# Patient Record
Sex: Male | Born: 1945 | Race: White | Hispanic: No | Smoking: Former smoker
Health system: Southern US, Community
[De-identification: ages and names within clinical notes are randomized; demographics above are authoritative.]

## PROBLEM LIST (undated history)

## (undated) DIAGNOSIS — K219 Gastro-esophageal reflux disease without esophagitis: Secondary | ICD-10-CM

## (undated) DIAGNOSIS — R001 Bradycardia, unspecified: Secondary | ICD-10-CM

## (undated) DIAGNOSIS — Z87442 Personal history of urinary calculi: Secondary | ICD-10-CM

## (undated) DIAGNOSIS — N200 Calculus of kidney: Secondary | ICD-10-CM

## (undated) DIAGNOSIS — F419 Anxiety disorder, unspecified: Secondary | ICD-10-CM

## (undated) DIAGNOSIS — I10 Essential (primary) hypertension: Secondary | ICD-10-CM

## (undated) DIAGNOSIS — E785 Hyperlipidemia, unspecified: Secondary | ICD-10-CM

## (undated) DIAGNOSIS — G629 Polyneuropathy, unspecified: Secondary | ICD-10-CM

## (undated) HISTORY — PX: INSERT / REPLACE / REMOVE PACEMAKER: SUR710

## (undated) HISTORY — DX: Calculus of kidney: N20.0

## (undated) HISTORY — PX: BACK SURGERY: SHX140

## (undated) HISTORY — PX: OTHER SURGICAL HISTORY: SHX169

## (undated) HISTORY — PX: JOINT REPLACEMENT: SHX530

## (undated) HISTORY — DX: Hyperlipidemia, unspecified: E78.5

## (undated) HISTORY — PX: EYE SURGERY: SHX253

---

## 1992-03-29 HISTORY — PX: APPENDECTOMY: SHX54

## 2005-12-03 ENCOUNTER — Emergency Department: Payer: Self-pay | Admitting: Emergency Medicine

## 2008-03-29 HISTORY — PX: SIGMOIDOSCOPY: SUR1295

## 2009-06-26 DIAGNOSIS — K219 Gastro-esophageal reflux disease without esophagitis: Secondary | ICD-10-CM | POA: Insufficient documentation

## 2009-06-26 DIAGNOSIS — L851 Acquired keratosis [keratoderma] palmaris et plantaris: Secondary | ICD-10-CM | POA: Insufficient documentation

## 2009-06-26 DIAGNOSIS — K644 Residual hemorrhoidal skin tags: Secondary | ICD-10-CM | POA: Insufficient documentation

## 2010-03-29 HISTORY — PX: CARPAL TUNNEL RELEASE: SHX101

## 2010-06-24 ENCOUNTER — Ambulatory Visit: Payer: Self-pay | Admitting: Specialist

## 2012-12-06 ENCOUNTER — Ambulatory Visit: Payer: Self-pay | Admitting: Gastroenterology

## 2012-12-06 LAB — HM COLONOSCOPY

## 2014-04-01 DIAGNOSIS — E780 Pure hypercholesterolemia: Secondary | ICD-10-CM | POA: Diagnosis not present

## 2014-04-01 DIAGNOSIS — Z0000 Encounter for general adult medical examination without abnormal findings: Secondary | ICD-10-CM | POA: Diagnosis not present

## 2014-04-01 DIAGNOSIS — Z23 Encounter for immunization: Secondary | ICD-10-CM | POA: Diagnosis not present

## 2014-04-01 DIAGNOSIS — Z125 Encounter for screening for malignant neoplasm of prostate: Secondary | ICD-10-CM | POA: Diagnosis not present

## 2014-04-25 DIAGNOSIS — M9903 Segmental and somatic dysfunction of lumbar region: Secondary | ICD-10-CM | POA: Diagnosis not present

## 2014-04-25 DIAGNOSIS — M5416 Radiculopathy, lumbar region: Secondary | ICD-10-CM | POA: Diagnosis not present

## 2014-04-25 DIAGNOSIS — M9905 Segmental and somatic dysfunction of pelvic region: Secondary | ICD-10-CM | POA: Diagnosis not present

## 2014-04-25 DIAGNOSIS — M955 Acquired deformity of pelvis: Secondary | ICD-10-CM | POA: Diagnosis not present

## 2014-04-26 DIAGNOSIS — M9905 Segmental and somatic dysfunction of pelvic region: Secondary | ICD-10-CM | POA: Diagnosis not present

## 2014-04-26 DIAGNOSIS — M9903 Segmental and somatic dysfunction of lumbar region: Secondary | ICD-10-CM | POA: Diagnosis not present

## 2014-04-26 DIAGNOSIS — M955 Acquired deformity of pelvis: Secondary | ICD-10-CM | POA: Diagnosis not present

## 2014-04-26 DIAGNOSIS — M5416 Radiculopathy, lumbar region: Secondary | ICD-10-CM | POA: Diagnosis not present

## 2014-04-27 DIAGNOSIS — M9905 Segmental and somatic dysfunction of pelvic region: Secondary | ICD-10-CM | POA: Diagnosis not present

## 2014-04-27 DIAGNOSIS — M9903 Segmental and somatic dysfunction of lumbar region: Secondary | ICD-10-CM | POA: Diagnosis not present

## 2014-04-27 DIAGNOSIS — M955 Acquired deformity of pelvis: Secondary | ICD-10-CM | POA: Diagnosis not present

## 2014-04-27 DIAGNOSIS — M5416 Radiculopathy, lumbar region: Secondary | ICD-10-CM | POA: Diagnosis not present

## 2014-04-29 DIAGNOSIS — M9903 Segmental and somatic dysfunction of lumbar region: Secondary | ICD-10-CM | POA: Diagnosis not present

## 2014-04-29 DIAGNOSIS — M5416 Radiculopathy, lumbar region: Secondary | ICD-10-CM | POA: Diagnosis not present

## 2014-04-29 DIAGNOSIS — M9905 Segmental and somatic dysfunction of pelvic region: Secondary | ICD-10-CM | POA: Diagnosis not present

## 2014-04-29 DIAGNOSIS — M955 Acquired deformity of pelvis: Secondary | ICD-10-CM | POA: Diagnosis not present

## 2014-05-01 DIAGNOSIS — M9903 Segmental and somatic dysfunction of lumbar region: Secondary | ICD-10-CM | POA: Diagnosis not present

## 2014-05-01 DIAGNOSIS — M955 Acquired deformity of pelvis: Secondary | ICD-10-CM | POA: Diagnosis not present

## 2014-05-01 DIAGNOSIS — M9905 Segmental and somatic dysfunction of pelvic region: Secondary | ICD-10-CM | POA: Diagnosis not present

## 2014-05-01 DIAGNOSIS — M5416 Radiculopathy, lumbar region: Secondary | ICD-10-CM | POA: Diagnosis not present

## 2014-05-03 DIAGNOSIS — M9903 Segmental and somatic dysfunction of lumbar region: Secondary | ICD-10-CM | POA: Diagnosis not present

## 2014-05-03 DIAGNOSIS — M5416 Radiculopathy, lumbar region: Secondary | ICD-10-CM | POA: Diagnosis not present

## 2014-05-03 DIAGNOSIS — M955 Acquired deformity of pelvis: Secondary | ICD-10-CM | POA: Diagnosis not present

## 2014-05-03 DIAGNOSIS — M9905 Segmental and somatic dysfunction of pelvic region: Secondary | ICD-10-CM | POA: Diagnosis not present

## 2014-05-08 DIAGNOSIS — M9903 Segmental and somatic dysfunction of lumbar region: Secondary | ICD-10-CM | POA: Diagnosis not present

## 2014-05-08 DIAGNOSIS — M9905 Segmental and somatic dysfunction of pelvic region: Secondary | ICD-10-CM | POA: Diagnosis not present

## 2014-05-08 DIAGNOSIS — M955 Acquired deformity of pelvis: Secondary | ICD-10-CM | POA: Diagnosis not present

## 2014-05-08 DIAGNOSIS — M5416 Radiculopathy, lumbar region: Secondary | ICD-10-CM | POA: Diagnosis not present

## 2014-05-09 DIAGNOSIS — M955 Acquired deformity of pelvis: Secondary | ICD-10-CM | POA: Diagnosis not present

## 2014-05-09 DIAGNOSIS — M9905 Segmental and somatic dysfunction of pelvic region: Secondary | ICD-10-CM | POA: Diagnosis not present

## 2014-05-09 DIAGNOSIS — M9903 Segmental and somatic dysfunction of lumbar region: Secondary | ICD-10-CM | POA: Diagnosis not present

## 2014-05-09 DIAGNOSIS — M5416 Radiculopathy, lumbar region: Secondary | ICD-10-CM | POA: Diagnosis not present

## 2014-05-10 DIAGNOSIS — M955 Acquired deformity of pelvis: Secondary | ICD-10-CM | POA: Diagnosis not present

## 2014-05-10 DIAGNOSIS — M9905 Segmental and somatic dysfunction of pelvic region: Secondary | ICD-10-CM | POA: Diagnosis not present

## 2014-05-10 DIAGNOSIS — M9903 Segmental and somatic dysfunction of lumbar region: Secondary | ICD-10-CM | POA: Diagnosis not present

## 2014-05-10 DIAGNOSIS — M5416 Radiculopathy, lumbar region: Secondary | ICD-10-CM | POA: Diagnosis not present

## 2014-05-14 DIAGNOSIS — M9903 Segmental and somatic dysfunction of lumbar region: Secondary | ICD-10-CM | POA: Diagnosis not present

## 2014-05-14 DIAGNOSIS — M9905 Segmental and somatic dysfunction of pelvic region: Secondary | ICD-10-CM | POA: Diagnosis not present

## 2014-05-14 DIAGNOSIS — M5416 Radiculopathy, lumbar region: Secondary | ICD-10-CM | POA: Diagnosis not present

## 2014-05-14 DIAGNOSIS — M955 Acquired deformity of pelvis: Secondary | ICD-10-CM | POA: Diagnosis not present

## 2014-05-16 DIAGNOSIS — M9903 Segmental and somatic dysfunction of lumbar region: Secondary | ICD-10-CM | POA: Diagnosis not present

## 2014-05-16 DIAGNOSIS — M955 Acquired deformity of pelvis: Secondary | ICD-10-CM | POA: Diagnosis not present

## 2014-05-16 DIAGNOSIS — M5416 Radiculopathy, lumbar region: Secondary | ICD-10-CM | POA: Diagnosis not present

## 2014-05-16 DIAGNOSIS — M9905 Segmental and somatic dysfunction of pelvic region: Secondary | ICD-10-CM | POA: Diagnosis not present

## 2014-05-21 DIAGNOSIS — M5416 Radiculopathy, lumbar region: Secondary | ICD-10-CM | POA: Diagnosis not present

## 2014-05-21 DIAGNOSIS — M955 Acquired deformity of pelvis: Secondary | ICD-10-CM | POA: Diagnosis not present

## 2014-05-21 DIAGNOSIS — M9905 Segmental and somatic dysfunction of pelvic region: Secondary | ICD-10-CM | POA: Diagnosis not present

## 2014-05-21 DIAGNOSIS — M9903 Segmental and somatic dysfunction of lumbar region: Secondary | ICD-10-CM | POA: Diagnosis not present

## 2014-05-23 DIAGNOSIS — M5416 Radiculopathy, lumbar region: Secondary | ICD-10-CM | POA: Diagnosis not present

## 2014-05-23 DIAGNOSIS — M955 Acquired deformity of pelvis: Secondary | ICD-10-CM | POA: Diagnosis not present

## 2014-05-23 DIAGNOSIS — M9903 Segmental and somatic dysfunction of lumbar region: Secondary | ICD-10-CM | POA: Diagnosis not present

## 2014-05-23 DIAGNOSIS — M9905 Segmental and somatic dysfunction of pelvic region: Secondary | ICD-10-CM | POA: Diagnosis not present

## 2014-07-04 DIAGNOSIS — E780 Pure hypercholesterolemia: Secondary | ICD-10-CM | POA: Diagnosis not present

## 2014-07-04 DIAGNOSIS — Z23 Encounter for immunization: Secondary | ICD-10-CM | POA: Diagnosis not present

## 2014-07-04 DIAGNOSIS — R7309 Other abnormal glucose: Secondary | ICD-10-CM | POA: Diagnosis not present

## 2014-07-05 DIAGNOSIS — R7309 Other abnormal glucose: Secondary | ICD-10-CM | POA: Diagnosis not present

## 2014-07-05 DIAGNOSIS — E780 Pure hypercholesterolemia: Secondary | ICD-10-CM | POA: Diagnosis not present

## 2014-07-05 LAB — BASIC METABOLIC PANEL
BUN: 12 mg/dL (ref 4–21)
CREATININE: 0.8 mg/dL (ref 0.6–1.3)
Glucose: 99 mg/dL
Potassium: 4.6 mmol/L (ref 3.4–5.3)
Sodium: 142 mmol/L (ref 137–147)

## 2014-07-05 LAB — HEPATIC FUNCTION PANEL
ALK PHOS: 104 U/L (ref 25–125)
ALT: 32 U/L (ref 10–40)
AST: 33 U/L (ref 14–40)
BILIRUBIN, TOTAL: 0.4 mg/dL

## 2014-07-05 LAB — CBC AND DIFFERENTIAL
HEMATOCRIT: 44 % (ref 41–53)
HEMOGLOBIN: 15.6 g/dL (ref 13.5–17.5)
Neutrophils Absolute: 60 /uL
Platelets: 224 10*3/uL (ref 150–399)
WBC: 5.2 10*3/mL

## 2014-07-05 LAB — LIPID PANEL
Cholesterol: 153 mg/dL (ref 0–200)
HDL: 52 mg/dL (ref 35–70)
LDL Cholesterol: 86 mg/dL
TRIGLYCERIDES: 76 mg/dL (ref 40–160)

## 2014-07-05 LAB — HEMOGLOBIN A1C: HEMOGLOBIN A1C: 5.4 % (ref 4.0–6.0)

## 2014-09-25 ENCOUNTER — Telehealth: Payer: Self-pay | Admitting: Family Medicine

## 2014-09-25 NOTE — Telephone Encounter (Signed)
Pt. Wants a referral to see Dr. Evorn Gong Pt. States he has some sun spots on top of head that needs to be looked at.  CB# [phone] CC

## 2014-09-26 NOTE — Telephone Encounter (Signed)
Schedule appointment to dermatologist (Dr. Evorn Gong) to evaluate lesions on scalp. If he has seen Dr. Evorn Gong in the past, he probably could schedule check up by himself.

## 2014-10-01 NOTE — Telephone Encounter (Signed)
Auth # [id number] expires 04-05-15

## 2014-10-07 DIAGNOSIS — L821 Other seborrheic keratosis: Secondary | ICD-10-CM | POA: Diagnosis not present

## 2014-10-07 DIAGNOSIS — Z85828 Personal history of other malignant neoplasm of skin: Secondary | ICD-10-CM | POA: Diagnosis not present

## 2014-10-07 DIAGNOSIS — L570 Actinic keratosis: Secondary | ICD-10-CM | POA: Diagnosis not present

## 2014-10-07 DIAGNOSIS — D485 Neoplasm of uncertain behavior of skin: Secondary | ICD-10-CM | POA: Diagnosis not present

## 2014-10-07 DIAGNOSIS — X32XXXA Exposure to sunlight, initial encounter: Secondary | ICD-10-CM | POA: Diagnosis not present

## 2014-10-07 DIAGNOSIS — L820 Inflamed seborrheic keratosis: Secondary | ICD-10-CM | POA: Diagnosis not present

## 2015-02-07 ENCOUNTER — Other Ambulatory Visit: Payer: Self-pay

## 2015-02-07 MED ORDER — BUSPIRONE HCL 15 MG PO TABS: 15.0000 mg | ORAL_TABLET | Freq: Two times a day (BID) | ORAL | Status: DC

## 2015-02-07 MED ORDER — RANITIDINE HCL 150 MG PO TABS: 150.0000 mg | ORAL_TABLET | Freq: Two times a day (BID) | ORAL | Status: DC

## 2015-02-07 MED ORDER — SIMVASTATIN 20 MG PO TABS: 20.0000 mg | ORAL_TABLET | Freq: Every day | ORAL | Status: DC

## 2015-02-07 NOTE — Telephone Encounter (Signed)
Fax from [facility] requesting refill, please review-aa (meds pulled down)

## 2015-03-07 DIAGNOSIS — H9190 Unspecified hearing loss, unspecified ear: Secondary | ICD-10-CM | POA: Insufficient documentation

## 2015-03-07 DIAGNOSIS — F32A Depression, unspecified: Secondary | ICD-10-CM | POA: Insufficient documentation

## 2015-03-07 DIAGNOSIS — E7800 Pure hypercholesterolemia, unspecified: Secondary | ICD-10-CM | POA: Insufficient documentation

## 2015-03-07 DIAGNOSIS — F419 Anxiety disorder, unspecified: Secondary | ICD-10-CM | POA: Insufficient documentation

## 2015-03-07 DIAGNOSIS — C4490 Unspecified malignant neoplasm of skin, unspecified: Secondary | ICD-10-CM | POA: Insufficient documentation

## 2015-03-07 DIAGNOSIS — F329 Major depressive disorder, single episode, unspecified: Secondary | ICD-10-CM | POA: Insufficient documentation

## 2015-03-07 DIAGNOSIS — M1990 Unspecified osteoarthritis, unspecified site: Secondary | ICD-10-CM | POA: Insufficient documentation

## 2015-04-02 DIAGNOSIS — Z0100 Encounter for examination of eyes and vision without abnormal findings: Secondary | ICD-10-CM | POA: Diagnosis not present

## 2015-04-03 ENCOUNTER — Encounter: Payer: Self-pay | Admitting: Family Medicine

## 2015-04-03 ENCOUNTER — Ambulatory Visit (INDEPENDENT_AMBULATORY_CARE_PROVIDER_SITE_OTHER): Payer: Commercial Managed Care - HMO | Admitting: Family Medicine

## 2015-04-03 VITALS — BP 130/78 | HR 76 | Temp 97.6°F | Resp 16 | Ht 67.0 in | Wt 199.2 lb

## 2015-04-03 DIAGNOSIS — R351 Nocturia: Secondary | ICD-10-CM

## 2015-04-03 DIAGNOSIS — Z0000 Encounter for general adult medical examination without abnormal findings: Secondary | ICD-10-CM

## 2015-04-03 DIAGNOSIS — E7800 Pure hypercholesterolemia, unspecified: Secondary | ICD-10-CM

## 2015-04-03 DIAGNOSIS — Z23 Encounter for immunization: Secondary | ICD-10-CM

## 2015-04-03 DIAGNOSIS — M170 Bilateral primary osteoarthritis of knee: Secondary | ICD-10-CM | POA: Diagnosis not present

## 2015-04-03 LAB — POCT URINALYSIS DIPSTICK
BILIRUBIN UA: NEGATIVE
Blood, UA: NEGATIVE
Glucose, UA: NEGATIVE
KETONES UA: NEGATIVE
LEUKOCYTES UA: NEGATIVE
Nitrite, UA: NEGATIVE
PROTEIN UA: NEGATIVE
Spec Grav, UA: 1.01
Urobilinogen, UA: 0.2
pH, UA: 6.5

## 2015-04-03 NOTE — Progress Notes (Signed)
Patient ID: Justin Warren, male   DOB: June 21, 1945, 70 y.o.   MRN: [id number] Patient: Justin Warren, Male    DOB: 29-Apr-1945, 70 y.o.   MRN: [id number] Visit Date: 04/03/2015  Today's Provider: Vernie Murders, PA   Chief Complaint  Patient presents with  . Medicare Wellness   Subjective:    Annual wellness visit [name] is a 70 y.o. male who presents today for his Subsequent Annual Wellness Visit. He feels well. He reports exercising none, but plans to start. He reports he is sleeping well.  -----------------------------------------------------------   Family History  Problem Relation Age of Onset  . Dementia Mother   . GER disease Mother   . Hypertension Father   . Arrhythmia Sister   . Heart murmur Sister    Allergies  Allergen Reactions  . Amoxicillin-Pot Clavulanate    Review of Systems  Constitutional: Negative.   HENT: Negative.   Eyes: Negative.   Respiratory: Negative.   Cardiovascular: Negative.   Gastrointestinal: Negative.   Endocrine: Negative.   Genitourinary: Positive for frequency.       Decreased stream and nocturia once a night.  Musculoskeletal: Positive for arthralgias.       Of knees  Skin: Negative.   Allergic/Immunologic: Negative.   Neurological: Negative.   Hematological: Negative.   Psychiatric/Behavioral: Negative.     Social History   Social History  . Marital Status: Married    Spouse Name: N/A  . Number of Children: N/A  . Years of Education: N/A   Occupational History  . Not on file.   Social History Main Topics  . Smoking status: Former Smoker -- 2.00 packs/day for 30 years    Types: Cigarettes  . Smokeless tobacco: Never Used  . Alcohol Use: 0.0 oz/week    0 Standard drinks or equivalent per week     Comment: Occasionally   . Drug Use: No  . Sexual Activity: Not on file   Other Topics Concern  . Not on file   Social History Narrative    Patient Active Problem List   Diagnosis Date Noted  . Anxiety  03/07/2015  . Clinical depression 03/07/2015  . Difficulty hearing 03/07/2015  . Hypercholesteremia 03/07/2015  . 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 Acid reflux 06/26/2009  . External hemorrhoids without complication 32/95/1884  . 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 Carpal tunnel release  2012  . Appendectomy  1994  . Sigmoidoscopy  2010    His family history includes Arrhythmia in his sister; Dementia in his mother; GER disease in his mother; Heart murmur in his sister; Hypertension in his father.    Previous Medications   ASPIRIN 81 MG EC TABLET    Take by mouth.   BUSPIRONE (BUSPAR) 15 MG TABLET    Take 1 tablet (15 mg total) by mouth 2 (two) times daily.   FLUOXETINE (PROZAC) 20 MG CAPSULE       RANITIDINE (ZANTAC) 150 MG TABLET    Take 1 tablet (150 mg total) by mouth 2 (two) times daily.   SIMVASTATIN (ZOCOR) 20 MG TABLET    Take 1 tablet (20 mg total) by mouth daily.    Patient Care Team: Jerrol Banana., MD as PCP - General (Family Medicine)     Objective:   Vitals: BP 130/78 mmHg  Pulse 76  Temp(Src) 97.6 F (36.4 C) (Oral)  Resp 16  Ht 5' 7"  (1.702 m)  Wt 199 lb 3.2 oz (90.357 kg)  BMI 31.19 kg/m2  SpO2 97%  Physical Exam  Constitutional: He is oriented to person, place, and time. He appears well-developed and well-nourished.  HENT:  Head: Normocephalic and atraumatic.  Right Ear: External ear normal.  Left Ear: External ear normal.  Nose: Nose normal.  Mouth/Throat: Oropharynx is clear and moist.  Eyes: Conjunctivae and EOM are normal. Pupils are equal, round, and reactive to light. Right eye exhibits no discharge.  Neck: Normal range of motion. Neck supple. No tracheal deviation present. No thyromegaly present.  Cardiovascular: Normal rate, regular rhythm, normal heart sounds and intact distal pulses.   No murmur heard. Pulmonary/Chest: Effort normal  and breath sounds normal. No respiratory distress. He has no wheezes. He has no rales. He exhibits no tenderness.  Abdominal: Soft. He exhibits no distension and no mass. There is no tenderness. There is no rebound and no guarding.  Genitourinary: Rectum normal, prostate normal and penis normal. Guaiac negative stool.  Musculoskeletal: Normal range of motion. He exhibits no edema or tenderness.  Well healed lower lumbar scar from past laminectomies. Fair ROM throughout. Some crepitus in knees. No edema or redness.  Lymphadenopathy:    He has no cervical adenopathy.  Neurological: He is alert and oriented to person, place, and time. He has normal reflexes. No cranial nerve deficit. He exhibits normal muscle tone. Coordination normal.  Skin: Skin is warm and dry. No rash noted. No erythema.  Psychiatric: He has a normal mood and affect. His behavior is normal. Judgment and thought content normal.    Activities of Daily Living In your present state of health, do you have any difficulty performing the following activities: 04/03/2015  Hearing? N  Vision? N  Difficulty concentrating or making decisions? N  Walking or climbing stairs? N  Dressing or bathing? N  Doing errands, shopping? N    Fall Risk Assessment Fall Risk  04/03/2015  Falls in the past year? No     Depression Screen PHQ 2/9 Scores 04/03/2015  PHQ - 2 Score 0    Cognitive Testing - 6-CIT  Correct? Score   What year is it? yes 0 0 or 4  What month is it? yes 0 0 or 3  Memorize:    [name],  40,  [address],  [place],      What time is it? (within 1 hour) yes 0 0 or 3  Count backwards from 20 yes 0 0, 2, or 4  Name the months of the year yes 0 0, 2, or 4  Repeat name & address above yes 0 0, 2, 4, 6, 8, or 10       TOTAL SCORE  0/28   Interpretation:  Normal  Normal (0-7) Abnormal (8-28)    Assessment & Plan:     Annual Wellness Visit  Reviewed patient's Family Medical History Reviewed and updated list of  patient's medical providers Assessment of cognitive impairment was done Assessed patient's functional ability Established a written schedule for health screening [place] Completed and Reviewed  Exercise Activities and Dietary recommendations Goals    Work in the yard and around home every day. 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      Immunization History  Administered Date(s) Administered  . Influenza-Unspecified 01/18/2015  . Pneumococcal Conjugate-13 04/01/2014    Health Maintenance  Topic Date Due  . Hepatitis  C Screening  1946-01-12  . INFLUENZA VACCINE  10/28/2015  . TETANUS/TDAP  10/30/2016  . COLONOSCOPY  12/07/2022  . ZOSTAVAX  Completed  . PNA vac Low Risk Adult  Completed      Discussed health benefits of physical activity, and encouraged him to engage in regular exercise appropriate for his age and condition.    ------------------------------------------------------------------------------------------------------------  1. Annual physical exam General health stable. Will up date pneumonia vaccine. Continue encourage to walk for exercise. Denies alcohol use, falls in the past year or depressive symptoms. Cognitive function screening normal. - POCT urinalysis dipstick  2. Need for pneumococcal vaccinatio - Pneumococcal polysaccharide vaccine 23-valent greater than or equal to 2yo subcutaneous/IM  3. Primary osteoarthritis of both knees Intermittent aches and pains in both knees. May use Aleve BID prn or Aspercreme with Lidocaine cream. If worsening, will need x-ray evaluation and possible orthopedic referral.  4. Hypercholesteremia Tolerating simvastatin without side effects. Continue low fat diet and get follow up labs. - COMPLETE METABOLIC PANEL WITH GFR - Lipid panel  5. Nocturia Occasional nocturia. Will recheck labs and consider Flomax if worsening. - CBC with Differential/Platelet - PSA

## 2015-04-03 NOTE — Patient Instructions (Signed)
Benign Prostatic Hyperplasia An enlarged prostate (benign prostatic hyperplasia) is common in older men. You may experience the following:  Weak urine stream.  Dribbling.  Feeling like the bladder has not emptied completely.  Difficulty starting urination.  Getting up frequently at night to urinate.  Urinating more frequently during the day. HOME CARE INSTRUCTIONS  Monitor your prostatic hyperplasia for any changes. The following actions may help to alleviate any discomfort you are experiencing:  Give yourself time when you urinate.  Stay away from alcohol.  Avoid beverages containing caffeine, such as coffee, tea, and colas, because they can make the problem worse.  Avoid decongestants, antihistamines, and some prescription medicines that can make the problem worse.  Follow up with your health care provider for further treatment as recommended. SEEK MEDICAL CARE IF:  You are experiencing progressive difficulty voiding.  Your urine stream is progressively getting narrower.  You are awaking from sleep with the urge to void more frequently.  You are constantly feeling the need to void.  You experience loss of urine, especially in small amounts. SEEK IMMEDIATE MEDICAL CARE IF:   You develop increased pain with urination or are unable to urinate.  You develop severe abdominal pain, vomiting, a high fever, or fainting.  You develop back pain or blood in your urine. MAKE SURE YOU:   Understand these instructions.  Will watch your condition.  Will get help right away if you are not doing well or get worse.   This information is not intended to replace advice given to you by your health care provider. Make sure you discuss any questions you have with your health care provider.   Document Released: 03/15/2005 Document Revised: 04/05/2014 Document Reviewed: 08/15/2012 Elsevier Interactive Patient Education [employer].

## 2015-04-08 ENCOUNTER — Encounter: Payer: Self-pay | Admitting: Family Medicine

## 2015-04-14 DIAGNOSIS — E7800 Pure hypercholesterolemia, unspecified: Secondary | ICD-10-CM | POA: Diagnosis not present

## 2015-04-14 DIAGNOSIS — R351 Nocturia: Secondary | ICD-10-CM | POA: Diagnosis not present

## 2015-04-15 LAB — LIPID PANEL
CHOL/HDL RATIO: 3.5 ratio (ref 0.0–5.0)
CHOLESTEROL TOTAL: 148 mg/dL (ref 100–199)
HDL: 42 mg/dL (ref >39)
LDL Calculated: 78 mg/dL (ref 0–99)
TRIGLYCERIDES: 141 mg/dL (ref 0–149)
VLDL Cholesterol Cal: 28 mg/dL (ref 5–40)

## 2015-04-15 LAB — CBC WITH DIFFERENTIAL/PLATELET
BASOS ABS: 0.1 10*3/uL (ref 0.0–0.2)
Basos: 1 %
EOS (ABSOLUTE): 0.1 10*3/uL (ref 0.0–0.4)
Eos: 2 %
Hematocrit: 45.2 % (ref 37.5–51.0)
Hemoglobin: 15.8 g/dL (ref 12.6–17.7)
Immature Grans (Abs): 0 10*3/uL (ref 0.0–0.1)
Immature Granulocytes: 0 %
LYMPHS ABS: 1.5 10*3/uL (ref 0.7–3.1)
Lymphs: 30 %
MCH: 28.8 pg (ref 26.6–33.0)
MCHC: 35 g/dL (ref 31.5–35.7)
MCV: 82 fL (ref 79–97)
MONOS ABS: 0.5 10*3/uL (ref 0.1–0.9)
Monocytes: 10 %
Neutrophils Absolute: 2.9 10*3/uL (ref 1.4–7.0)
Neutrophils: 57 %
Platelets: 231 10*3/uL (ref 150–379)
RBC: 5.49 x10E6/uL (ref 4.14–5.80)
RDW: 14.2 % (ref 12.3–15.4)
WBC: 5 10*3/uL (ref 3.4–10.8)

## 2015-04-15 LAB — PSA: Prostate Specific Ag, Serum: 0.6 ng/mL (ref 0.0–4.0)

## 2015-04-17 ENCOUNTER — Telehealth: Payer: Self-pay

## 2015-04-17 NOTE — Telephone Encounter (Signed)
-----   Message from Margo Common, [place] sent at 04/16/2015  8:21 PM EST ----- All blood tests in good shape. Continue present medication and diet. Encourage to exercise 3-4 days a week and recheck annually.

## 2015-04-17 NOTE — Telephone Encounter (Signed)
Left message to call back  

## 2015-04-22 NOTE — Telephone Encounter (Signed)
Pt advised.   Thanks,   -Laura  

## 2015-04-23 ENCOUNTER — Other Ambulatory Visit: Payer: Self-pay | Admitting: Family Medicine

## 2015-04-23 DIAGNOSIS — F329 Major depressive disorder, single episode, unspecified: Secondary | ICD-10-CM

## 2015-04-23 DIAGNOSIS — F32A Depression, unspecified: Secondary | ICD-10-CM

## 2015-06-09 ENCOUNTER — Telehealth: Payer: Self-pay | Admitting: Family Medicine

## 2015-06-09 NOTE — Telephone Encounter (Signed)
Pt would like a referral for his back pain. Pt stated it was discussed at his CPE appt on 04/03/15. Pt was advised that if referral couldn't be done based on visit on 04/03/15 he would have to come in for an OV. Thanks TNP

## 2015-06-09 NOTE — Telephone Encounter (Signed)
Please review

## 2015-06-09 NOTE — Telephone Encounter (Signed)
Should schedule appointment to evaluate situation and document for possible referral. May need to schedule x-rays or scan prior to referral to decide on need for neurosurgeon versus orthopedist.

## 2015-06-10 DIAGNOSIS — L570 Actinic keratosis: Secondary | ICD-10-CM | POA: Diagnosis not present

## 2015-06-10 DIAGNOSIS — D485 Neoplasm of uncertain behavior of skin: Secondary | ICD-10-CM | POA: Diagnosis not present

## 2015-06-10 DIAGNOSIS — C44519 Basal cell carcinoma of skin of other part of trunk: Secondary | ICD-10-CM | POA: Diagnosis not present

## 2015-06-10 DIAGNOSIS — Z85828 Personal history of other malignant neoplasm of skin: Secondary | ICD-10-CM | POA: Diagnosis not present

## 2015-06-10 DIAGNOSIS — C44612 Basal cell carcinoma of skin of right upper limb, including shoulder: Secondary | ICD-10-CM | POA: Diagnosis not present

## 2015-06-10 DIAGNOSIS — Z08 Encounter for follow-up examination after completed treatment for malignant neoplasm: Secondary | ICD-10-CM | POA: Diagnosis not present

## 2015-06-10 NOTE — Telephone Encounter (Signed)
LMTCB

## 2015-06-10 NOTE — Telephone Encounter (Signed)
Patient has been advised, will schedule appt. KW

## 2015-06-16 ENCOUNTER — Ambulatory Visit: Payer: Commercial Managed Care - HMO | Admitting: Family Medicine

## 2015-06-17 ENCOUNTER — Ambulatory Visit
Admission: RE | Admit: 2015-06-17 | Discharge: 2015-06-17 | Disposition: A | Discharge status: Home / Self Care | Payer: Commercial Managed Care - HMO | Source: Ambulatory Visit | Attending: Family Medicine | Admitting: Family Medicine

## 2015-06-17 ENCOUNTER — Ambulatory Visit (INDEPENDENT_AMBULATORY_CARE_PROVIDER_SITE_OTHER): Payer: Commercial Managed Care - HMO | Admitting: Family Medicine

## 2015-06-17 ENCOUNTER — Encounter: Payer: Self-pay | Admitting: Family Medicine

## 2015-06-17 VITALS — BP 132/74 | HR 76 | Temp 98.0°F | Resp 16 | Wt 201.6 lb

## 2015-06-17 DIAGNOSIS — M5417 Radiculopathy, lumbosacral region: Secondary | ICD-10-CM

## 2015-06-17 DIAGNOSIS — Z9889 Other specified postprocedural states: Secondary | ICD-10-CM

## 2015-06-17 DIAGNOSIS — M5416 Radiculopathy, lumbar region: Secondary | ICD-10-CM

## 2015-06-17 DIAGNOSIS — M47816 Spondylosis without myelopathy or radiculopathy, lumbar region: Secondary | ICD-10-CM | POA: Diagnosis not present

## 2015-06-17 MED ORDER — DICLOFENAC SODIUM 75 MG PO TBEC
75.0000 mg | DELAYED_RELEASE_TABLET | Freq: Two times a day (BID) | ORAL | Status: DC
Start: 2015-06-17 — End: 2015-06-17

## 2015-06-17 MED ORDER — DICLOFENAC SODIUM 75 MG PO TBEC: 75.0000 mg | DELAYED_RELEASE_TABLET | Freq: Two times a day (BID) | ORAL | Status: DC

## 2015-06-17 NOTE — Progress Notes (Signed)
Patient ID: Justin Warren, male   DOB: 01/03/1946, 70 y.o.   MRN: [id number]   Patient: Justin Warren Male    DOB: 13-Sep-1945   70 y.o.   MRN: [id number] Visit Date: 06/17/2015  Today's Provider: Vernie Murders, PA   Chief Complaint  Patient presents with  . Back Pain   Subjective:    Back Pain This is a recurrent problem. The current episode started more than 1 month ago. The problem occurs constantly. The problem is unchanged. The pain is present in the lumbar spine. The quality of the pain is described as shooting. The pain radiates to the right thigh, right knee and right foot. The pain is at a severity of 6/10. The pain is the same all the time. Associated symptoms include leg pain and numbness. He has tried NSAIDs (Ibuprofen) for the symptoms. The treatment provided mild relief.   Patient Active Problem List   Diagnosis Date Noted  . Anxiety 03/07/2015  . Clinical depression 03/07/2015  . Difficulty hearing 03/07/2015  . Hypercholesteremia 03/07/2015  . 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 Acid reflux 06/26/2009  . External hemorrhoids without complication 18/84/1660  . 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 Carpal tunnel release  2012  . Appendectomy  1994  . Sigmoidoscopy  2010  . Lumbar laminectomies  1976 & 1978    Dr. [name]   Family History  Problem Relation Age of Onset  . Dementia Mother   . GER disease Mother   . Hypertension Father   . Arrhythmia Sister   . Heart murmur Sister    Previous Medications   ASPIRIN 81 MG EC TABLET    Take by mouth.   BUSPIRONE (BUSPAR) 15 MG TABLET    Take 1 tablet (15 mg total) by mouth 2 (two) times daily.   FLUOXETINE (PROZAC) 20 MG CAPSULE    TAKE 1 CAPSULE EVERY DAY   RANITIDINE (ZANTAC) 150 MG TABLET    Take 1 tablet (150 mg total) by mouth 2 (two) times daily.   SIMVASTATIN (ZOCOR) 20 MG TABLET    Take 1 tablet (20 mg total) by  mouth daily.   Allergies  Allergen Reactions  . Amoxicillin-Pot Clavulanate     Review of Systems  Constitutional: Negative.   HENT: Negative.   Eyes: Negative.   Respiratory: Negative.   Cardiovascular: Negative.   Gastrointestinal: Negative.   Endocrine: Negative.   Genitourinary: Negative.   Musculoskeletal: Positive for back pain.  Skin: Negative.   Allergic/Immunologic: Negative.   Neurological: Positive for numbness.  Hematological: Negative.   Psychiatric/Behavioral: Negative.     Social History  Substance Use Topics  . Smoking status: Former Smoker -- 2.00 packs/day for 30 years    Types: Cigarettes  . Smokeless tobacco: Never Used  . Alcohol Use: 0.0 oz/week    0 Standard drinks or equivalent per week     Comment: Occasionally    Objective:   BP 132/74 mmHg  Pulse 76  Temp(Src) 98 F (36.7 C) (Oral)  Resp 16  Wt 201 lb 9.6 oz (91.445 kg)  Physical Exam  Constitutional: He is oriented to person, place, and time. He appears well-developed and well-nourished. No distress.  HENT:  Head: Normocephalic and atraumatic.  Right Ear: Hearing normal.  Left Ear: Hearing normal.  Nose: Nose normal.  Eyes: Conjunctivae and lids are normal. Right eye exhibits no discharge. Left eye exhibits no discharge.  No scleral icterus.  Pulmonary/Chest: Effort normal. No respiratory distress.  Musculoskeletal:  Stiff lumbar spine. No hyperextension abilities. Fair flexion. Good muscle strength. Unable to raise toes against resistance. SLR's 90 degrees without pain.  Neurological: He is alert and oriented to person, place, and time.  All DTR's absent in lower extremities. Loss of sensation in right lateral calf. Pain from back radiates down back of thigh, across lower leg to top of foot.  Skin: Skin is intact. No lesion and no rash noted.  Psychiatric: He has a normal mood and affect. His speech is normal and behavior is normal. Thought content normal.      Assessment & Plan:       1. Lumbar back pain with radiculopathy affecting right lower extremity Recurrence of back pains intermittently with radiation to right leg and foot similar to episodes prior to laminectomies in 1976 and 1978. Will give NSAID and get x-ray evaluation. May need referral to neurosurgeon for evaluation if no better in a couple weeks or signs of new changes on x-ray. - DG Lumbar Spine Complete - diclofenac (VOLTAREN) 75 MG EC tablet; Take 1 tablet (75 mg total) by mouth 2 (two) times daily.  Dispense: 30 tablet; Refill: 0  2. History of lumbar laminectomy Dr. [name] in [place] did lumbar laminectomies in 1976 and 1978. Some residual weakness in both feet to dorsiflex great toes. Unable to walk on tip toes or walk on heels. May need recheck with new neurosurgeon pending treatment with NSAID and x-ray of L-S spine.

## 2015-06-18 ENCOUNTER — Telehealth: Payer: Self-pay

## 2015-06-18 NOTE — Telephone Encounter (Signed)
-----   Message from Margo Common, [place] sent at 06/17/2015  5:49 PM EDT ----- Back x-rays show degenerative/arthritic changes in lumbar spine and both hips. If no help from Diclofenac over the next 10-14 days, will need to consider need for MRI and referral to spine specialist/neurosurgeon for further evaluation.

## 2015-06-18 NOTE — Telephone Encounter (Signed)
Patient advised as directed below. Patient verbalized understanding and agrees with plan of care.

## 2015-06-18 NOTE — Telephone Encounter (Signed)
LMTCB

## 2015-07-08 ENCOUNTER — Other Ambulatory Visit: Payer: Self-pay | Admitting: Family Medicine

## 2015-07-08 DIAGNOSIS — M5416 Radiculopathy, lumbar region: Secondary | ICD-10-CM

## 2015-07-08 MED ORDER — DICLOFENAC SODIUM 75 MG PO TBEC: 75.0000 mg | DELAYED_RELEASE_TABLET | Freq: Two times a day (BID) | ORAL | Status: DC

## 2015-07-08 NOTE — Telephone Encounter (Signed)
Pt contacted office for refill request on the following medications:  diclofenac (VOLTAREN) 75 MG EC tablet.  90 day supply.  [facility] mail order.  CB#[phone]

## 2015-07-08 NOTE — Telephone Encounter (Signed)
Advise patient refill of Voltaren (diclofenac) was sent to [facility] and he should schedule recheck in 3 months (sooner if needed).

## 2015-07-08 NOTE — Telephone Encounter (Signed)
Left patient a message on home voicemail advising him that RX has been sent to pharmacy and to schedule follow up appointment.

## 2015-07-09 ENCOUNTER — Other Ambulatory Visit: Payer: Self-pay | Admitting: Family Medicine

## 2015-08-13 DIAGNOSIS — C4441 Basal cell carcinoma of skin of scalp and neck: Secondary | ICD-10-CM | POA: Diagnosis not present

## 2015-08-27 DIAGNOSIS — C44519 Basal cell carcinoma of skin of other part of trunk: Secondary | ICD-10-CM | POA: Diagnosis not present

## 2015-09-08 ENCOUNTER — Other Ambulatory Visit: Payer: Self-pay | Admitting: Family Medicine

## 2015-10-02 ENCOUNTER — Other Ambulatory Visit: Payer: Self-pay | Admitting: Family Medicine

## 2015-11-14 ENCOUNTER — Telehealth: Payer: Self-pay | Admitting: Family Medicine

## 2015-11-14 NOTE — Telephone Encounter (Signed)
Pt called saying he does not need a refill now on his generic Zantac 150 mg. But when he does he wants the RX to state that he takes this 2 times a day.  So instead of him get 90 tablets every 90 days he wants to get 180 tablets every 90 days.   He says he always runs short.  He uses [facility].  His call  Back is [phone]  Thanks, Con Memos

## 2015-12-05 DIAGNOSIS — Z85828 Personal history of other malignant neoplasm of skin: Secondary | ICD-10-CM | POA: Diagnosis not present

## 2015-12-05 DIAGNOSIS — B078 Other viral warts: Secondary | ICD-10-CM | POA: Diagnosis not present

## 2015-12-05 DIAGNOSIS — C44329 Squamous cell carcinoma of skin of other parts of face: Secondary | ICD-10-CM | POA: Diagnosis not present

## 2015-12-05 DIAGNOSIS — L570 Actinic keratosis: Secondary | ICD-10-CM | POA: Diagnosis not present

## 2015-12-05 DIAGNOSIS — X32XXXA Exposure to sunlight, initial encounter: Secondary | ICD-10-CM | POA: Diagnosis not present

## 2015-12-05 DIAGNOSIS — D485 Neoplasm of uncertain behavior of skin: Secondary | ICD-10-CM | POA: Diagnosis not present

## 2015-12-25 DIAGNOSIS — C44329 Squamous cell carcinoma of skin of other parts of face: Secondary | ICD-10-CM | POA: Diagnosis not present

## 2015-12-25 DIAGNOSIS — X32XXXA Exposure to sunlight, initial encounter: Secondary | ICD-10-CM | POA: Diagnosis not present

## 2015-12-25 DIAGNOSIS — L570 Actinic keratosis: Secondary | ICD-10-CM | POA: Diagnosis not present

## 2016-01-15 ENCOUNTER — Other Ambulatory Visit: Payer: Self-pay | Admitting: Family Medicine

## 2016-01-19 ENCOUNTER — Telehealth: Payer: Self-pay | Admitting: Family Medicine

## 2016-01-19 DIAGNOSIS — F419 Anxiety disorder, unspecified: Secondary | ICD-10-CM

## 2016-01-19 MED ORDER — BUSPIRONE HCL 15 MG PO TABS: 15.0000 mg | ORAL_TABLET | Freq: Two times a day (BID) | ORAL | 0 refills | Status: DC

## 2016-01-19 MED ORDER — FLUOXETINE HCL 20 MG PO CAPS: 20.0000 mg | ORAL_CAPSULE | Freq: Every day | ORAL | 0 refills | Status: DC

## 2016-01-19 NOTE — Telephone Encounter (Signed)
Please review-aa 

## 2016-01-19 NOTE — Telephone Encounter (Signed)
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 Thank you

## 2016-01-19 NOTE — Telephone Encounter (Signed)
Wife called back saying they need the medication today asap because the pharmacy closes early there.  teri

## 2016-01-19 NOTE — Telephone Encounter (Signed)
It looks like you see this patient. Please review-aa

## 2016-01-19 NOTE — Telephone Encounter (Signed)
Sent 10 day supply of both Fluoxetine and Buspirone to the pharmacy requested.

## 2016-01-19 NOTE — Telephone Encounter (Signed)
Pt's wife called saying [place] are at the beach and he left his medication at home and needs a 5 day refill.  [id number]  Fluoxetine 240m They cant it sent to [facility] [address] [phone]'s call back is [phone]  Thank sTeri

## 2016-01-19 NOTE — Telephone Encounter (Signed)
RX sent to pharmacy. Attempted to contact patient. No answer and voicemail has not been set up.

## 2016-02-24 ENCOUNTER — Other Ambulatory Visit: Payer: Self-pay | Admitting: Family Medicine

## 2016-02-24 DIAGNOSIS — F419 Anxiety disorder, unspecified: Secondary | ICD-10-CM

## 2016-02-24 NOTE — Telephone Encounter (Signed)
Has appointment scheduled on 04/10/2015. Renaldo Fiddler, CMA

## 2016-04-05 ENCOUNTER — Encounter: Payer: Commercial Managed Care - HMO | Admitting: Family Medicine

## 2016-04-09 ENCOUNTER — Ambulatory Visit (INDEPENDENT_AMBULATORY_CARE_PROVIDER_SITE_OTHER): Payer: Commercial Managed Care - HMO | Admitting: Family Medicine

## 2016-04-09 ENCOUNTER — Ambulatory Visit: Payer: Commercial Managed Care - HMO

## 2016-04-09 ENCOUNTER — Encounter: Payer: Self-pay | Admitting: Family Medicine

## 2016-04-09 VITALS — BP 130/78 | HR 78 | Temp 98.7°F | Resp 16 | Ht 67.25 in | Wt 199.2 lb

## 2016-04-09 DIAGNOSIS — Z9889 Other specified postprocedural states: Secondary | ICD-10-CM

## 2016-04-09 DIAGNOSIS — F419 Anxiety disorder, unspecified: Secondary | ICD-10-CM

## 2016-04-09 DIAGNOSIS — E7800 Pure hypercholesterolemia, unspecified: Secondary | ICD-10-CM

## 2016-04-09 DIAGNOSIS — Z0000 Encounter for general adult medical examination without abnormal findings: Secondary | ICD-10-CM

## 2016-04-09 DIAGNOSIS — Z125 Encounter for screening for malignant neoplasm of prostate: Secondary | ICD-10-CM | POA: Diagnosis not present

## 2016-04-09 DIAGNOSIS — M5417 Radiculopathy, lumbosacral region: Secondary | ICD-10-CM | POA: Diagnosis not present

## 2016-04-09 DIAGNOSIS — K219 Gastro-esophageal reflux disease without esophagitis: Secondary | ICD-10-CM | POA: Diagnosis not present

## 2016-04-09 DIAGNOSIS — M5416 Radiculopathy, lumbar region: Secondary | ICD-10-CM

## 2016-04-09 NOTE — Progress Notes (Signed)
Patient: TARL CEPHAS, Male    DOB: Jul 28, 1945, 71 y.o.   MRN: [id number] Visit Date: 04/09/2016  Today's Provider: Vernie Murders, PA   Chief Complaint  Patient presents with  . Medicare Wellness   Subjective:    Annual wellness visit MARIAH HARN is a 71 y.o. male who presents today for his Subsequent Annual Wellness Visit. He feels fairly well. He reports exercising none at this time. He reports he is sleeping fairly well.  -----------------------------------------------------------   Review of Systems  Constitutional: Negative.   HENT: Negative.   Eyes: Negative.   Respiratory: Positive for shortness of breath.   Cardiovascular: Negative.   Gastrointestinal: Negative.   Endocrine: Negative.   Genitourinary: Negative.   Musculoskeletal: Positive for gait problem.  Skin: Negative.   Allergic/Immunologic: Negative.   Hematological: Negative.   Psychiatric/Behavioral: Negative.     Social History   Social History  . Marital status: Married    Spouse name: N/A  . Number of children: N/A  . Years of education: N/A   Occupational History  . Not on file.   Social History Main Topics  . Smoking status: Former Smoker    Packs/day: 2.00    Years: 30.00    Types: Cigarettes  . Smokeless tobacco: Never Used  . Alcohol use 0.0 oz/week     Comment: Occasionally   . Drug use: No  . Sexual activity: Not on file   Other Topics Concern  . Not on file   Social History Narrative  . No narrative on file    Patient Active Problem List   Diagnosis Date Noted  . Anxiety 03/07/2015  . Clinical depression 03/07/2015  . Difficulty hearing 03/07/2015  . Hypercholesteremia 03/07/2015  . 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 Acid reflux 06/26/2009  . External hemorrhoids without complication 16/03/930  . 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 APPENDECTOMY  1994  . CARPAL  TUNNEL RELEASE  2012  . LUMBAR LAMINECTOMIES  1976 & 1978   Dr. [name]  . SIGMOIDOSCOPY  2010    His family history includes Arrhythmia in his sister; Dementia in his mother; GER disease in his mother; Heart murmur in his sister; Hypertension in his father.     Previous Medications   ASPIRIN 81 MG EC TABLET    Take by mouth.   BUSPIRONE (BUSPAR) 15 MG TABLET    Take 1 tablet (15 mg total) by mouth 2 (two) times daily.   DICLOFENAC (VOLTAREN) 75 MG EC TABLET    TAKE 1 TABLET TWICE DAILY   FLUOXETINE (PROZAC) 20 MG CAPSULE    TAKE 1 CAPSULE EVERY DAY   RANITIDINE (ZANTAC) 150 MG TABLET    TAKE 1 TABLET (150 MG TOTAL) TWICE DAILY   SIMVASTATIN (ZOCOR) 20 MG TABLET    Take 1 tablet (20 mg total) by mouth daily.    Patient Care Team: Jerrol Banana., MD as PCP - General (Family Medicine)      Objective:   Vitals: BP 130/78 (BP Location: Right Arm, Patient Position: Sitting, Cuff Size: Normal)   Pulse 78   Temp 98.7 F (37.1 C) (Oral)   Resp 16   Ht 5' 7.25" (1.708 m)   Wt 199 lb 3.2 oz (90.4 kg)   SpO2 91%   BMI 30.97 kg/m   Physical Exam  Constitutional: He is oriented to person, place, and time. He appears well-developed and  well-nourished.  HENT:  Head: Normocephalic.  Right Ear: External ear normal.  Left Ear: External ear normal.  Mouth/Throat: Oropharynx is clear and moist.  Eyes: Conjunctivae are normal.  Neck: Neck supple.  Cardiovascular: Normal rate and regular rhythm.   No murmur heard. Pulmonary/Chest: Effort normal and breath sounds normal.  Abdominal: Soft. Bowel sounds are normal.  Genitourinary: Rectum normal, prostate normal and penis normal. Rectal exam shows guaiac negative stool.  Lymphadenopathy:    He has no cervical adenopathy.  Neurological: He is alert and oriented to person, place, and time.  Skin: No rash noted.  Psychiatric: He has a normal mood and affect. His behavior is normal. Thought content normal.    Activities of Daily  Living In your present state of health, do you have any difficulty performing the following activities: 04/09/2016  Hearing? N  Vision? N  Difficulty concentrating or making decisions? N  Walking or climbing stairs? N  Dressing or bathing? N  Doing errands, shopping? N  Some recent data might be hidden    Fall Risk Assessment Fall Risk  04/09/2016 04/03/2015  Falls in the past year? Yes No  Number falls in past yr: 1 -  Injury with Fall? No -     Depression Screen PHQ 2/9 Scores 04/09/2016 04/03/2015  PHQ - 2 Score 0 0    Cognitive Testing - 6-CIT  Correct? Score   What year is it? yes 0 0 or 4  What month is it? yes 0 0 or 3  Memorize:    [name],  40,  [place],      What time is it? (within 1 hour) yes 0 0 or 3  Count backwards from 20 yes 0 0, 2, or 4  Name the months of the year yes 0 0, 2, or 4  Repeat name & address above yes 3 0, 2, 4, 6, 8, or 10       TOTAL SCORE  3/28   Interpretation:  Normal  Normal (0-7) Abnormal (8-28)     Audit-C Alcohol Use Screening  Question Answer Points  How often do you have alcoholic drink? never 0  On days you do drink alcohol, how many drinks do you typically consume? 0 0  How oftey will you drink 6 or more in a total? never 0  Total Score:  0   A score of 3 or more in women, and 4 or more in men indicates increased risk for alcohol abuse, EXCEPT if all of the points are from question 1.    Assessment & Plan:     Annual Wellness Visit  Reviewed patient's Family Medical History Reviewed and updated list of patient's medical providers Assessment of cognitive impairment was done Assessed patient's functional ability Established a written schedule for health screening [place] Completed and Reviewed  Exercise Activities and Dietary recommendations Goals    Recommend low fat diet and some weight loss. May exercise as back pain with radiculopathy allows.      Immunization History   Administered Date(s) Administered  . Influenza Split 01/08/2010  . Influenza, High Dose Seasonal PF 04/01/2014  . Influenza,inj,Quad PF,36+ Mos 03/20/2013  . Influenza-Unspecified 01/18/2015  . Pneumococcal Conjugate-13 04/01/2014  . Pneumococcal Polysaccharide-23 04/03/2015    Health Maintenance  Topic Date Due  . TETANUS/TDAP  10/30/2016  . COLONOSCOPY  12/07/2022  . INFLUENZA VACCINE  Completed  . ZOSTAVAX  Completed  . Hepatitis C Screening  Completed  .  PNA vac Low Risk Adult  Completed     Discussed health benefits of physical activity, and encouraged him to engage in regular exercise appropriate for his age and condition.    ------------------------------------------------------------------------------------------------------------ 1. Annual physical exam General health good. Declines flu shot and other immunizations up to date. Last colonoscopy done in 2014. Recommend annual eye exam (gives history of early cataracts).  2. Hypercholesteremia Tolerating Simvastatin 20 mg qd without muscle pains or GI upset. Continue low fat diet and get CMP, TSH and Lipid Panel follow up. - Comprehensive metabolic panel - Lipid panel - TSH  3. Anxiety Feels the Buspar and Fluoxetine has been controlling anxiety with depressive reactions. Not sure he still needs it and want to try to taper back off. Recommended 1/2 tablets for 2 weeks then every other day for 2 weeks before stopping completely. - CBC with Differential/Platelet  4. Gastroesophageal reflux disease, esophagitis presence not specified No hematemesis or melena. No abdominal pain. No return of dyspepsia as long as he uses the Ranitidine 150 mg once a day. Given reflux precautions and recheck labs. - CBC with Differential/Platelet - Comprehensive metabolic panel  5. Screening PSA (prostate specific antigen) No significant nocturia, dysuria or urinary frequency. No prostate nodule palpable today. Will recheck PSA and follow  pending reports. - PSA  6. Lumbar back pain with radiculopathy affecting right lower extremity Well healed scar in the lower lumbar region from laminectomies in [place] by Dr. [name] ([place]). Still gets pains to radiate down the posterior right leg with an area of numbness laterally above the lateral malleolus to mid calf region. Pain worsens when sitting to ride in his car and has had a "flap from the left foot "when walking. No muscle weakness in the left leg. Will complete form for a handicap placard.  7. Hx of decompressive lumbar laminectomy Still has some pains and can't walk with toes dorsiflexed or to try to tiptoe. Only uses Tylenol prn for pain.

## 2016-04-10 LAB — CBC WITH DIFFERENTIAL/PLATELET
BASOS ABS: 0.1 10*3/uL (ref 0.0–0.2)
Basos: 1 %
EOS (ABSOLUTE): 0.1 10*3/uL (ref 0.0–0.4)
Eos: 1 %
HEMOGLOBIN: 15.3 g/dL (ref 13.0–17.7)
Hematocrit: 45.3 % (ref 37.5–51.0)
Immature Grans (Abs): 0 10*3/uL (ref 0.0–0.1)
Immature Granulocytes: 0 %
LYMPHS ABS: 1.6 10*3/uL (ref 0.7–3.1)
Lymphs: 27 %
MCH: 28.5 pg (ref 26.6–33.0)
MCHC: 33.8 g/dL (ref 31.5–35.7)
MCV: 85 fL (ref 79–97)
Monocytes Absolute: 0.5 10*3/uL (ref 0.1–0.9)
Monocytes: 8 %
NEUTROS ABS: 3.7 10*3/uL (ref 1.4–7.0)
Neutrophils: 63 %
Platelets: 242 10*3/uL (ref 150–379)
RBC: 5.36 x10E6/uL (ref 4.14–5.80)
RDW: 13.7 % (ref 12.3–15.4)
WBC: 5.9 10*3/uL (ref 3.4–10.8)

## 2016-04-10 LAB — COMPREHENSIVE METABOLIC PANEL
ALBUMIN: 4.9 g/dL — AB (ref 3.5–4.8)
ALK PHOS: 96 IU/L (ref 39–117)
ALT: 31 IU/L (ref 0–44)
AST: 25 IU/L (ref 0–40)
Albumin/Globulin Ratio: 2 (ref 1.2–2.2)
BILIRUBIN TOTAL: 0.7 mg/dL (ref 0.0–1.2)
BUN / CREAT RATIO: 11 (ref 10–24)
BUN: 10 mg/dL (ref 8–27)
CHLORIDE: 99 mmol/L (ref 96–106)
CO2: 27 mmol/L (ref 18–29)
CREATININE: 0.9 mg/dL (ref 0.76–1.27)
Calcium: 10 mg/dL (ref 8.6–10.2)
GFR calc Af Amer: 98 mL/min/{1.73_m2} (ref >59)
GFR calc non Af Amer: 84 mL/min/{1.73_m2} (ref >59)
GLUCOSE: 97 mg/dL (ref 65–99)
Globulin, Total: 2.4 g/dL (ref 1.5–4.5)
Potassium: 4.5 mmol/L (ref 3.5–5.2)
Sodium: 141 mmol/L (ref 134–144)
Total Protein: 7.3 g/dL (ref 6.0–8.5)

## 2016-04-10 LAB — LIPID PANEL
CHOLESTEROL TOTAL: 148 mg/dL (ref 100–199)
Chol/HDL Ratio: 3.6 ratio units (ref 0.0–5.0)
HDL: 41 mg/dL (ref >39)
LDL Calculated: 76 mg/dL (ref 0–99)
TRIGLYCERIDES: 155 mg/dL — AB (ref 0–149)
VLDL CHOLESTEROL CAL: 31 mg/dL (ref 5–40)

## 2016-04-10 LAB — TSH: TSH: 1.9 u[IU]/mL (ref 0.450–4.500)

## 2016-04-10 LAB — PSA: Prostate Specific Ag, Serum: 0.6 ng/mL (ref 0.0–4.0)

## 2016-04-12 ENCOUNTER — Other Ambulatory Visit: Payer: Self-pay | Admitting: Family Medicine

## 2016-04-12 ENCOUNTER — Telehealth: Payer: Self-pay

## 2016-04-12 DIAGNOSIS — F419 Anxiety disorder, unspecified: Secondary | ICD-10-CM

## 2016-04-12 NOTE — Telephone Encounter (Signed)
[employer]

## 2016-04-12 NOTE — Telephone Encounter (Signed)
-----   Message from Margo Common, [place] sent at 04/12/2016  8:12 AM EST ----- All blood tests normal except triglycerides slightly up. Recommend watch low fat diet closer and continue Simvastatin 20 mg qd. Recheck progress in 6 months.

## 2016-04-13 NOTE — Telephone Encounter (Signed)
Advised patient of results.  

## 2016-04-16 ENCOUNTER — Ambulatory Visit (INDEPENDENT_AMBULATORY_CARE_PROVIDER_SITE_OTHER): Payer: Medicare HMO | Admitting: Physician Assistant

## 2016-04-16 ENCOUNTER — Encounter: Payer: Self-pay | Admitting: Physician Assistant

## 2016-04-16 VITALS — BP 128/64 | HR 72 | Temp 98.4°F | Resp 16 | Wt 199.0 lb

## 2016-04-16 DIAGNOSIS — B029 Zoster without complications: Secondary | ICD-10-CM | POA: Diagnosis not present

## 2016-04-16 MED ORDER — VALACYCLOVIR HCL 1 G PO TABS: 1000.0000 mg | ORAL_TABLET | Freq: Three times a day (TID) | ORAL | 0 refills | Status: AC

## 2016-04-16 MED ORDER — GABAPENTIN 300 MG PO CAPS: ORAL_CAPSULE | ORAL | 0 refills | Status: DC

## 2016-04-16 NOTE — Progress Notes (Signed)
Patient: Justin Warren Male    DOB: [birth year]   71 y.o.   MRN: [id number] Visit Date: 04/16/2016  Today's Provider: Trinna Post, PA-C   Chief Complaint  Patient presents with  . Rash    Possible Shingles; symptoms started Sunday.   Subjective:    Rash  This is a new problem. The current episode started in the past 7 days. The problem has been gradually worsening since onset. The affected locations include the torso (Right side.). The rash is characterized by pain and blistering. He was exposed to nothing.    Patient is 71 y/o man with history of Zostavax vaccine presenting today with painful right sided rash around right breast. Reports he felt burning and pain x 2 days in his right chest before emergence of rash. Describes rash currently as itchy and stabbing in pain. Denies new products, injuries. Has never had shingles before. No bleeding or pus drainage from lesions. Patient taking 624m Tylenol x 3 without much relief.    Allergies  Allergen Reactions  . Amoxicillin-Pot Clavulanate      Current Outpatient Prescriptions:  .  Aspirin 81 MG EC tablet, Take by mouth., Disp: , Rfl:  .  busPIRone (BUSPAR) 15 MG tablet, TAKE 1 TABLET TWICE DAILY, Disp: 180 tablet, Rfl: 3 .  FLUoxetine (PROZAC) 20 MG capsule, TAKE 1 CAPSULE EVERY DAY, Disp: 90 capsule, Rfl: 0 .  ranitidine (ZANTAC) 150 MG tablet, TAKE 1 TABLET (150 MG TOTAL) TWICE DAILY, Disp: 180 tablet, Rfl: 3 .  simvastatin (ZOCOR) 20 MG tablet, TAKE 1 TABLET EVERY DAY, Disp: 90 tablet, Rfl: 3 .  gabapentin (NEURONTIN) 300 MG capsule, 3091monce day 1, 300 mg twice day 2, 300 mg three times day 3 and on, Disp: 90 capsule, Rfl: 0 .  valACYclovir (VALTREX) 1000 MG tablet, Take 1 tablet (1,000 mg total) by mouth 3 (three) times daily., Disp: 21 tablet, Rfl: 0  Review of Systems  Constitutional: Negative.   Skin: Positive for rash. Negative for color change, pallor and wound.  Neurological: Positive for headaches  (Mild headache).    Social History  Substance Use Topics  . Smoking status: Former Smoker    Packs/day: 2.00    Years: 30.00    Types: Cigarettes  . Smokeless tobacco: Never Used  . Alcohol use 0.0 oz/week     Comment: Occasionally    Objective:   BP 128/64 (BP Location: Left Arm, Patient Position: Sitting, Cuff Size: Normal)   Pulse 72   Temp 98.4 F (36.9 C) (Oral)   Resp 16   Wt 199 lb (90.3 kg)   BMI 30.94 kg/m   Physical Exam  Constitutional: He appears well-developed and well-nourished. He does not appear ill. No distress.  Cardiovascular: Normal rate.   Pulmonary/Chest:    Maculopapular erythematous lesions in dermatomal pattern on right side of chest.  Skin: Skin is warm and dry. Rash noted. There is erythema.  Psychiatric: He has a normal mood and affect. His behavior is normal.  Vitals reviewed.       Assessment & Plan:     1. Herpes zoster without complication  Patient presenting with shingles in approximately T7 dermatome. Will treat as below. Counseled patient on postherpetic neuralgia as complication. Will start on gabapentin titrating as below. Counseled patient on sedation risks and advised him to call back if this becomes problematic. Will see him back in one week.  - valACYclovir (VALTREX) 1000 MG tablet; Take 1  tablet (1,000 mg total) by mouth 3 (three) times daily.  Dispense: 21 tablet; Refill: 0 - gabapentin (NEURONTIN) 300 MG capsule; 327m once day 1, 300 mg twice day 2, 300 mg three times day 3 and on  Dispense: 90 capsule; Refill: 0  Return in about 1 week (around 04/23/2016).  The entirety of the information documented in the History of Present Illness, Review of Systems and Physical Exam were personally obtained by me. Portions of this information were initially documented by LAshley Royalty CMA and reviewed by me for thoroughness and accuracy.    Patient Instructions  Shingles Shingles is an infection that causes a painful skin rash and  fluid-filled blisters. Shingles is caused by the same virus that causes chickenpox. Shingles only develops in people who:  Have had chickenpox.  Have gotten the chickenpox vaccine. (This is rare.) The first symptoms of shingles may be itching, tingling, or pain in an area on your skin. A rash will follow in a few days or weeks. The rash is usually on one side of the body in a bandlike or beltlike pattern. Over time, the rash turns into fluid-filled blisters that break open, scab over, and dry up. Medicines may:  Help you manage pain.  Help you recover more quickly.  Help to prevent long-term problems. Follow these instructions at home: Medicines  Take medicines only as told by your doctor.  Apply an anti-itch or numbing cream to the affected area as told by your doctor. Blister and Rash Care  Take a cool bath or put cool compresses on the area of the rash or blisters as told by your doctor. This may help with pain and itching.  Keep your rash covered with a loose bandage (dressing). Wear loose-fitting clothing.  Keep your rash and blisters clean with mild soap and cool water or as told by your doctor.  Check your rash every day for signs of infection. These include redness, swelling, and pain that lasts or gets worse.  Do not pick your blisters.  Do not scratch your rash. General instructions  Rest as told by your doctor.  Keep all follow-up visits as told by your doctor. This is important.  Until your blisters scab over, your infection can cause chickenpox in people who have never had it or been vaccinated against it. To prevent this from happening, avoid touching other people or being around other people, especially:  Babies.  Pregnant women.  Children who have eczema.  Elderly people who have transplants.  People who have chronic illnesses, such as leukemia or AIDS. Contact a doctor if:  Your pain does not get better with medicine.  Your pain does not get  better after the rash heals.  Your rash looks infected. Signs of infection include:  Redness.  Swelling.  Pain that lasts or gets worse. Get help right away if:  The rash is on your face or nose.  You have pain in your face, pain around your eye area, or loss of feeling on one side of your face.  You have ear pain or you have ringing in your ear.  You have loss of taste.  Your condition gets worse. This information is not intended to replace advice given to you by your health care provider. Make sure you discuss any questions you have with your health care provider. Document Released: 09/01/2007 Document Revised: 11/09/2015 Document Reviewed: 12/25/2013 Elsevier Interactive Patient Education  2017 [employer]  Trinna Post, PA-C  [facility]

## 2016-04-16 NOTE — Patient Instructions (Signed)
Shingles Shingles is an infection that causes a painful skin rash and fluid-filled blisters. Shingles is caused by the same virus that causes chickenpox. Shingles only develops in people who:  Have had chickenpox.  Have gotten the chickenpox vaccine. (This is rare.) The first symptoms of shingles may be itching, tingling, or pain in an area on your skin. A rash will follow in a few days or weeks. The rash is usually on one side of the body in a bandlike or beltlike pattern. Over time, the rash turns into fluid-filled blisters that break open, scab over, and dry up. Medicines may:  Help you manage pain.  Help you recover more quickly.  Help to prevent long-term problems. Follow these instructions at home: Medicines  Take medicines only as told by your doctor.  Apply an anti-itch or numbing cream to the affected area as told by your doctor. Blister and Rash Care  Take a cool bath or put cool compresses on the area of the rash or blisters as told by your doctor. This may help with pain and itching.  Keep your rash covered with a loose bandage (dressing). Wear loose-fitting clothing.  Keep your rash and blisters clean with mild soap and cool water or as told by your doctor.  Check your rash every day for signs of infection. These include redness, swelling, and pain that lasts or gets worse.  Do not pick your blisters.  Do not scratch your rash. General instructions  Rest as told by your doctor.  Keep all follow-up visits as told by your doctor. This is important.  Until your blisters scab over, your infection can cause chickenpox in people who have never had it or been vaccinated against it. To prevent this from happening, avoid touching other people or being around other people, especially:  Babies.  Pregnant women.  Children who have eczema.  Elderly people who have transplants.  People who have chronic illnesses, such as leukemia or AIDS. Contact a doctor if:  Your  pain does not get better with medicine.  Your pain does not get better after the rash heals.  Your rash looks infected. Signs of infection include:  Redness.  Swelling.  Pain that lasts or gets worse. Get help right away if:  The rash is on your face or nose.  You have pain in your face, pain around your eye area, or loss of feeling on one side of your face.  You have ear pain or you have ringing in your ear.  You have loss of taste.  Your condition gets worse. This information is not intended to replace advice given to you by your health care provider. Make sure you discuss any questions you have with your health care provider. Document Released: 09/01/2007 Document Revised: 11/09/2015 Document Reviewed: 12/25/2013 Elsevier Interactive Patient Education  2017 [employer].

## 2016-04-23 ENCOUNTER — Encounter: Payer: Self-pay | Admitting: Physician Assistant

## 2016-04-23 ENCOUNTER — Ambulatory Visit (INDEPENDENT_AMBULATORY_CARE_PROVIDER_SITE_OTHER): Payer: Medicare HMO | Admitting: Physician Assistant

## 2016-04-23 VITALS — BP 128/82 | HR 72 | Temp 98.0°F | Resp 16 | Wt 200.0 lb

## 2016-04-23 DIAGNOSIS — B029 Zoster without complications: Secondary | ICD-10-CM | POA: Diagnosis not present

## 2016-04-23 DIAGNOSIS — B0229 Other postherpetic nervous system involvement: Secondary | ICD-10-CM | POA: Diagnosis not present

## 2016-04-23 MED ORDER — GABAPENTIN 300 MG PO CAPS: 300.0000 mg | ORAL_CAPSULE | Freq: Four times a day (QID) | ORAL | 0 refills | Status: DC

## 2016-04-23 NOTE — Progress Notes (Signed)
Patient: Justin Warren Male    DOB: 10/07/1945   71 y.o.   MRN: [id number] Visit Date: 04/23/2016  Today's Provider: Trinna Post, PA-C   Chief Complaint  Patient presents with  . Herpes Zoster   Subjective:    Rash  This is a new problem. The current episode started 1 to 4 weeks ago. The problem has been gradually improving since onset. The affected locations include the abdomen. The rash is characterized by blistering and pain. He was exposed to nothing. Associated symptoms include fatigue. Pertinent negatives include no fever.    Pt is coming in today for a one week follow on shingles.  He reports the rash is improving but the pain is still about the same.  He is able to keep the pain under control with Gabapentin 900 mg in three divided doses and Tylenol.        Allergies  Allergen Reactions  . Amoxicillin-Pot Clavulanate      Current Outpatient Prescriptions:  .  Aspirin 81 MG EC tablet, Take by mouth., Disp: , Rfl:  .  busPIRone (BUSPAR) 15 MG tablet, TAKE 1 TABLET TWICE DAILY, Disp: 180 tablet, Rfl: 3 .  FLUoxetine (PROZAC) 20 MG capsule, TAKE 1 CAPSULE EVERY DAY, Disp: 90 capsule, Rfl: 0 .  gabapentin (NEURONTIN) 300 MG capsule, 359m once day 1, 300 mg twice day 2, 300 mg three times day 3 and on, Disp: 90 capsule, Rfl: 0 .  ranitidine (ZANTAC) 150 MG tablet, TAKE 1 TABLET (150 MG TOTAL) TWICE DAILY, Disp: 180 tablet, Rfl: 3 .  simvastatin (ZOCOR) 20 MG tablet, TAKE 1 TABLET EVERY DAY, Disp: 90 tablet, Rfl: 3 .  valACYclovir (VALTREX) 1000 MG tablet, Take 1 tablet (1,000 mg total) by mouth 3 (three) times daily., Disp: 21 tablet, Rfl: 0  Review of Systems  Constitutional: Positive for fatigue. Negative for activity change, appetite change, chills, diaphoresis, fever and unexpected weight change.  Skin: Positive for rash.  Neurological: Negative for dizziness, light-headedness and headaches.    Social History  Substance Use Topics  . Smoking status:  Former Smoker    Packs/day: 2.00    Years: 30.00    Types: Cigarettes  . Smokeless tobacco: Never Used  . Alcohol use 0.0 oz/week     Comment: Occasionally    Objective:   BP 128/82 (BP Location: Left Arm, Patient Position: Sitting, Cuff Size: Normal)   Pulse 72   Temp 98 F (36.7 C) (Oral)   Resp 16   Wt 200 lb (90.7 kg)   BMI 31.09 kg/m   Physical Exam  Constitutional: He appears well-developed and well-nourished. He does not appear ill. No distress.  Cardiovascular: Normal rate.   Pulmonary/Chest: Effort normal.  Skin: Skin is warm and dry. Rash noted. There is erythema.  Healing erythematous lesions that appear improved from last visit on right thoracic area         Assessment & Plan:     1. Herpes zoster without complication  Improving. Advised patient to keep covered with clothing as long as there are visible lesions. Pain may continue after lesions have disappeared.   2. Postherpetic neuralgia  Increased gabapentin to help with PHN. Patient is willing to take one capsule 4 times a day. Will see if this lessons pain. Patient says pain is tolerable.  - gabapentin (NEURONTIN) 300 MG capsule; Take 1 capsule (300 mg total) by mouth 4 (four) times daily.  Dispense: 90 capsule; Refill: 0  Return if symptoms worsen or fail to improve.  Patient Instructions  Postherpetic Neuralgia Postherpetic neuralgia (PHN) is nerve pain that occurs after a shingles infection. Shingles is a painful rash that appears on one side of the body, usually on your trunk or face. Shingles is caused by the varicella-zoster virus. This is the same virus that causes chickenpox. In people who have had chickenpox, the virus can resurface years later and cause shingles. You may have PHN if you continue to have pain for 3 months after your shingles rash has gone away. PHN appears in the same area where you had the shingles rash. For most people, PHN goes away within 1 year.  Getting a vaccination for  shingles can prevent PHN. This vaccine is recommended for people older than 50. It may prevent shingles and may also lower your risk of PHN if you do get shingles. CAUSES PHN is caused by damage to your nerves from the varicella-zoster virus. This damage makes your nerves overly sensitive.  RISK FACTORS Aging is the biggest risk factor for developing PHN. Most people who get PHN are older than 67. Other risk factors include:  Having very bad pain before your shingles rash starts.  Having a very bad rash.  Having shingles in the nerve that supplies your face and eye (trigeminal nerve). SIGNS AND SYMPTOMS Pain is the main symptom of PHN. The pain is often very bad and may be described as stabbing, burning, or feeling like an electric shock. The pain may come and go or may be there all the time. Pain may be triggered by light touches on the skin or changes in temperature. You may have itching along with the pain. DIAGNOSIS  Your health care provider may diagnose PHN based on your symptoms and your history of shingles. Lab studies and other diagnostic tests are usually not needed. TREATMENT  There is no cure for PHN. Treatment for PHN will focus on pain relief. Over-the-counter pain relievers do not usually relieve PHN pain. You may need to work with a pain specialist. Treatment may include:  Antidepressant medicines to help with pain and improve sleep.  Antiseizure medicines to relieve nerve pain.  Strong pain relievers (opioids).  A numbing patch worn on the skin (lidocaine patch). HOME CARE INSTRUCTIONS It may take a long time to recover from PHN. Work closely with your health care provider, and have a good support system at home.   Take all medicines as directed by your health care provider.  Wear loose, comfortable clothing.  Cover sensitive areas with a dressing to reduce friction from clothing rubbing on the area.  If cold does not make your pain worse, try applying a cool  compress or cooling gel pack to the area.  Talk to your health care provider if you feel depressed or desperate. Living with long-term pain can be depressing. SEEK MEDICAL CARE IF:  Your medicine is not helping.  You are struggling to manage your pain at home. This information is not intended to replace advice given to you by your health care provider. Make sure you discuss any questions you have with your health care provider. Document Released: 06/05/2002 Document Revised: 04/05/2014 Document Reviewed: 03/06/2013 Elsevier Interactive Patient Education  2017 [employer].   The entirety of the information documented in the History of Present Illness, Review of Systems and Physical Exam were personally obtained by me. Portions of this information were initially documented by Ashley Royalty, CMA and reviewed by me for thoroughness and  accuracy.          Trinna Post, PA-C  [facility]

## 2016-04-23 NOTE — Patient Instructions (Signed)
Postherpetic Neuralgia Postherpetic neuralgia (PHN) is nerve pain that occurs after a shingles infection. Shingles is a painful rash that appears on one side of the body, usually on your trunk or face. Shingles is caused by the varicella-zoster virus. This is the same virus that causes chickenpox. In people who have had chickenpox, the virus can resurface years later and cause shingles. You may have PHN if you continue to have pain for 3 months after your shingles rash has gone away. PHN appears in the same area where you had the shingles rash. For most people, PHN goes away within 1 year.  Getting a vaccination for shingles can prevent PHN. This vaccine is recommended for people older than 50. It may prevent shingles and may also lower your risk of PHN if you do get shingles. CAUSES PHN is caused by damage to your nerves from the varicella-zoster virus. This damage makes your nerves overly sensitive.  RISK FACTORS Aging is the biggest risk factor for developing PHN. Most people who get PHN are older than 71. Other risk factors include:  Having very bad pain before your shingles rash starts.  Having a very bad rash.  Having shingles in the nerve that supplies your face and eye (trigeminal nerve). SIGNS AND SYMPTOMS Pain is the main symptom of PHN. The pain is often very bad and may be described as stabbing, burning, or feeling like an electric shock. The pain may come and go or may be there all the time. Pain may be triggered by light touches on the skin or changes in temperature. You may have itching along with the pain. DIAGNOSIS  Your health care provider may diagnose PHN based on your symptoms and your history of shingles. Lab studies and other diagnostic tests are usually not needed. TREATMENT  There is no cure for PHN. Treatment for PHN will focus on pain relief. Over-the-counter pain relievers do not usually relieve PHN pain. You may need to work with a pain specialist. Treatment may  include:  Antidepressant medicines to help with pain and improve sleep.  Antiseizure medicines to relieve nerve pain.  Strong pain relievers (opioids).  A numbing patch worn on the skin (lidocaine patch). HOME CARE INSTRUCTIONS It may take a long time to recover from PHN. Work closely with your health care provider, and have a good support system at home.   Take all medicines as directed by your health care provider.  Wear loose, comfortable clothing.  Cover sensitive areas with a dressing to reduce friction from clothing rubbing on the area.  If cold does not make your pain worse, try applying a cool compress or cooling gel pack to the area.  Talk to your health care provider if you feel depressed or desperate. Living with long-term pain can be depressing. SEEK MEDICAL CARE IF:  Your medicine is not helping.  You are struggling to manage your pain at home. This information is not intended to replace advice given to you by your health care provider. Make sure you discuss any questions you have with your health care provider. Document Released: 06/05/2002 Document Revised: 04/05/2014 Document Reviewed: 03/06/2013 Elsevier Interactive Patient Education  2017 [employer].

## 2016-04-28 DIAGNOSIS — X32XXXA Exposure to sunlight, initial encounter: Secondary | ICD-10-CM | POA: Diagnosis not present

## 2016-04-28 DIAGNOSIS — L570 Actinic keratosis: Secondary | ICD-10-CM | POA: Diagnosis not present

## 2016-04-28 DIAGNOSIS — Z08 Encounter for follow-up examination after completed treatment for malignant neoplasm: Secondary | ICD-10-CM | POA: Diagnosis not present

## 2016-04-28 DIAGNOSIS — Z85828 Personal history of other malignant neoplasm of skin: Secondary | ICD-10-CM | POA: Diagnosis not present

## 2016-04-28 DIAGNOSIS — L821 Other seborrheic keratosis: Secondary | ICD-10-CM | POA: Diagnosis not present

## 2016-04-28 DIAGNOSIS — D045 Carcinoma in situ of skin of trunk: Secondary | ICD-10-CM | POA: Diagnosis not present

## 2016-04-28 DIAGNOSIS — D485 Neoplasm of uncertain behavior of skin: Secondary | ICD-10-CM | POA: Diagnosis not present

## 2016-05-13 ENCOUNTER — Other Ambulatory Visit: Payer: Self-pay | Admitting: Physician Assistant

## 2016-05-13 DIAGNOSIS — B029 Zoster without complications: Secondary | ICD-10-CM

## 2016-05-18 NOTE — Telephone Encounter (Signed)
Pharmacy requesting refills. Thanks!  

## 2016-05-19 NOTE — Telephone Encounter (Signed)
This gabapentin refill was for post herpetic neuralgia. Is patient still experiencing symptoms?

## 2016-05-20 NOTE — Telephone Encounter (Signed)
Pt does not need a refill.   Thanks,   -Mickel Baas

## 2016-05-31 DIAGNOSIS — H524 Presbyopia: Secondary | ICD-10-CM | POA: Diagnosis not present

## 2016-06-09 DIAGNOSIS — H25811 Combined forms of age-related cataract, right eye: Secondary | ICD-10-CM | POA: Diagnosis not present

## 2016-06-09 DIAGNOSIS — H43812 Vitreous degeneration, left eye: Secondary | ICD-10-CM | POA: Diagnosis not present

## 2016-06-09 DIAGNOSIS — H25812 Combined forms of age-related cataract, left eye: Secondary | ICD-10-CM | POA: Diagnosis not present

## 2016-06-09 DIAGNOSIS — H25813 Combined forms of age-related cataract, bilateral: Secondary | ICD-10-CM | POA: Diagnosis not present

## 2016-06-09 DIAGNOSIS — H40003 Preglaucoma, unspecified, bilateral: Secondary | ICD-10-CM | POA: Diagnosis not present

## 2016-06-21 DIAGNOSIS — H25813 Combined forms of age-related cataract, bilateral: Secondary | ICD-10-CM | POA: Diagnosis not present

## 2016-06-21 DIAGNOSIS — H40003 Preglaucoma, unspecified, bilateral: Secondary | ICD-10-CM | POA: Diagnosis not present

## 2016-07-05 DIAGNOSIS — D045 Carcinoma in situ of skin of trunk: Secondary | ICD-10-CM | POA: Diagnosis not present

## 2016-07-26 DIAGNOSIS — H25812 Combined forms of age-related cataract, left eye: Secondary | ICD-10-CM | POA: Diagnosis not present

## 2016-08-18 ENCOUNTER — Encounter: Payer: Self-pay | Admitting: Family Medicine

## 2016-08-18 ENCOUNTER — Ambulatory Visit (INDEPENDENT_AMBULATORY_CARE_PROVIDER_SITE_OTHER): Payer: Medicare HMO | Admitting: Family Medicine

## 2016-08-18 VITALS — BP 132/80 | HR 79 | Temp 99.4°F | Resp 16 | Wt 196.4 lb

## 2016-08-18 DIAGNOSIS — J069 Acute upper respiratory infection, unspecified: Secondary | ICD-10-CM

## 2016-08-18 MED ORDER — HYDROCODONE-HOMATROPINE 5-1.5 MG/5ML PO SYRP: ORAL_SOLUTION | ORAL | 0 refills | Status: DC

## 2016-08-18 NOTE — Patient Instructions (Signed)
Continue Mucinex. May add Sudafed for congestion. Let us know if not improved over the next weekend.

## 2016-08-18 NOTE — Progress Notes (Signed)
Subjective:     Patient ID: Justin Warren, male   DOB: 1945-11-29, 71 y.o.   MRN: [id number]  HPI  Chief Complaint  Patient presents with  . Cough    Patient comes in office today with complaints of cough and congestion for the past 3-4 days. Patient describes cough as painful and says his upper torso is sore. Patient complains cough is now productive of lime green like mucous, associated with cough patient reports headache and shortness of breath. Patient has been taking otc Mucinex 12hr relief.   Reports mild sinus congestion but no fever reported.   Review of Systems     Objective:   Physical Exam  Constitutional: He appears well-developed and well-nourished. No distress.  Ears: T.M's intact without inflammation Throat: no tonsillar enlargement or exudate Neck: no cervical adenopathy Lungs: clear     Assessment:    1. Viral upper respiratory tract infection - HYDROcodone-homatropine (HYCODAN) 5-1.5 MG/5ML syrup; 5 ml 4-6 hours as needed for cough  Dispense: 240 mL; Refill: 0    Plan:    Continue Mucinex, add decongestants as needed. Discussed natural history of illness.

## 2016-10-20 ENCOUNTER — Ambulatory Visit (INDEPENDENT_AMBULATORY_CARE_PROVIDER_SITE_OTHER): Payer: Medicare HMO | Admitting: Physician Assistant

## 2016-10-20 ENCOUNTER — Encounter: Payer: Self-pay | Admitting: Physician Assistant

## 2016-10-20 VITALS — BP 142/88 | HR 68 | Temp 98.2°F | Resp 16 | Wt 195.0 lb

## 2016-10-20 DIAGNOSIS — L299 Pruritus, unspecified: Secondary | ICD-10-CM

## 2016-10-20 DIAGNOSIS — T63441A Toxic effect of venom of bees, accidental (unintentional), initial encounter: Secondary | ICD-10-CM

## 2016-10-20 MED ORDER — PREDNISONE 10 MG (21) PO TBPK: ORAL_TABLET | ORAL | 0 refills | Status: DC

## 2016-10-20 NOTE — Progress Notes (Signed)
Patient: Justin Warren Male    DOB: 1945/06/22   71 y.o.   MRN: [id number] Visit Date: 10/20/2016  Today's Provider: Trinna Post, PA-C   Chief Complaint  Patient presents with  . Insect Bite   Subjective:    HPI   Justin Warren is a 71 y/o man who comes in today complaining of insect bites.  He states he was cleaning out his carport and hit a yellow jacket nest.  He was stung about four times on his arms and legs. This has causes red, raised itchy spots that he can't stop scratching. No trouble breathing or swallowing.     Allergies  Allergen Reactions  . Amoxicillin-Pot Clavulanate      Current Outpatient Prescriptions:  .  Aspirin 81 MG EC tablet, Take by mouth., Disp: , Rfl:  .  busPIRone (BUSPAR) 15 MG tablet, TAKE 1 TABLET TWICE DAILY, Disp: 180 tablet, Rfl: 3 .  FLUoxetine (PROZAC) 20 MG capsule, TAKE 1 CAPSULE EVERY DAY, Disp: 90 capsule, Rfl: 0 .  gabapentin (NEURONTIN) 300 MG capsule, Take 1 capsule (300 mg total) by mouth 4 (four) times daily., Disp: 90 capsule, Rfl: 0 .  ranitidine (ZANTAC) 150 MG tablet, TAKE 1 TABLET (150 MG TOTAL) TWICE DAILY, Disp: 180 tablet, Rfl: 3 .  simvastatin (ZOCOR) 20 MG tablet, TAKE 1 TABLET EVERY DAY, Disp: 90 tablet, Rfl: 3  Review of Systems  Constitutional: Negative.   HENT: Negative for sore throat, trouble swallowing and voice change.   Respiratory: Negative.   Skin: Positive for rash. Negative for color change, pallor and wound.  Neurological: Negative for dizziness, light-headedness and headaches.    Social History  Substance Use Topics  . Smoking status: Former Smoker    Packs/day: 2.00    Years: 30.00    Types: Cigarettes  . Smokeless tobacco: Never Used  . Alcohol use 0.0 oz/week     Comment: Occasionally    Objective:   BP (!) 142/88 (BP Location: Left Arm, Patient Position: Sitting, Cuff Size: Normal)   Pulse 68   Temp 98.2 F (36.8 C) (Oral)   Resp 16   Wt 195 lb (88.5 kg)   BMI 30.31 kg/m   Vitals:   10/20/16 0835  BP: (!) 142/88  Pulse: 68  Resp: 16  Temp: 98.2 F (36.8 C)  TempSrc: Oral  Weight: 195 lb (88.5 kg)     Physical Exam  Constitutional: He is oriented to person, place, and time. He appears well-developed and well-nourished.  Pulmonary/Chest: Effort normal.  Neurological: He is alert and oriented to person, place, and time.  Skin: Skin is warm and dry. There is erythema.  Multiple areas of erythematous induration with evidence of excoriation on upper extremities.  Psychiatric: He has a normal mood and affect. His behavior is normal.        Assessment & Plan:     1. Bee sting, accidental or unintentional, initial encounter  - predniSONE (STERAPRED UNI-PAK 21 TAB) 10 MG (21) TBPK tablet; Take 6 pills on day one, then 5 on day two, and so on.  Dispense: 21 tablet; Refill: 0  2. Itching  - predniSONE (STERAPRED UNI-PAK 21 TAB) 10 MG (21) TBPK tablet; Take 6 pills on day one, then 5 on day two, and so on.  Dispense: 21 tablet; Refill: 0  Return if symptoms worsen or fail to improve.  The entirety of the information documented in the History of Present Illness, Review  of Systems and Physical Exam were personally obtained by me. Portions of this information were initially documented by Ashley Royalty, CMA and reviewed by me for thoroughness and accuracy.         Trinna Post, PA-C  [facility]

## 2016-10-20 NOTE — Patient Instructions (Signed)
Bee, Wasp, or [employer], Adult Bees, wasps, and hornets are part of a family of insects that can sting people. These stings can cause pain and inflammation, but they are usually not serious. However, some people may have an allergic reaction to a sting. This can cause the symptoms to be more severe. What increases the risk? You may be at a greater risk of getting stung if you:  Provoke a stinging insect by swatting or disturbing it.  Wear strong-smelling soaps, deodorants, or body sprays.  Spend time outdoors near gardens with flowers or fruit trees or in clothes that expose skin.  Eat or drink outside.  What are the signs or symptoms? Common symptoms of this condition include:  A red lump in the skin that sometimes has a tiny hole in the center. In some cases, a stinger may be in the center of the wound.  Pain and itching at the sting site.  Redness and swelling around the sting site. If you have an allergic reaction (localized allergic reaction), the swelling and redness may spread out from the sting site. In some cases, this reaction can continue to develop over the next 24-48 hours.  In rare cases, a person may have a severe allergic reaction (anaphylactic reaction) to a sting. Symptoms of an anaphylactic reaction may include:  Wheezing or difficulty breathing.  Raised, itchy, red patches on the skin (hives).  Nausea or vomiting.  Abdominal cramping.  Diarrhea.  Tightness in the chest or chest pain.  Dizziness or fainting.  Redness of the face (flushing).  Hoarse voice.  Swollen tongue, lips, or face.  How is this diagnosed? This condition is usually diagnosed based on your symptoms and medical history as well as a physical exam. You may have an allergy test to determine if you are allergic to the substance that the insect injected during the sting (venom). How is this treated? If you were stung by a bee, the stinger and a small sac of venom may be in the wound.  It is important to remove the stinger as soon as possible. You can do this by brushing across the wound with gauze, a fingernail, or a flat card such as a credit card. Removing the stinger can help reduce the severity of your body's reaction to the sting. Most stings can be treated with:  Icing to reduce swelling in the area.  Medicines (antihistamines) to treat itching or an allergic reaction.  Medicines to help reduce pain. These may be medicines that you take by mouth, or medicated creams or lotions that you apply to your skin.  Pay close attention to your symptoms after you have been stung. If possible, have someone stay with you to make sure you do not have an allergic reaction. If you have any signs of an allergic reaction, call your health care provider. If you have ever had a severe allergic reaction, your health care provider may give you an inhaler or injectable medicine (epinephrine auto-injector) to use if necessary. Follow these instructions at home:  Wash the sting site 2-3 times each day with soap and water as told by your health care provider.  Apply or take over-the-counter and prescription medicines only as told by your health care provider.  If directed, apply ice to the sting area. ? Put ice in a plastic bag. ? Place a towel between your skin and the bag. ? Leave the ice on for 20 minutes, 2-3 times a day.  Do not scratch the sting  area.  If you had a severe allergic reaction to a sting, you may need: ? To wear a medical bracelet or necklace that lists the allergy. ? To learn when and how to use an anaphylaxis kit or epinephrine injection. Your family members and coworkers may also need to learn this. ? To carry an anaphylaxis kit or epinephrine injection with you at all times. How is this prevented?  Avoid swatting at stinging insects and disturbing insect nests.  Do not use fragrant soaps or lotions.  Wear shoes, pants, and long sleeves when spending time  outdoors, especially in grassy areas where stinging insects are common.  Keep outdoor areas free from nests or hives.  Keep food and drink containers covered when eating outdoors.  Avoid working or sitting near flowering plants, if possible.  Wear gloves if you are gardening or working outdoors.  If an attack by a stinging insect or a swarm seems likely in the moment, move away from the area or find a barrier between you and the insect(s), such as a door. Contact a health care provider if:  Your symptoms do not get better in 2-3 days.  You have redness, swelling, or pain that spreads beyond the area of the sting.  You have a fever. Get help right away if: You have symptoms of a severe allergic reaction. These include:  Wheezing or difficulty breathing.  Tightness in the chest or chest pain.  Light-headedness or fainting.  Itchy, raised, red patches on the skin.  Nausea or vomiting.  Abdominal cramping.  Diarrhea.  A swollen tongue or lips, or trouble swallowing.  Dizziness or fainting.  Summary  Stings from bees, wasps, and hornets can cause pain and inflammation, but they are usually not serious. However, some people may have an allergic reaction to a sting. This can cause the symptoms to be more severe.  Pay close attention to your symptoms after you have been stung. If possible, have someone stay with you to make sure you do not have an allergic reaction.  Call your health care provider if you have any signs of an allergic reaction. This information is not intended to replace advice given to you by your health care provider. Make sure you discuss any questions you have with your health care provider. Document Released: 03/15/2005 Document Revised: 05/20/2016 Document Reviewed: 05/20/2016 Elsevier Interactive Patient Education  2018 [employer]  

## 2016-10-27 DIAGNOSIS — H40003 Preglaucoma, unspecified, bilateral: Secondary | ICD-10-CM | POA: Diagnosis not present

## 2016-10-27 DIAGNOSIS — H43812 Vitreous degeneration, left eye: Secondary | ICD-10-CM | POA: Diagnosis not present

## 2016-10-27 DIAGNOSIS — H25811 Combined forms of age-related cataract, right eye: Secondary | ICD-10-CM | POA: Diagnosis not present

## 2016-10-27 DIAGNOSIS — Z961 Presence of intraocular lens: Secondary | ICD-10-CM | POA: Diagnosis not present

## 2016-11-08 ENCOUNTER — Other Ambulatory Visit: Payer: Self-pay | Admitting: Family Medicine

## 2016-11-08 DIAGNOSIS — F419 Anxiety disorder, unspecified: Secondary | ICD-10-CM

## 2016-11-08 NOTE — Telephone Encounter (Signed)
Patient sees Vernie Murders, PA_aa

## 2016-11-10 DIAGNOSIS — H25811 Combined forms of age-related cataract, right eye: Secondary | ICD-10-CM | POA: Diagnosis not present

## 2016-11-10 DIAGNOSIS — H2511 Age-related nuclear cataract, right eye: Secondary | ICD-10-CM | POA: Diagnosis not present

## 2016-11-15 DIAGNOSIS — X32XXXA Exposure to sunlight, initial encounter: Secondary | ICD-10-CM | POA: Diagnosis not present

## 2016-11-15 DIAGNOSIS — Z85828 Personal history of other malignant neoplasm of skin: Secondary | ICD-10-CM | POA: Diagnosis not present

## 2016-11-15 DIAGNOSIS — Z08 Encounter for follow-up examination after completed treatment for malignant neoplasm: Secondary | ICD-10-CM | POA: Diagnosis not present

## 2016-11-15 DIAGNOSIS — L821 Other seborrheic keratosis: Secondary | ICD-10-CM | POA: Diagnosis not present

## 2016-11-15 DIAGNOSIS — L570 Actinic keratosis: Secondary | ICD-10-CM | POA: Diagnosis not present

## 2016-12-13 ENCOUNTER — Encounter: Payer: Self-pay | Admitting: Family Medicine

## 2016-12-13 ENCOUNTER — Ambulatory Visit
Admission: RE | Admit: 2016-12-13 | Discharge: 2016-12-13 | Disposition: A | Discharge status: Home / Self Care | Payer: Medicare HMO | Source: Ambulatory Visit | Attending: Family Medicine | Admitting: Family Medicine

## 2016-12-13 ENCOUNTER — Ambulatory Visit (INDEPENDENT_AMBULATORY_CARE_PROVIDER_SITE_OTHER): Payer: Medicare HMO | Admitting: Family Medicine

## 2016-12-13 VITALS — BP 120/80 | HR 68 | Temp 97.8°F | Resp 16 | Ht 67.0 in | Wt 199.0 lb

## 2016-12-13 DIAGNOSIS — R0609 Other forms of dyspnea: Secondary | ICD-10-CM | POA: Insufficient documentation

## 2016-12-13 DIAGNOSIS — R0602 Shortness of breath: Secondary | ICD-10-CM | POA: Diagnosis not present

## 2016-12-13 DIAGNOSIS — T733XXA Exhaustion due to excessive exertion, initial encounter: Secondary | ICD-10-CM | POA: Diagnosis not present

## 2016-12-13 DIAGNOSIS — I700 Atherosclerosis of aorta: Secondary | ICD-10-CM | POA: Diagnosis not present

## 2016-12-13 DIAGNOSIS — R0600 Dyspnea, unspecified: Secondary | ICD-10-CM

## 2016-12-13 DIAGNOSIS — L749 Eccrine sweat disorder, unspecified: Secondary | ICD-10-CM | POA: Diagnosis not present

## 2016-12-13 LAB — EKG 12-LEAD: EKG 12 LEAD: NORMAL

## 2016-12-13 NOTE — Progress Notes (Signed)
Patient: Justin Warren Male    DOB: 1945-06-29   71 y.o.   MRN: [id number] Visit Date: 12/13/2016  Today's Provider: Vernie Murders, PA   Chief Complaint  Patient presents with  . Fatigue   Subjective:    HPI Fatigue: Patient complains of fatigue. Symptoms began several months ago. Sentinal symptom the patient feels fatigue began with: none. Symptoms of his fatigue have been lack of interest in usual activities. Patient describes the following psychologic symptoms: none.  Patient denies fever and significant change in weight. Symptoms have gradually worsened. Severity has been symptoms bothersome, but easily able to carry out all usual work/school/family activities. Previous visits for this problem: none.   Patient C/O sweats, patient reports symptoms have been present for several months. Patient reports that he has noticed that he sweats more after taking his prescription medications.   Patient C/O "being off balance" times several months. Patient reports symptoms are more noticeable in the morning and when standing from a sitting position.    No past medical history on file. Patient Active Problem List   Diagnosis Date Noted  . 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 Hx of decompressive lumbar laminectomy 04/09/2016  . Anxiety 03/07/2015  . Clinical depression 03/07/2015  . Difficulty hearing 03/07/2015  . Hypercholesteremia 03/07/2015  . 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 Acid reflux 06/26/2009  . External hemorrhoids without complication 40/98/1191  . 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 APPENDECTOMY  1994  . CARPAL TUNNEL RELEASE  2012  . LUMBAR LAMINECTOMIES  1976 & 1978   Dr. [name]  . SIGMOIDOSCOPY  2010   Family History  Problem Relation Age of Onset  . Dementia Mother   . GER disease Mother   . Hypertension Father     . Arrhythmia Sister   . Heart murmur Sister    Allergies  Allergen Reactions  . Amoxicillin-Pot Clavulanate     Current Outpatient Prescriptions:  .  Aspirin 81 MG EC tablet, Take by mouth., Disp: , Rfl:  .  busPIRone (BUSPAR) 15 MG tablet, TAKE 1 TABLET TWICE DAILY, Disp: 180 tablet, Rfl: 3 .  FLUoxetine (PROZAC) 20 MG capsule, TAKE 1 CAPSULE EVERY DAY, Disp: 90 capsule, Rfl: 3 .  ranitidine (ZANTAC) 150 MG tablet, TAKE 1 TABLET (150 MG TOTAL) TWICE DAILY, Disp: 180 tablet, Rfl: 3 .  simvastatin (ZOCOR) 20 MG tablet, TAKE 1 TABLET EVERY DAY, Disp: 90 tablet, Rfl: 3  Review of Systems  Constitutional: Positive for diaphoresis.  Endocrine: Positive for heat intolerance.  Neurological: Positive for dizziness (off balance).    Social History  Substance Use Topics  . Smoking status: Former Smoker    Packs/day: 2.00    Years: 30.00    Types: Cigarettes  . Smokeless tobacco: Never Used  . Alcohol use 0.0 oz/week     Comment: Occasionally    Objective:   BP 120/80 (BP Location: Right Arm, Patient Position: Sitting, Cuff Size: Large)   Pulse 68   Temp 97.8 F (36.6 C) (Oral)   Resp 16   Ht 5' 7"  (1.702 m)   Wt 199 lb (90.3 kg)   SpO2 96%   BMI 31.17 kg/m   Physical Exam  Constitutional: He is oriented to person, place, and time. He appears well-developed and well-nourished. No distress.  HENT:  Head: Normocephalic and atraumatic.  Right Ear:  Hearing and external ear normal.  Left Ear: Hearing and external ear normal.  Nose: Nose normal.  Mouth/Throat: Oropharynx is clear and moist.  Eyes: Conjunctivae, EOM and lids are normal. Right eye exhibits no discharge. Left eye exhibits no discharge. No scleral icterus.  Neck: Neck supple.  Cardiovascular: Normal rate and regular rhythm.   Pulmonary/Chest: Effort normal and breath sounds normal. No respiratory distress.  Abdominal: Soft. Bowel sounds are normal.  Musculoskeletal: Normal range of motion.  Neurological: He is  alert and oriented to person, place, and time.  Skin: Skin is intact. No lesion and no rash noted.  Psychiatric: He has a normal mood and affect. His speech is normal and behavior is normal. Thought content normal.      Assessment & Plan:     1. Dyspnea on exertion Onset over the past several months with fatigue, lightheaded sensation and sweating. Denies chest pain or palpitations. EKG essentially normal. History of smoking 3 ppd for 30 years. Will check CBC, CMP, TSH and chest x-ray. Recheck prn. - EKG 12-Lead - CBC with Differential/Platelet - Comprehensive metabolic panel - TSH - DG Chest 2 View  2. Sweating abnormality Noticed with fatigue and DOE since having a "heat stroke" a few years ago. Advised heat exposure will not be tolerated well and these symptoms return if he truly had a heat stroke. Check CBC, CMP and TSH. - CBC with Differential/Platelet - Comprehensive metabolic panel - TSH  3. Fatigue due to excessive exertion, initial encounter Onset several months ago with more prevalence with exertion. Will check labs for anemia, electrolyte imbalance, thyroid disease and other metabolic disorders. No syncope. - CBC with Differential/Platelet - Comprehensive metabolic panel - TSH       Vernie Murders, PA  [facility]

## 2016-12-15 ENCOUNTER — Encounter: Payer: Self-pay | Admitting: Family Medicine

## 2016-12-16 DIAGNOSIS — L749 Eccrine sweat disorder, unspecified: Secondary | ICD-10-CM | POA: Diagnosis not present

## 2016-12-16 DIAGNOSIS — T733XXA Exhaustion due to excessive exertion, initial encounter: Secondary | ICD-10-CM | POA: Diagnosis not present

## 2016-12-16 DIAGNOSIS — R0609 Other forms of dyspnea: Secondary | ICD-10-CM | POA: Diagnosis not present

## 2016-12-16 LAB — CBC WITH DIFFERENTIAL/PLATELET
BASOS ABS: 71 {cells}/uL (ref 0–200)
Basophils Relative: 1.4 %
Eosinophils Absolute: 92 cells/uL (ref 15–500)
Eosinophils Relative: 1.8 %
HCT: 44.2 % (ref 38.5–50.0)
HEMOGLOBIN: 15.1 g/dL (ref 13.2–17.1)
Lymphs Abs: 1556 cells/uL (ref 850–3900)
MCH: 28.5 pg (ref 27.0–33.0)
MCHC: 34.2 g/dL (ref 32.0–36.0)
MCV: 83.6 fL (ref 80.0–100.0)
MONOS PCT: 10.9 %
MPV: 10.2 fL (ref 7.5–12.5)
NEUTROS ABS: 2825 {cells}/uL (ref 1500–7800)
NEUTROS PCT: 55.4 %
Platelets: 221 10*3/uL (ref 140–400)
RBC: 5.29 10*6/uL (ref 4.20–5.80)
RDW: 13.7 % (ref 11.0–15.0)
TOTAL LYMPHOCYTE: 30.5 %
WBC mixed population: 556 cells/uL (ref 200–950)
WBC: 5.1 10*3/uL (ref 3.8–10.8)

## 2016-12-16 LAB — COMPREHENSIVE METABOLIC PANEL WITH GFR
AG Ratio: 2 (calc) (ref 1.0–2.5)
ALT: 23 U/L (ref 9–46)
AST: 24 U/L (ref 10–35)
Albumin: 4.5 g/dL (ref 3.6–5.1)
Alkaline phosphatase (APISO): 105 U/L (ref 40–115)
BUN: 15 mg/dL (ref 7–25)
CO2: 28 mmol/L (ref 20–32)
Calcium: 9.8 mg/dL (ref 8.6–10.3)
Chloride: 107 mmol/L (ref 98–110)
Creat: 1.02 mg/dL (ref 0.70–1.18)
Globulin: 2.3 g/dL (ref 1.9–3.7)
Glucose, Bld: 104 mg/dL — ABNORMAL HIGH (ref 65–99)
Potassium: 4.8 mmol/L (ref 3.5–5.3)
Sodium: 142 mmol/L (ref 135–146)
Total Bilirubin: 0.7 mg/dL (ref 0.2–1.2)
Total Protein: 6.8 g/dL (ref 6.1–8.1)

## 2016-12-16 LAB — TSH: TSH: 1.29 mIU/L (ref 0.40–4.50)

## 2016-12-16 LAB — SPECIMEN ID NOTIFICATION MISSING 2ND ID

## 2017-02-04 ENCOUNTER — Telehealth: Payer: Self-pay | Admitting: Family Medicine

## 2017-02-04 ENCOUNTER — Other Ambulatory Visit: Payer: Self-pay | Admitting: Family Medicine

## 2017-02-04 DIAGNOSIS — F419 Anxiety disorder, unspecified: Secondary | ICD-10-CM

## 2017-02-04 MED ORDER — BUSPIRONE HCL 15 MG PO TABS: 15.0000 mg | ORAL_TABLET | Freq: Two times a day (BID) | ORAL | 3 refills | Status: DC

## 2017-02-04 MED ORDER — RANITIDINE HCL 150 MG PO TABS: ORAL_TABLET | ORAL | 3 refills | Status: DC

## 2017-02-04 MED ORDER — SIMVASTATIN 20 MG PO TABS: 20.0000 mg | ORAL_TABLET | Freq: Every day | ORAL | 3 refills | Status: DC

## 2017-02-04 MED ORDER — FLUOXETINE HCL 20 MG PO CAPS: 20.0000 mg | ORAL_CAPSULE | Freq: Every day | ORAL | 3 refills | Status: DC

## 2017-02-04 NOTE — Telephone Encounter (Signed)
Pt states his medication to his mail order pharmacy has been messed up for months.  He states he would like for [place] to call [facility] and cx all prior refills/orders and start over so he is getting all meds at the same time.  States now me is getting a different medication every month or two.

## 2017-02-04 NOTE — Telephone Encounter (Signed)
Sent refills to [facility]. This is no guarantee they will all come to him at the same time.

## 2017-02-07 NOTE — Telephone Encounter (Signed)
Patient advised.

## 2017-03-29 HISTORY — PX: CATARACT EXTRACTION, BILATERAL: SHX1313

## 2017-03-31 ENCOUNTER — Telehealth: Payer: Self-pay | Admitting: Family Medicine

## 2017-04-05 ENCOUNTER — Ambulatory Visit: Payer: Self-pay | Admitting: Family Medicine

## 2017-04-05 ENCOUNTER — Encounter: Payer: Medicare HMO | Admitting: Family Medicine

## 2017-04-15 ENCOUNTER — Ambulatory Visit (INDEPENDENT_AMBULATORY_CARE_PROVIDER_SITE_OTHER): Payer: Medicare HMO | Admitting: Family Medicine

## 2017-04-15 ENCOUNTER — Encounter: Payer: Self-pay | Admitting: Family Medicine

## 2017-04-15 ENCOUNTER — Ambulatory Visit (INDEPENDENT_AMBULATORY_CARE_PROVIDER_SITE_OTHER): Payer: Medicare HMO

## 2017-04-15 VITALS — BP 142/78 | HR 80 | Temp 98.3°F | Ht 67.0 in | Wt 198.8 lb

## 2017-04-15 DIAGNOSIS — M21372 Foot drop, left foot: Secondary | ICD-10-CM | POA: Diagnosis not present

## 2017-04-15 DIAGNOSIS — Z0000 Encounter for general adult medical examination without abnormal findings: Secondary | ICD-10-CM | POA: Diagnosis not present

## 2017-04-15 DIAGNOSIS — K219 Gastro-esophageal reflux disease without esophagitis: Secondary | ICD-10-CM | POA: Diagnosis not present

## 2017-04-15 DIAGNOSIS — M21371 Foot drop, right foot: Secondary | ICD-10-CM

## 2017-04-15 DIAGNOSIS — I739 Peripheral vascular disease, unspecified: Secondary | ICD-10-CM | POA: Diagnosis not present

## 2017-04-15 DIAGNOSIS — E7800 Pure hypercholesterolemia, unspecified: Secondary | ICD-10-CM | POA: Diagnosis not present

## 2017-04-15 DIAGNOSIS — Z125 Encounter for screening for malignant neoplasm of prostate: Secondary | ICD-10-CM | POA: Diagnosis not present

## 2017-04-15 DIAGNOSIS — Z9889 Other specified postprocedural states: Secondary | ICD-10-CM

## 2017-04-15 NOTE — Patient Instructions (Signed)
Justin Warren , Thank you for taking time to come for your Medicare Wellness Visit. I appreciate your ongoing commitment to your health goals. Please review the following plan we discussed and let me know if I can assist you in the future.   Screening recommendations/referrals: Colonoscopy: Up to date Recommended yearly ophthalmology/optometry visit for glaucoma screening and checkup Recommended yearly dental visit for hygiene and checkup  Vaccinations: Influenza vaccine: Up to date Pneumococcal vaccine: Up to date Tdap vaccine: Pt declines today.  Shingles vaccine: Received Zostavax in 2012, will check with insurance first regarding Shingrix cost.   Advanced directives: Please bring a copy of your POA (Power of [place]) and/or Living Will to your next appointment.   Conditions/risks identified: Recommend increasing water intake to 4 glasses a day.   Next appointment: 9:00 AM today  Preventive Care 74 Years and Older, Male Preventive care refers to lifestyle choices and visits with your health care provider that can promote health and wellness. What does preventive care include?  A yearly physical exam. This is also called an annual well check.  Dental exams once or twice a year.  Routine eye exams. Ask your health care provider how often you should have your eyes checked.  Personal lifestyle choices, including:  Daily care of your teeth and gums.  Regular physical activity.  Eating a healthy diet.  Avoiding tobacco and drug use.  Limiting alcohol use.  Practicing safe sex.  Taking low doses of aspirin every day.  Taking vitamin and mineral supplements as recommended by your health care provider. What happens during an annual well check? The services and screenings done by your health care provider during your annual well check will depend on your age, overall health, lifestyle risk factors, and family history of disease. Counseling  Your health care provider may ask  you questions about your:  Alcohol use.  Tobacco use.  Drug use.  Emotional well-being.  Home and relationship well-being.  Sexual activity.  Eating habits.  History of falls.  Memory and ability to understand (cognition).  Work and work Statistician. Screening  You may have the following tests or measurements:  Height, weight, and BMI.  Blood pressure.  Lipid and cholesterol levels. These may be checked every 5 years, or more frequently if you are over 74 years old.  Skin check.  Lung cancer screening. You may have this screening every year starting at age 74 if you have a 30-pack-year history of smoking and currently smoke or have quit within the past 15 years.  Fecal occult blood test (FOBT) of the stool. You may have this test every year starting at age 74.  Flexible sigmoidoscopy or colonoscopy. You may have a sigmoidoscopy every 5 years or a colonoscopy every 10 years starting at age 74.  Prostate cancer screening. Recommendations will vary depending on your family history and other risks.  Hepatitis C blood test.  Hepatitis B blood test.  Sexually transmitted disease (STD) testing.  Diabetes screening. This is done by checking your blood sugar (glucose) after you have not eaten for a while (fasting). You may have this done every 1-3 years.  Abdominal aortic aneurysm (AAA) screening. You may need this if you are a current or former smoker.  Osteoporosis. You may be screened starting at age 50 if you are at high risk. Talk with your health care provider about your test results, treatment options, and if necessary, the need for more tests. Vaccines  Your health care provider may recommend certain  vaccines, such as:  Influenza vaccine. This is recommended every year.  Tetanus, diphtheria, and acellular pertussis (Tdap, Td) vaccine. You may need a Td booster every 10 years.  Zoster vaccine. You may need this after age 74.  Pneumococcal 13-valent conjugate  (PCV13) vaccine. One dose is recommended after age 15.  Pneumococcal polysaccharide (PPSV23) vaccine. One dose is recommended after age 31. Talk to your health care provider about which screenings and vaccines you need and how often you need them. This information is not intended to replace advice given to you by your health care provider. Make sure you discuss any questions you have with your health care provider. Document Released: 04/11/2015 Document Revised: 12/03/2015 Document Reviewed: 01/14/2015 Elsevier Interactive Patient Education  2017 [place] Prevention in the Home Falls can cause injuries. They can happen to people of all ages. There are many things you can do to make your home safe and to help prevent falls. What can I do on the outside of my home?  Regularly fix the edges of walkways and driveways and fix any cracks.  Remove anything that might make you trip as you walk through a door, such as a raised step or threshold.  Trim any bushes or trees on the path to your home.  Use bright outdoor lighting.  Clear any walking paths of anything that might make someone trip, such as rocks or tools.  Regularly check to see if handrails are loose or broken. Make sure that both sides of any steps have handrails.  Any raised decks and porches should have guardrails on the edges.  Have any leaves, snow, or ice cleared regularly.  Use sand or salt on walking paths during winter.  Clean up any spills in your garage right away. This includes oil or grease spills. What can I do in the bathroom?  Use night lights.  Install grab bars by the toilet and in the tub and shower. Do not use towel bars as grab bars.  Use non-skid mats or decals in the tub or shower.  If you need to sit down in the shower, use a plastic, non-slip stool.  Keep the floor dry. Clean up any water that spills on the floor as soon as it happens.  Remove soap buildup in the tub or shower  regularly.  Attach bath mats securely with double-sided non-slip rug tape.  Do not have throw rugs and other things on the floor that can make you trip. What can I do in the bedroom?  Use night lights.  Make sure that you have a light by your bed that is easy to reach.  Do not use any sheets or blankets that are too big for your bed. They should not hang down onto the floor.  Have a firm chair that has side arms. You can use this for support while you get dressed.  Do not have throw rugs and other things on the floor that can make you trip. What can I do in the kitchen?  Clean up any spills right away.  Avoid walking on wet floors.  Keep items that you use a lot in easy-to-reach places.  If you need to reach something above you, use a strong step stool that has a grab bar.  Keep electrical cords out of the way.  Do not use floor polish or wax that makes floors slippery. If you must use wax, use non-skid floor wax.  Do not have throw rugs and other things  on the floor that can make you trip. What can I do with my stairs?  Do not leave any items on the stairs.  Make sure that there are handrails on both sides of the stairs and use them. Fix handrails that are broken or loose. Make sure that handrails are as long as the stairways.  Check any carpeting to make sure that it is firmly attached to the stairs. Fix any carpet that is loose or worn.  Avoid having throw rugs at the top or bottom of the stairs. If you do have throw rugs, attach them to the floor with carpet tape.  Make sure that you have a light switch at the top of the stairs and the bottom of the stairs. If you do not have them, ask someone to add them for you. What else can I do to help prevent falls?  Wear shoes that:  Do not have high heels.  Have rubber bottoms.  Are comfortable and fit you well.  Are closed at the toe. Do not wear sandals.  If you use a stepladder:  Make sure that it is fully  opened. Do not climb a closed stepladder.  Make sure that both sides of the stepladder are locked into place.  Ask someone to hold it for you, if possible.  Clearly mark and make sure that you can see:  Any grab bars or handrails.  First and last steps.  Where the edge of each step is.  Use tools that help you move around (mobility aids) if they are needed. These include:  Canes.  Walkers.  Scooters.  Crutches.  Turn on the lights when you go into a dark area. Replace any light bulbs as soon as they burn out.  Set up your furniture so you have a clear path. Avoid moving your furniture around.  If any of your floors are uneven, fix them.  If there are any pets around you, be aware of where they are.  Review your medicines with your doctor. Some medicines can make you feel dizzy. This can increase your chance of falling. Ask your doctor what other things that you can do to help prevent falls. This information is not intended to replace advice given to you by your health care provider. Make sure you discuss any questions you have with your health care provider. Document Released: 01/09/2009 Document Revised: 08/21/2015 Document Reviewed: 04/19/2014 Elsevier Interactive Patient Education  2017 [employer].

## 2017-04-15 NOTE — Progress Notes (Addendum)
Patient: Justin Warren, Male    DOB: December 12, 1945, 72 y.o.   MRN: [id number] Visit Date: 04/15/2017  Today's Provider: Vernie Murders, PA   Chief Complaint  Patient presents with  . Annual Exam   Subjective:    Annual physical exam Justin Warren is a 72 y.o. male who presents today for health maintenance and complete physical. He feels fairly well. He reports exercising lightly. He reports he is sleeping well.  -----------------------------------------------------------------   Review of Systems  Constitutional: Positive for activity change.  HENT: Negative.   Eyes: Negative.   Respiratory: Negative.   Cardiovascular: Negative.   Gastrointestinal: Negative.   Endocrine: Positive for heat intolerance.  Genitourinary: Positive for frequency.  Musculoskeletal: Positive for gait problem and neck pain.  Skin: Negative.   Allergic/Immunologic: Negative.   Hematological: Bruises/bleeds easily.  Psychiatric/Behavioral: Negative.     Social History      He  reports that he has quit smoking. His smoking use included cigarettes. He has a 60.00 pack-year smoking history. he has never used smokeless tobacco. He reports that he drinks alcohol. He reports that he does not use drugs.       Social History   Socioeconomic History  . Marital status: Married    Spouse name: None  . Number of children: 3  . Years of education: None  . Highest education level: Bachelor's degree (e.g., BA, AB, BS)  Social Needs  . Financial resource strain: Not hard at all  . Food insecurity - worry: Never true  . Food insecurity - inability: Never true  . Transportation needs - medical: No  . Transportation needs - non-medical: No  Occupational History  . Occupation: retired  Tobacco Use  . Smoking status: Former Smoker    Packs/day: 2.00    Years: 30.00    Pack years: 60.00    Types: Cigarettes  . Smokeless tobacco: Never Used  . Tobacco comment: quit in 1991-1992  Substance and  Sexual Activity  . Alcohol use: Yes    Alcohol/week: 0.0 oz    Frequency: Never    Comment: wine rare  . Drug use: No  . Sexual activity: None  Other Topics Concern  . None  Social History Narrative  . None    History reviewed. No pertinent past medical history.   Patient Active Problem List   Diagnosis Date Noted  . 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 Hx of decompressive lumbar laminectomy 04/09/2016  . Anxiety 03/07/2015  . Clinical depression 03/07/2015  . Difficulty hearing 03/07/2015  . Hypercholesteremia 03/07/2015  . 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 Acid reflux 06/26/2009  . External hemorrhoids without complication 54/65/6812  . 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 APPENDECTOMY  1994  . CARPAL TUNNEL RELEASE  2012  . LUMBAR LAMINECTOMIES  1976 & 1978   Dr. [name]  . SIGMOIDOSCOPY  2010    Family History        Family Status  Relation Name Status  . Mother  Deceased at age 69  . Father  Deceased at age 23  . Sister  Alive  . Daughter  (Not Specified)       blood clot        His family history includes Arrhythmia in his sister; Dementia in his mother; GER disease in his mother; Heart murmur in his sister;  Hypertension in his father.     Allergies  Allergen Reactions  . Amoxicillin-Pot Clavulanate      Current Outpatient Medications:  .  Aspirin 81 MG EC tablet, Take 81 mg by mouth daily. , Disp: , Rfl:  .  busPIRone (BUSPAR) 15 MG tablet, Take 1 tablet (15 mg total) 2 (two) times daily by mouth., Disp: 180 tablet, Rfl: 3 .  FLUoxetine (PROZAC) 20 MG capsule, Take 1 capsule (20 mg total) daily by mouth., Disp: 90 capsule, Rfl: 3 .  ranitidine (ZANTAC) 150 MG tablet, TAKE 1 TABLET (150 MG TOTAL) TWICE DAILY, Disp: 180 tablet, Rfl: 3 .  simvastatin (ZOCOR) 20 MG tablet, Take 1 tablet (20 mg total) daily by  mouth., Disp: 90 tablet, Rfl: 3   Patient Care Team: Chrismon, Vickki Muff, PA as PCP - General (Family Medicine) Patty, A. Joneen Caraway, MD as Consulting Physician (Ophthalmology) Alanda Slim, Neena Rhymes, MD as Consulting Physician (Ophthalmology)      Objective:   Vitals: BP (!) 142/78 (BP Location: Right Arm, Patient Position: Sitting, Cuff Size: Normal)   Pulse 80   Temp 98.3 F (36.8 C) (Oral)   Ht 5' 7"  (1.702 m)   Wt 198 lb 12.8 oz (90.2 kg)   BMI 31.14 kg/m  Wt Readings from Last 3 Encounters:  04/15/17 198 lb 12.8 oz (90.2 kg)  04/15/17 198 lb 12.8 oz (90.2 kg)  12/13/16 199 lb (90.3 kg)    Physical Exam  Constitutional: He is oriented to person, place, and time. He appears well-developed and well-nourished.  HENT:  Head: Normocephalic and atraumatic.  Right Ear: External ear normal.  Left Ear: External ear normal.  Nose: Nose normal.  Mouth/Throat: Oropharynx is clear and moist.  Eyes: Conjunctivae and EOM are normal. Pupils are equal, round, and reactive to light. Right eye exhibits no discharge.  Neck: Normal range of motion. Neck supple. No tracheal deviation present. No thyromegaly present.  Cardiovascular: Normal rate, regular rhythm, normal heart sounds and intact distal pulses.  No murmur heard. Pulmonary/Chest: Effort normal and breath sounds normal. No respiratory distress. He has no wheezes. He has no rales. He exhibits no tenderness.  Abdominal: Soft. He exhibits no distension and no mass. There is no tenderness. There is no rebound and no guarding.  Musculoskeletal: Normal range of motion. He exhibits no edema or tenderness.  Numbness and weakness in both feet - unable to dorsiflex either foot. Cold slightly red lower legs and feet. Pulses 1+ pedal and tibial bilateral.  Lymphadenopathy:    He has no cervical adenopathy.  Neurological: He is alert and oriented to person, place, and time. He has normal reflexes. No cranial nerve deficit. He exhibits normal muscle  tone. Coordination normal.  Skin: Skin is warm and dry. No rash noted. No erythema.  Psychiatric: He has a normal mood and affect. His behavior is normal. Judgment and thought content normal.    Depression Screen PHQ 2/9 Scores 04/15/2017 12/13/2016 04/09/2016 04/03/2015  PHQ - 2 Score 0 0 0 0  PHQ- 9 Score - 4 - -    Assessment & Plan:     Routine Health Maintenance and Physical Exam  Exercise Activities and Dietary recommendations Goals    . DIET - INCREASE WATER INTAKE     Recommend increasing water intake to 4 glasses a day.        Immunization History  Administered Date(s) Administered  . Influenza Split 01/08/2010  . Influenza, High Dose Seasonal PF 04/01/2014  . Influenza,inj,Quad PF,6+  Mos 03/20/2013  . Influenza-Unspecified 01/18/2015  . Pneumococcal Conjugate-13 04/01/2014  . Pneumococcal Polysaccharide-23 04/03/2015  . Zoster 03/01/2011    Health Maintenance  Topic Date Due  . TETANUS/TDAP  10/30/2016  . COLONOSCOPY  12/07/2022  . INFLUENZA VACCINE  Completed  . Hepatitis C Screening  Completed  . PNA vac Low Risk Adult  Completed    Discussed health benefits of physical activity, and encouraged him to engage in regular exercise appropriate for his age and condition.    --------------------------------------------------------------------  1. Hypercholesteremia Tolerating Simvastatin 20 mg qd without side effects. Trying to restrict fats in diet and continues to physically active around his home and yard. Recheck CMP, lipid panel and TSH. Follow up pending reports. - Comprehensive metabolic panel - Lipid panel - TSH  2. Gastroesophageal reflux disease, esophagitis presence not specified Symptoms controlled by using the Ranitidine 150 mg BID and restricting acidic or spicy foods. No melena or hematemesis. Check CBC for signs of anemia. May need to consider GI referral for upper endoscopy if symptoms escalate. - CBC with Differential/Platelet  3. PAD  (peripheral artery disease) (HCC) Numbness and cool lower extremities. Pulse palpable in feet. Will check labs and schedule for ABI. May need vascular referral. - CBC with Differential/Platelet  4. Hx of decompressive lumbar laminectomy Had lumbar laminectomies by Dr. [name] (neurosurgeon) in [place]. Suspect foot drop on the right (side of radicular symptoms prior to laminectomies) may be related. Surgical scars well healed and good ROM.  5. Bilateral foot-drop Progressing over the past year. No falls but tripping up at times. Weak dorsiflexion of both feet with some numbness (R>L). Will check labs and schedule neurology referral and check circulation. - CBC with Differential/Platelet - Comprehensive metabolic panel - Ambulatory referral to Neurology  6. Screening PSA (prostate specific antigen) Asymptomatic. - PSA   Vernie Murders, PA  [facility]

## 2017-04-15 NOTE — Progress Notes (Signed)
Subjective:   Justin Warren is a 72 y.o. male who presents for Medicare Annual/Subsequent preventive examination.  Review of Systems:  N/A  Cardiac Risk Factors include: advanced age (>16mn, >>91women);dyslipidemia;hypertension;male gender     Objective:    Vitals: BP (!) 142/78 (BP Location: Right Arm)   Pulse 80   Temp 98.3 F (36.8 C) (Oral)   Ht 5' 7"  (1.702 m)   Wt 198 lb 12.8 oz (90.2 kg)   BMI 31.14 kg/m   Body mass index is 31.14 kg/m.  Advanced Directives 04/15/2017  Does Patient Have a Medical Advance Directive? Yes  Type of AParamedicof APellaLiving will  Copy of [place] Chart? No - copy requested    Tobacco Social History   Tobacco Use  Smoking Status Former Smoker  . Packs/day: 2.00  . Years: 30.00  . Pack years: 60.00  . Types: Cigarettes  Smokeless Tobacco Never Used  Tobacco Comment   quit in 1991-1992     Counseling given: Not Answered Comment: quit in 1991-1992   Clinical Intake:  Pre-visit preparation completed: Yes  Pain : 0-10 Pain Score: 2  Pain Type: Chronic pain Pain Location: Back(and neck ) Pain Orientation: Upper Pain Descriptors / Indicators: Aching Pain Frequency: Intermittent     Nutritional Status: BMI > 30  Obese Nutritional Risks: None  How often do you need to have someone help you when you read instructions, pamphlets, or other written materials from your doctor or pharmacy?: 1 - Never  Interpreter Needed?: No  Information [facility] :: [facility]  History reviewed. No pertinent past medical history. Past Surgical History:  Procedure Laterality Date  . APPENDECTOMY  1994  . CARPAL TUNNEL RELEASE  2012  . LUMBAR LAMINECTOMIES  1976 & 1978   Dr. DRolin Warren . SIGMOIDOSCOPY  2010   Family History  Problem Relation Age of Onset  . Dementia Mother   . GER disease Mother   . Hypertension Father   . Arrhythmia Sister   . Heart murmur Sister     Social History   Socioeconomic History  . Marital status: Married    Spouse name: None  . Number of children: 3  . Years of education: None  . Highest education level: Bachelor's degree (e.g., BA, AB, BS)  Social Needs  . Financial resource strain: Not hard at all  . Food insecurity - worry: Never true  . Food insecurity - inability: Never true  . Transportation needs - medical: No  . Transportation needs - non-medical: No  Occupational History  . Occupation: retired  Tobacco Use  . Smoking status: Former Smoker    Packs/day: 2.00    Years: 30.00    Pack years: 60.00    Types: Cigarettes  . Smokeless tobacco: Never Used  . Tobacco comment: quit in 1991-1992  Substance and Sexual Activity  . Alcohol use: Yes    Alcohol/week: 0.0 oz    Frequency: Never    Comment: wine rare  . Drug use: No  . Sexual activity: None  Other Topics Concern  . None  Social History Narrative  . None    Outpatient Encounter Medications as of 04/15/2017  Medication Sig  . Aspirin 81 MG EC tablet Take 81 mg by mouth daily.   . busPIRone (BUSPAR) 15 MG tablet Take 1 tablet (15 mg total) 2 (two) times daily by mouth.  .Marland KitchenFLUoxetine (PROZAC) 20 MG capsule Take 1 capsule (20 mg  total) daily by mouth.  . ranitidine (ZANTAC) 150 MG tablet TAKE 1 TABLET (150 MG TOTAL) TWICE DAILY  . simvastatin (ZOCOR) 20 MG tablet Take 1 tablet (20 mg total) daily by mouth.   No facility-administered encounter medications on file as of 04/15/2017.     Activities of Daily Living In your present state of health, do you have any difficulty performing the following activities: 04/15/2017  Hearing? N  Vision? N  Difficulty concentrating or making decisions? N  Comment Due to back/neck/ foot pain.  Walking or climbing stairs? Y  Dressing or bathing? N  Doing errands, shopping? N  Preparing Food and eating ? N  Using the Toilet? N  In the past six months, have you accidently leaked urine? N  Do you have problems  with loss of bowel control? N  Managing your Medications? N  Managing your Finances? N  Housekeeping or managing your Housekeeping? N  Some recent data might be hidden    Patient Care Team: Justin Warren, Justin Muff, PA as PCP - General (Family Medicine) Justin Warren, A. Joneen Caraway, Justin Warren as Consulting Physician (Ophthalmology) Justin Slim Neena Rhymes, Justin Warren as Consulting Physician (Ophthalmology)   Assessment:   This is a routine wellness examination for Justin Warren.  Exercise Activities and Dietary recommendations Current Exercise Habits: The patient does not participate in regular exercise at present, Exercise limited by: Other - see comments(stays active doing other things but nothing structured)  Goals    . DIET - INCREASE WATER INTAKE     Recommend increasing water intake to 4 glasses a day.        Fall Risk Fall Risk  04/15/2017 12/13/2016 04/09/2016 04/03/2015  Falls in the past year? Yes No Yes No  Number falls in past yr: 2 or more - 1 -  Injury with Fall? No - No -  Follow up Falls prevention discussed - - -   Is the patient's home free of loose throw rugs in walkways, pet beds, electrical cords, etc?   yes      Grab bars in the bathroom? yes      Handrails on the stairs?   yes      Adequate lighting?   yes  Timed Get Up and Go Performed: N/A  Depression Screen PHQ 2/9 Scores 04/15/2017 12/13/2016 04/09/2016 04/03/2015  PHQ - 2 Score 0 0 0 0  PHQ- 9 Score - 4 - -    Cognitive Function     6CIT Screen 04/15/2017  What Year? 0 points  What month? 0 points  What time? 0 points  Count back from 20 0 points  Months in reverse 0 points  Repeat phrase 0 points  Total Score 0    Immunization History  Administered Date(s) Administered  . Influenza Split 01/08/2010  . Influenza, High Dose Seasonal PF 04/01/2014  . Influenza,inj,Quad PF,6+ Mos 03/20/2013  . Influenza-Unspecified 01/18/2015  . Pneumococcal Conjugate-13 04/01/2014  . Pneumococcal Polysaccharide-23 04/03/2015  . Zoster 03/01/2011     Qualifies for Shingles Vaccine?  Due for Shingles vaccine. Declined my offer to administer today. Education has been provided regarding the importance of this vaccine. Pt has been advised to call her insurance company to determine her out of pocket expense. Advised she may also receive this vaccine at her local pharmacy or Health Dept. Verbalized acceptance and understanding.  Screening Tests Health Maintenance  Topic Date Due  . TETANUS/TDAP  10/30/2016  . COLONOSCOPY  12/07/2022  . INFLUENZA VACCINE  Completed  . Hepatitis C Screening  Completed  . PNA vac Low Risk Adult  Completed   Cancer Screenings: Lung: Low Dose CT Chest recommended if Age 65-80 years, 30 pack-year currently smoking OR have quit w/in 15years. Patient does not qualify. Colorectal: Up to date  Additional Screenings:  Hepatitis B/HIV/Syphillis: Pt declines today.  Hepatitis C Screening: Up to date    Plan:  I have personally reviewed and addressed the Medicare Annual Wellness questionnaire and have noted the following in the patient's chart:  A. Medical and social history B. Use of alcohol, tobacco or illicit drugs  C. Current medications and supplements D. Functional ability and status E.  Nutritional status F.  Physical activity G. Advance directives H. List of other physicians I.  Hospitalizations, surgeries, and ER visits in previous 12 months J.  Talpa such as hearing and vision if needed, cognitive and depression L. Referrals and appointments - none  In addition, I have reviewed and discussed with patient certain preventive protocols, quality metrics, and best practice recommendations. A written personalized care plan for preventive services as well as general preventive health recommendations were provided to patient.  See attached scanned questionnaire for additional information.   Signed,  Fabio Neighbors, LPN Nurse Health Advisor   Nurse Recommendations: Pt states he has  had the tetanus vaccine in 2012 at [facility]. Pt to fill out release form and update records once received.   Reviewed Nurse Health Advisor's documentation and recommendations. Agree with plan and recommendations.

## 2017-04-20 DIAGNOSIS — M21372 Foot drop, left foot: Secondary | ICD-10-CM | POA: Diagnosis not present

## 2017-04-20 DIAGNOSIS — Z125 Encounter for screening for malignant neoplasm of prostate: Secondary | ICD-10-CM | POA: Diagnosis not present

## 2017-04-20 DIAGNOSIS — K219 Gastro-esophageal reflux disease without esophagitis: Secondary | ICD-10-CM | POA: Diagnosis not present

## 2017-04-20 DIAGNOSIS — M21371 Foot drop, right foot: Secondary | ICD-10-CM | POA: Diagnosis not present

## 2017-04-20 DIAGNOSIS — I739 Peripheral vascular disease, unspecified: Secondary | ICD-10-CM | POA: Diagnosis not present

## 2017-04-20 DIAGNOSIS — E7800 Pure hypercholesterolemia, unspecified: Secondary | ICD-10-CM | POA: Diagnosis not present

## 2017-04-21 LAB — LIPID PANEL
CHOL/HDL RATIO: 3.8 ratio (ref 0.0–5.0)
Cholesterol, Total: 151 mg/dL (ref 100–199)
HDL: 40 mg/dL (ref >39)
LDL Calculated: 85 mg/dL (ref 0–99)
Triglycerides: 132 mg/dL (ref 0–149)
VLDL Cholesterol Cal: 26 mg/dL (ref 5–40)

## 2017-04-21 LAB — CBC WITH DIFFERENTIAL/PLATELET
BASOS: 1 %
Basophils Absolute: 0.1 10*3/uL (ref 0.0–0.2)
EOS (ABSOLUTE): 0.1 10*3/uL (ref 0.0–0.4)
Eos: 2 %
Hematocrit: 44.6 % (ref 37.5–51.0)
Hemoglobin: 15.4 g/dL (ref 13.0–17.7)
IMMATURE GRANULOCYTES: 0 %
Immature Grans (Abs): 0 10*3/uL (ref 0.0–0.1)
Lymphocytes Absolute: 1.6 10*3/uL (ref 0.7–3.1)
Lymphs: 31 %
MCH: 29.2 pg (ref 26.6–33.0)
MCHC: 34.5 g/dL (ref 31.5–35.7)
MCV: 85 fL (ref 79–97)
MONOS ABS: 0.6 10*3/uL (ref 0.1–0.9)
Monocytes: 11 %
NEUTROS ABS: 2.8 10*3/uL (ref 1.4–7.0)
NEUTROS PCT: 55 %
Platelets: 218 10*3/uL (ref 150–379)
RBC: 5.27 x10E6/uL (ref 4.14–5.80)
RDW: 14.4 % (ref 12.3–15.4)
WBC: 5.1 10*3/uL (ref 3.4–10.8)

## 2017-04-21 LAB — COMPREHENSIVE METABOLIC PANEL
A/G RATIO: 2 (ref 1.2–2.2)
ALT: 27 IU/L (ref 0–44)
AST: 28 IU/L (ref 0–40)
Albumin: 4.6 g/dL (ref 3.5–4.8)
Alkaline Phosphatase: 105 IU/L (ref 39–117)
BUN/Creatinine Ratio: 12 (ref 10–24)
BUN: 13 mg/dL (ref 8–27)
Bilirubin Total: 0.6 mg/dL (ref 0.0–1.2)
CALCIUM: 10.1 mg/dL (ref 8.6–10.2)
CHLORIDE: 102 mmol/L (ref 96–106)
CO2: 24 mmol/L (ref 20–29)
Creatinine, Ser: 1.09 mg/dL (ref 0.76–1.27)
GFR, EST AFRICAN AMERICAN: 77 mL/min/{1.73_m2} (ref >59)
GFR, EST NON AFRICAN AMERICAN: 67 mL/min/{1.73_m2} (ref >59)
GLOBULIN, TOTAL: 2.3 g/dL (ref 1.5–4.5)
Glucose: 104 mg/dL — ABNORMAL HIGH (ref 65–99)
POTASSIUM: 4.3 mmol/L (ref 3.5–5.2)
SODIUM: 143 mmol/L (ref 134–144)
TOTAL PROTEIN: 6.9 g/dL (ref 6.0–8.5)

## 2017-04-21 LAB — PSA: Prostate Specific Ag, Serum: 0.6 ng/mL (ref 0.0–4.0)

## 2017-04-21 LAB — TSH: TSH: 1.3 u[IU]/mL (ref 0.450–4.500)

## 2017-04-22 ENCOUNTER — Ambulatory Visit (INDEPENDENT_AMBULATORY_CARE_PROVIDER_SITE_OTHER): Payer: Medicare HMO

## 2017-04-22 DIAGNOSIS — R200 Anesthesia of skin: Secondary | ICD-10-CM

## 2017-04-22 NOTE — Progress Notes (Signed)
Advised  ED 

## 2017-05-02 DIAGNOSIS — M21372 Foot drop, left foot: Secondary | ICD-10-CM

## 2017-05-02 DIAGNOSIS — M21371 Foot drop, right foot: Secondary | ICD-10-CM | POA: Diagnosis not present

## 2017-05-04 NOTE — Telephone Encounter (Signed)
Visit complete.

## 2017-05-23 DIAGNOSIS — D0461 Carcinoma in situ of skin of right upper limb, including shoulder: Secondary | ICD-10-CM | POA: Diagnosis not present

## 2017-05-23 DIAGNOSIS — L821 Other seborrheic keratosis: Secondary | ICD-10-CM | POA: Diagnosis not present

## 2017-05-23 DIAGNOSIS — Z08 Encounter for follow-up examination after completed treatment for malignant neoplasm: Secondary | ICD-10-CM | POA: Diagnosis not present

## 2017-05-23 DIAGNOSIS — D485 Neoplasm of uncertain behavior of skin: Secondary | ICD-10-CM | POA: Diagnosis not present

## 2017-05-23 DIAGNOSIS — Z85828 Personal history of other malignant neoplasm of skin: Secondary | ICD-10-CM | POA: Diagnosis not present

## 2017-06-02 ENCOUNTER — Encounter: Payer: Self-pay | Admitting: Family Medicine

## 2017-06-02 ENCOUNTER — Ambulatory Visit (INDEPENDENT_AMBULATORY_CARE_PROVIDER_SITE_OTHER): Payer: Medicare HMO | Admitting: Family Medicine

## 2017-06-02 VITALS — BP 140/76 | HR 61 | Temp 98.2°F | Wt 201.6 lb

## 2017-06-02 DIAGNOSIS — J069 Acute upper respiratory infection, unspecified: Secondary | ICD-10-CM | POA: Diagnosis not present

## 2017-06-02 NOTE — Progress Notes (Signed)
Patient: Justin Warren Male    DOB: 1945/11/10   72 y.o.   MRN: [id number] Visit Date: 06/02/2017  Today's Provider: Vernie Murders, PA   Chief Complaint  Patient presents with  . URI   Subjective:    URI   This is a new problem. Episode onset: Monday afternoon. The problem has been unchanged. There has been no fever. Associated symptoms include congestion, coughing, headaches and a sore throat (scratchy). Treatments tried: Mucinex. The treatment provided mild relief.   History reviewed. No pertinent past medical history. Past Surgical History:  Procedure Laterality Date  . APPENDECTOMY  1994  . CARPAL TUNNEL RELEASE  2012  . LUMBAR LAMINECTOMIES  1976 & 1978   Dr. [name]  . SIGMOIDOSCOPY  2010   Family History  Problem Relation Age of Onset  . Dementia Mother   . GER disease Mother   . Hypertension Father   . Arrhythmia Sister   . Heart murmur Sister    Allergies  Allergen Reactions  . Amoxicillin-Pot Clavulanate     Other reaction(s): Other (See Comments)    Current Outpatient Medications:  .  Aspirin 81 MG EC tablet, Take 81 mg by mouth daily. , Disp: , Rfl:  .  busPIRone (BUSPAR) 15 MG tablet, Take 1 tablet (15 mg total) 2 (two) times daily by mouth., Disp: 180 tablet, Rfl: 3 .  FLUoxetine (PROZAC) 20 MG capsule, Take 1 capsule (20 mg total) daily by mouth., Disp: 90 capsule, Rfl: 3 .  ranitidine (ZANTAC) 150 MG tablet, TAKE 1 TABLET (150 MG TOTAL) TWICE DAILY, Disp: 180 tablet, Rfl: 3 .  simvastatin (ZOCOR) 20 MG tablet, Take 1 tablet (20 mg total) daily by mouth., Disp: 90 tablet, Rfl: 3  Review of Systems  Constitutional: Negative.   HENT: Positive for congestion and sore throat (scratchy).   Respiratory: Positive for cough.   Cardiovascular: Negative.   Neurological: Positive for headaches.   Social History   Tobacco Use  . Smoking status: Former Smoker    Packs/day: 2.00    Years: 30.00    Pack years: 60.00    Types: Cigarettes  .  Smokeless tobacco: Never Used  . Tobacco comment: quit in 1991-1992  Substance Use Topics  . Alcohol use: Yes    Alcohol/week: 0.0 oz    Frequency: Never    Comment: wine rare   Objective:   BP 140/76 (BP Location: Right Arm, Patient Position: Sitting, Cuff Size: Normal)   Pulse 61   Temp 98.2 F (36.8 C) (Oral)   Wt 201 lb 9.6 oz (91.4 kg)   SpO2 96%   BMI 31.58 kg/m   Physical Exam  Constitutional: He is oriented to person, place, and time. He appears well-developed and well-nourished. No distress.  HENT:  Head: Normocephalic and atraumatic.  Right Ear: Hearing normal.  Left Ear: Hearing normal.  Nose: Nose normal.  Eyes: Conjunctivae and lids are normal. Right eye exhibits no discharge. Left eye exhibits no discharge. No scleral icterus.  Neck: Neck supple.  Cardiovascular: Normal rate and regular rhythm.  Pulmonary/Chest: Effort normal and breath sounds normal. No respiratory distress.  Abdominal: Soft. Bowel sounds are normal.  Musculoskeletal: Normal range of motion.  Lymphadenopathy:    He has no cervical adenopathy.  Neurological: He is alert and oriented to person, place, and time.  Skin: Skin is intact. No lesion and no rash noted.  Psychiatric: He has a normal mood and affect. His speech is normal  and behavior is normal. Thought content normal.      Assessment & Plan:     1. URI with cough and congestion Onset with cough and congestion 05-30-17. No fever or chills. Some scratchy throat with slight left frontal headache. Tried Mucinex which has helped control symptoms. Increase fluid intake. No sign of pneumonia or bronchitis on exam. Sinuses transilluminate well. Increase fluid intake and may use Advil or Tylenol prn. Recheck prn.       Vernie Murders, PA  [facility]

## 2017-06-06 ENCOUNTER — Telehealth: Payer: Self-pay | Admitting: Family Medicine

## 2017-06-06 ENCOUNTER — Other Ambulatory Visit: Payer: Self-pay | Admitting: Family Medicine

## 2017-06-06 DIAGNOSIS — R05 Cough: Secondary | ICD-10-CM

## 2017-06-06 DIAGNOSIS — R059 Cough, unspecified: Secondary | ICD-10-CM

## 2017-06-06 MED ORDER — HYDROCODONE-HOMATROPINE 5-1.5 MG/5ML PO SYRP: 5.0000 mL | ORAL_SOLUTION | Freq: Three times a day (TID) | ORAL | 0 refills | Status: DC | PRN

## 2017-06-06 NOTE — Telephone Encounter (Signed)
Called in Rx as below. Patient  Was advised.

## 2017-06-06 NOTE — Telephone Encounter (Signed)
Phone in Hycodan Syrup 1 tsp QID by mouth prn cough 120 ml.

## 2017-06-06 NOTE — Progress Notes (Signed)
Cough worse but beginning to break up bronchitis. Requests stronger cough medication. Treat with Hycodan and recheck if no better in 5 days.

## 2017-06-06 NOTE — Telephone Encounter (Signed)
Patient called stating that the stuff in his chest is breaking up.  He has coughed and coughed till he has a headache. Congestion is moving to his head.   Has been taking Mucinex (daytime/night time doses).  He wants something else called in for his cough please.  [facility]

## 2017-06-13 DIAGNOSIS — M21372 Foot drop, left foot: Secondary | ICD-10-CM | POA: Diagnosis not present

## 2017-06-13 DIAGNOSIS — M21371 Foot drop, right foot: Secondary | ICD-10-CM | POA: Diagnosis not present

## 2017-07-07 DIAGNOSIS — D0461 Carcinoma in situ of skin of right upper limb, including shoulder: Secondary | ICD-10-CM | POA: Diagnosis not present

## 2017-08-24 ENCOUNTER — Encounter: Payer: Self-pay | Admitting: Family Medicine

## 2017-09-07 ENCOUNTER — Other Ambulatory Visit: Payer: Self-pay

## 2017-09-07 ENCOUNTER — Emergency Department
Admission: EM | Admit: 2017-09-07 | Discharge: 2017-09-07 | Disposition: A | Discharge status: Home / Self Care | Payer: Medicare HMO | Attending: Emergency Medicine | Admitting: Emergency Medicine

## 2017-09-07 ENCOUNTER — Encounter: Payer: Self-pay | Admitting: Emergency Medicine

## 2017-09-07 ENCOUNTER — Emergency Department: Payer: Medicare HMO

## 2017-09-07 DIAGNOSIS — Z87891 Personal history of nicotine dependence: Secondary | ICD-10-CM | POA: Diagnosis not present

## 2017-09-07 DIAGNOSIS — W19XXXA Unspecified fall, initial encounter: Secondary | ICD-10-CM | POA: Insufficient documentation

## 2017-09-07 DIAGNOSIS — Y999 Unspecified external cause status: Secondary | ICD-10-CM | POA: Diagnosis not present

## 2017-09-07 DIAGNOSIS — S20212A Contusion of left front wall of thorax, initial encounter: Secondary | ICD-10-CM | POA: Insufficient documentation

## 2017-09-07 DIAGNOSIS — Z79899 Other long term (current) drug therapy: Secondary | ICD-10-CM | POA: Diagnosis not present

## 2017-09-07 DIAGNOSIS — S299XXA Unspecified injury of thorax, initial encounter: Secondary | ICD-10-CM | POA: Diagnosis not present

## 2017-09-07 DIAGNOSIS — S20211A Contusion of right front wall of thorax, initial encounter: Secondary | ICD-10-CM | POA: Diagnosis not present

## 2017-09-07 DIAGNOSIS — Z7982 Long term (current) use of aspirin: Secondary | ICD-10-CM | POA: Insufficient documentation

## 2017-09-07 DIAGNOSIS — Y929 Unspecified place or not applicable: Secondary | ICD-10-CM | POA: Diagnosis not present

## 2017-09-07 DIAGNOSIS — R0789 Other chest pain: Secondary | ICD-10-CM | POA: Diagnosis not present

## 2017-09-07 DIAGNOSIS — Y939 Activity, unspecified: Secondary | ICD-10-CM | POA: Diagnosis not present

## 2017-09-07 LAB — CBC
HEMATOCRIT: 46.4 % (ref 40.0–52.0)
HEMOGLOBIN: 16.2 g/dL (ref 13.0–18.0)
MCH: 29.1 pg (ref 26.0–34.0)
MCHC: 34.9 g/dL (ref 32.0–36.0)
MCV: 83.2 fL (ref 80.0–100.0)
Platelets: 243 10*3/uL (ref 150–440)
RBC: 5.58 MIL/uL (ref 4.40–5.90)
RDW: 14.1 % (ref 11.5–14.5)
WBC: 8.5 10*3/uL (ref 3.8–10.6)

## 2017-09-07 LAB — BASIC METABOLIC PANEL
ANION GAP: 10 (ref 5–15)
BUN: 13 mg/dL (ref 6–20)
CALCIUM: 9.7 mg/dL (ref 8.9–10.3)
CO2: 25 mmol/L (ref 22–32)
Chloride: 104 mmol/L (ref 101–111)
Creatinine, Ser: 0.92 mg/dL (ref 0.61–1.24)
GFR calc non Af Amer: >60 mL/min (ref >60)
GLUCOSE: 97 mg/dL (ref 65–99)
POTASSIUM: 4.3 mmol/L (ref 3.5–5.1)
Sodium: 139 mmol/L (ref 135–145)

## 2017-09-07 LAB — TROPONIN I

## 2017-09-07 MED ORDER — HYDROMORPHONE HCL 1 MG/ML IJ SOLN
0.5000 mg | Freq: Once | INTRAMUSCULAR | Status: AC
  Administered 2017-09-07: 0.5 mg via INTRAMUSCULAR
  Filled 2017-09-07: qty 1

## 2017-09-07 MED ORDER — CYCLOBENZAPRINE HCL 5 MG PO TABS: 5.0000 mg | ORAL_TABLET | Freq: Every day | ORAL | 0 refills | Status: DC

## 2017-09-07 MED ORDER — TRAMADOL HCL 50 MG PO TABS: 50.0000 mg | ORAL_TABLET | Freq: Two times a day (BID) | ORAL | 0 refills | Status: DC | PRN

## 2017-09-07 NOTE — ED Triage Notes (Signed)
Pt reports that he went to move around the toy box and shop vac last night. He denies any dizziness just got tangled up. He is complaining of left sided rib, hurts when he takes a deep breath. He is able to ambulate with slow gait. States that he braced himself with right hand and it is hurting him. No deformity seen.

## 2017-09-07 NOTE — ED Notes (Signed)
See triage note  States he developed pain to left rib area last pm  States pain started after moving something  Increased pain with movement

## 2017-09-07 NOTE — ED Provider Notes (Signed)
[facility] Emergency Department Provider Note   ____________________________________________   First MD Initiated Contact with Patient 09/07/17 1147     (approximate)  I have reviewed the triage vital signs and the nursing notes.   HISTORY  Chief Complaint Fall    HPI Justin Warren is a 72 y.o. male patient right and left chest wall pain secondary to tripping and falling last night.  Patient states pain is mostly on the left side of his ribs.  Pain increased with deep breathing.  Rates pain as 8/10.  Described pain is "achy".  Has taken anti-inflammatory medication with no noticeable relief.  History reviewed. No pertinent past medical history.  Patient Active Problem List   Diagnosis Date Noted  . Foot drop, bilateral 05/02/2017  . 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 Hx of decompressive lumbar laminectomy 04/09/2016  . Anxiety 03/07/2015  . Clinical depression 03/07/2015  . Difficulty hearing 03/07/2015  . Hypercholesteremia 03/07/2015  . 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 Acid reflux 06/26/2009  . External hemorrhoids without complication 80/88/1103  . 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 APPENDECTOMY  1994  . CARPAL TUNNEL RELEASE  2012  . LUMBAR LAMINECTOMIES  1976 & 1978   Dr. [name]  . SIGMOIDOSCOPY  2010    Prior to Admission medications   Medication Sig Start Date End Date Taking? Authorizing Provider  Aspirin 81 MG EC tablet Take 81 mg by mouth daily.     [provider]  busPIRone (BUSPAR) 15 MG tablet Take 1 tablet (15 mg total) 2 (two) times daily by mouth. 02/04/17   Chrismon, Vickki Muff, PA  cyclobenzaprine (FLEXERIL) 5 MG tablet Take 1 tablet (5 mg total) by mouth at bedtime. 09/07/17   Sable Feil, PA-C  FLUoxetine (PROZAC) 20 MG capsule Take 1 capsule (20 mg total) daily by  mouth. 02/04/17   Chrismon, Vickki Muff, PA  HYDROcodone-homatropine (HYCODAN) 5-1.5 MG/5ML syrup Take 5 mLs by mouth every 8 (eight) hours as needed for cough. 06/06/17   Chrismon, Vickki Muff, PA  ranitidine (ZANTAC) 150 MG tablet TAKE 1 TABLET (150 MG TOTAL) TWICE DAILY 02/04/17   Chrismon, Vickki Muff, PA  simvastatin (ZOCOR) 20 MG tablet Take 1 tablet (20 mg total) daily by mouth. 02/04/17   Chrismon, Vickki Muff, PA  traMADol (ULTRAM) 50 MG tablet Take 1 tablet (50 mg total) by mouth every 12 (twelve) hours as needed. 09/07/17   Sable Feil, PA-C    Allergies Amoxicillin-pot clavulanate  Family History  Problem Relation Age of Onset  . Dementia Mother   . GER disease Mother   . Hypertension Father   . Arrhythmia Sister   . Heart murmur Sister     Social History Social History   Tobacco Use  . Smoking status: Former Smoker    Packs/day: 2.00    Years: 30.00    Pack years: 60.00    Types: Cigarettes  . Smokeless tobacco: Never Used  . Tobacco comment: quit in 1991-1992  Substance Use Topics  . Alcohol use: Yes    Alcohol/week: 0.0 oz    Frequency: Never    Comment: wine rare  . Drug use: No    Review of Systems Constitutional: No fever/chills Eyes: No visual changes. ENT: No sore throat. Cardiovascular: Denies chest pain. Respiratory: Denies shortness of breath. Gastrointestinal: No abdominal pain.  No  nausea, no vomiting.  No diarrhea.  No constipation. Genitourinary: Negative for dysuria. Musculoskeletal: Bilateral chest wall pain Skin: Negative for rash. Neurological: Negative for headaches, focal weakness or numbness. Endocrine:Hyperlipidemia Hematological/Lymphatic: Allergic/Immunilogical: Augmentin ____________________________________________   PHYSICAL EXAM:  VITAL SIGNS: ED Triage Vitals  Enc Vitals Group     BP 09/07/17 1137 (!) 143/80     Pulse Rate 09/07/17 1137 71     Resp 09/07/17 1137 20     Temp 09/07/17 1137 98.7 F (37.1 C)     Temp Source  09/07/17 1137 Oral     SpO2 09/07/17 1137 97 %     Weight 09/07/17 1138 196 lb (88.9 kg)     Height 09/07/17 1138 5' 9"  (1.753 m)     Head Circumference --      Peak Flow --      Pain Score 09/07/17 1138 8     Pain Loc --      Pain Edu? --      Excl. in [place]? --     Constitutional: Alert and oriented.  Moderate distress.   Neck: No stridor.  No cervical spine tenderness to palpation. Cardiovascular: Normal rate, regular rhythm. Grossly normal heart sounds.  Good peripheral circulation. Respiratory: Normal respiratory effort.  No retractions. Lungs CTAB. Gastrointestinal: Soft and nontender. No distention. No abdominal bruits. No CVA tenderness. Musculoskeletal: No obvious chest wall deformity.  Patient has moderate guarding palpation fifth and sixth left thoraic vertebrae. Neurologic:  Normal speech and language. No gross focal neurologic deficits are appreciated. No gait instability. Skin:  Skin is warm, dry and intact. No rash noted.  No abrasion or ecchymosis. Psychiatric: Mood and affect are normal. Speech and behavior are normal.  ____________________________________________   LABS (all labs ordered are listed, but only abnormal results are displayed)  Labs Reviewed  BASIC METABOLIC PANEL  CBC  TROPONIN I   ____________________________________________  EKG EKG read by Dr. Alfred Levins with no acute findings.  ____________________________________________  RADIOLOGY  ED MD interpretation:    Official radiology report(s): Dg Ribs Unilateral W/chest Left  Result Date: 09/07/2017 CLINICAL DATA:  Recent fall with left-sided chest pain, initial encounter EXAM: LEFT RIBS AND CHEST - 3+ VIEW COMPARISON:  12/13/2016 FINDINGS: Cardiac shadow is within normal limits. The lungs are well aerated bilaterally. No pneumothorax is seen. No acute rib fracture is noted. IMPRESSION: No acute abnormality noted. Electronically Signed   By: Inez Catalina M.D.   On: 09/07/2017 12:29     ____________________________________________   PROCEDURES  Procedure(s) performed: None  Procedures  Critical Care performed: No  ____________________________________________   INITIAL IMPRESSION / ASSESSMENT AND PLAN / ED COURSE  As part of my medical decision making, I reviewed the following data within the electronic MEDICAL RECORD NUMBER    Chest wall pain secondary to contusion from a fall.  Patient was given 0.5 mg of Dilaudid and taken to x-ray.  Patient complained of increased chest pain radiating up to his neck after returning from chest x-ray.  EKG and cardiac enzymes unremarkable.  Discussed x-ray and lab results with patient.  Patient given discharge care instruction.  Patient advised follow-up PCP if no improvement in 3 to 5 days.     ____________________________________________   FINAL CLINICAL IMPRESSION(S) / ED DIAGNOSES  Final diagnoses:  Chest wall contusion, left, initial encounter     ED Discharge Orders        Ordered    traMADol (ULTRAM) 50 MG tablet  Every 12 hours PRN  09/07/17 1344    cyclobenzaprine (FLEXERIL) 5 MG tablet  Daily at bedtime     09/07/17 1344       Note:  This document was prepared using Dragon voice recognition software and may include unintentional dictation errors.    Sable Feil, PA-C 09/07/17 1348    Nena Polio, MD 09/07/17 [phone]

## 2017-09-07 NOTE — ED Notes (Addendum)
Back from x-ray  States he developed chest pain which moving into neck. Skin is warm and dry  Provider aware

## 2017-10-03 ENCOUNTER — Ambulatory Visit (INDEPENDENT_AMBULATORY_CARE_PROVIDER_SITE_OTHER): Payer: Medicare HMO | Admitting: Family Medicine

## 2017-10-03 ENCOUNTER — Encounter: Payer: Self-pay | Admitting: Family Medicine

## 2017-10-03 VITALS — BP 142/78 | HR 68 | Temp 97.9°F | Wt 198.6 lb

## 2017-10-03 DIAGNOSIS — S20212D Contusion of left front wall of thorax, subsequent encounter: Secondary | ICD-10-CM

## 2017-10-03 NOTE — Progress Notes (Signed)
Patient: Justin Warren Male    DOB: 19-Sep-1945   72 y.o.   MRN: [id number] Visit Date: 10/03/2017  Today's Provider: Vernie Murders, PA   Chief Complaint  Patient presents with  . Fall   Subjective:    Fall  Incident onset: 09/06/17. Fall occurred: while walking in garage. Impact surface: corner of toy box and vacuum cleaner. Point of impact: left side of chest area hit corner of toy box. The symptoms are aggravated by movement, extension, pressure on injury, flexion and rotation. Treatments tried: Patient was seen at ER the following day after the fall on 09/07/17. He was diagnosed with a left side chest wall contusion. He was prescribed Tramadol and Flexeril. The treatment provided no relief.   History reviewed. No pertinent past medical history. Patient Active Problem List   Diagnosis Date Noted  . Foot drop, bilateral 05/02/2017  . 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 Hx of decompressive lumbar laminectomy 04/09/2016  . Anxiety 03/07/2015  . Clinical depression 03/07/2015  . Difficulty hearing 03/07/2015  . Hypercholesteremia 03/07/2015  . 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 Acid reflux 06/26/2009  . External hemorrhoids without complication 48/54/6270  . 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 APPENDECTOMY  1994  . CARPAL TUNNEL RELEASE  2012  . LUMBAR LAMINECTOMIES  1976 & 1978   Dr. [name]  . SIGMOIDOSCOPY  2010   Family History  Problem Relation Age of Onset  . Dementia Mother   . GER disease Mother   . Hypertension Father   . Arrhythmia Sister   . Heart murmur Sister    Allergies  Allergen Reactions  . Amoxicillin-Pot Clavulanate     Other reaction(s): Other (See Comments)    Current Outpatient Medications:  .  Aspirin 81 MG EC tablet, Take 81 mg by mouth daily. , Disp: , Rfl:  .  busPIRone (BUSPAR) 15 MG tablet, Take 1  tablet (15 mg total) 2 (two) times daily by mouth., Disp: 180 tablet, Rfl: 3 .  FLUoxetine (PROZAC) 20 MG capsule, Take 1 capsule (20 mg total) daily by mouth., Disp: 90 capsule, Rfl: 3 .  ranitidine (ZANTAC) 150 MG tablet, TAKE 1 TABLET (150 MG TOTAL) TWICE DAILY, Disp: 180 tablet, Rfl: 3 .  simvastatin (ZOCOR) 20 MG tablet, Take 1 tablet (20 mg total) daily by mouth., Disp: 90 tablet, Rfl: 3 .  traMADol (ULTRAM) 50 MG tablet, Take 1 tablet (50 mg total) by mouth every 12 (twelve) hours as needed., Disp: 12 tablet, Rfl: 0   Review of Systems  Constitutional: Negative.   Respiratory: Negative.   Cardiovascular: Negative.        Chest wall pain     Social History   Tobacco Use  . Smoking status: Former Smoker    Packs/day: 2.00    Years: 30.00    Pack years: 60.00    Types: Cigarettes  . Smokeless tobacco: Never Used  . Tobacco comment: quit in 1991-1992  Substance Use Topics  . Alcohol use: Yes    Alcohol/week: 0.0 oz    Frequency: Never    Comment: wine rare   Objective:   BP (!) 142/78 (BP Location: Right Arm, Patient Position: Sitting, Cuff Size: Normal)   Pulse 68   Temp 97.9 F (36.6 C) (Oral)   Wt 198 lb 9.6 oz (90.1 kg)   SpO2 99%   BMI 29.33  kg/m   Physical Exam  Constitutional: He is oriented to person, place, and time. He appears well-developed and well-nourished. No distress.  HENT:  Head: Normocephalic and atraumatic.  Right Ear: Hearing normal.  Left Ear: Hearing normal.  Nose: Nose normal.  Eyes: Conjunctivae and lids are normal. Right eye exhibits no discharge. Left eye exhibits no discharge. No scleral icterus.  Cardiovascular: Normal rate and normal heart sounds.  Pulmonary/Chest: Effort normal and breath sounds normal. No respiratory distress. He exhibits tenderness.  Abdominal: Soft.  Musculoskeletal: Normal range of motion.  Neurological: He is alert and oriented to person, place, and time.  Skin: Skin is intact. No lesion and no rash noted.    Psychiatric: He has a normal mood and affect. His speech is normal and behavior is normal. Thought content normal.      Assessment & Plan:     1. Chest wall contusion, left, subsequent encounter Tripped and fell onto a wooden chest on 09-07-17. Went to the ER and x-rays were negative for fracture. Treated with NSAID, Tramadol and Flexeril. Still has some soreness and sharp pain with the "right" movement for only a second. Feeling improved without dyspnea or significant bruising remaining. No hematoma or breast mass. May continue to use Real Time Pain Relief Cream. Recheck prn. Lungs are clear and no palpitations.        Vernie Murders, PA  [facility]

## 2017-10-14 ENCOUNTER — Telehealth: Payer: Self-pay | Admitting: Family Medicine

## 2017-10-14 NOTE — Telephone Encounter (Signed)
Patient advised.

## 2017-10-14 NOTE — Telephone Encounter (Signed)
Please advise 

## 2017-10-14 NOTE — Telephone Encounter (Signed)
Can take one capsule every other day for two weeks, then one every third day for two weeks, then stop.

## 2017-10-14 NOTE — Telephone Encounter (Signed)
Pt has been sweating a lot.  This has been going on for months even before all the heat.  He thinks it has something to do with Fluoxetine.  He said he purposely didn't take it one day and noticed that he did not sweat as bad.  He is wanting to know if he can be tapered off this medication  CB# [phone]  Thanks Con Memos

## 2017-10-21 DIAGNOSIS — M1712 Unilateral primary osteoarthritis, left knee: Secondary | ICD-10-CM | POA: Diagnosis not present

## 2017-12-05 DIAGNOSIS — L814 Other melanin hyperpigmentation: Secondary | ICD-10-CM | POA: Diagnosis not present

## 2017-12-05 DIAGNOSIS — L570 Actinic keratosis: Secondary | ICD-10-CM | POA: Diagnosis not present

## 2017-12-05 DIAGNOSIS — X32XXXA Exposure to sunlight, initial encounter: Secondary | ICD-10-CM | POA: Diagnosis not present

## 2017-12-05 DIAGNOSIS — Z85828 Personal history of other malignant neoplasm of skin: Secondary | ICD-10-CM | POA: Diagnosis not present

## 2017-12-05 DIAGNOSIS — L821 Other seborrheic keratosis: Secondary | ICD-10-CM | POA: Diagnosis not present

## 2017-12-05 DIAGNOSIS — Z08 Encounter for follow-up examination after completed treatment for malignant neoplasm: Secondary | ICD-10-CM | POA: Diagnosis not present

## 2017-12-07 ENCOUNTER — Other Ambulatory Visit: Payer: Self-pay | Admitting: Family Medicine

## 2017-12-07 DIAGNOSIS — F419 Anxiety disorder, unspecified: Secondary | ICD-10-CM

## 2018-02-14 DIAGNOSIS — M1711 Unilateral primary osteoarthritis, right knee: Secondary | ICD-10-CM | POA: Diagnosis not present

## 2018-02-14 DIAGNOSIS — M170 Bilateral primary osteoarthritis of knee: Secondary | ICD-10-CM | POA: Diagnosis not present

## 2018-02-28 DIAGNOSIS — H903 Sensorineural hearing loss, bilateral: Secondary | ICD-10-CM | POA: Diagnosis not present

## 2018-03-09 ENCOUNTER — Encounter: Payer: Self-pay | Admitting: Family Medicine

## 2018-03-09 ENCOUNTER — Ambulatory Visit (INDEPENDENT_AMBULATORY_CARE_PROVIDER_SITE_OTHER): Payer: Medicare HMO | Admitting: Family Medicine

## 2018-03-09 VITALS — BP 144/84 | HR 32 | Temp 97.9°F | Resp 16 | Wt 194.0 lb

## 2018-03-09 DIAGNOSIS — R5383 Other fatigue: Secondary | ICD-10-CM

## 2018-03-09 DIAGNOSIS — R001 Bradycardia, unspecified: Secondary | ICD-10-CM | POA: Diagnosis not present

## 2018-03-09 DIAGNOSIS — R0789 Other chest pain: Secondary | ICD-10-CM | POA: Insufficient documentation

## 2018-03-09 DIAGNOSIS — R34 Anuria and oliguria: Secondary | ICD-10-CM | POA: Diagnosis not present

## 2018-03-09 LAB — POCT URINALYSIS DIPSTICK
Appearance: NORMAL
BILIRUBIN UA: NEGATIVE
CLARITY UA: NORMAL
Glucose, UA: NEGATIVE
KETONES UA: NEGATIVE
LEUKOCYTES UA: NEGATIVE
Nitrite, UA: NEGATIVE
Odor: NORMAL
PH UA: 6 (ref 5.0–8.0)
Protein, UA: NEGATIVE
Spec Grav, UA: 1.015 (ref 1.010–1.025)
Urobilinogen, UA: 0.2 E.U./dL

## 2018-03-09 MED ORDER — SULFAMETHOXAZOLE-TRIMETHOPRIM 800-160 MG PO TABS: 1.0000 | ORAL_TABLET | Freq: Two times a day (BID) | ORAL | 0 refills | Status: DC

## 2018-03-09 NOTE — Progress Notes (Signed)
Patient: Justin Warren Male    DOB: Feb 15, 1946   72 y.o.   MRN: [id number] Visit Date: 03/09/2018  Today's Provider: Vernie Murders, PA   Chief Complaint  Patient presents with  . Urinary Retention   Subjective:    HPI  Patient states for the past 2-3 weeks he has had decreased urine. When he tries to urinate, it takes a few seconds for urine flow to begin. Patient states when urine flow starts it will stop and start again. Patient also states he has been having low back pain for 2-3 weeks. Patient has no symptoms of abdominal pain, no pain upon urination and has not noticed any blood in his urine. Patient also states he has been off-balance and fatigued lately.  History reviewed. No pertinent past medical history. Patient Active Problem List   Diagnosis Date Noted  . Foot drop, bilateral 05/02/2017  . 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 Hx of decompressive lumbar laminectomy 04/09/2016  . Anxiety 03/07/2015  . Clinical depression 03/07/2015  . Difficulty hearing 03/07/2015  . Hypercholesteremia 03/07/2015  . 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 Acid reflux 06/26/2009  . External hemorrhoids without complication 76/16/0737  . 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 Past Surgical History:  Procedure Laterality Date  . APPENDECTOMY  1994  . CARPAL TUNNEL RELEASE  2012  . LUMBAR LAMINECTOMIES  1976 & 1978   Dr. [name]  . SIGMOIDOSCOPY  2010   Family History  Problem Relation Age of Onset  . Dementia Mother   . GER disease Mother   . Hypertension Father   . Arrhythmia Sister   . Heart murmur Sister    Allergies  Allergen Reactions  . Amoxicillin-Pot Clavulanate     Other reaction(s): Other (See Comments)    Current Outpatient Medications:  .  Aspirin 81 MG EC tablet, Take 81 mg by mouth daily. , Disp: , Rfl:  .  busPIRone (BUSPAR) 15 MG tablet, Take 1 tablet (15 mg total)  2 (two) times daily by mouth., Disp: 180 tablet, Rfl: 3 .  FLUoxetine (PROZAC) 20 MG capsule, TAKE 1 CAPSULE EVERY DAY, Disp: 90 capsule, Rfl: 3 .  meloxicam (MOBIC) 15 MG tablet, Mobic 15 mg tablet  Take 1 tablet(s) every day by oral route., Disp: , Rfl:  .  ranitidine (ZANTAC) 150 MG tablet, TAKE 1 TABLET TWICE DAILY, Disp: 180 tablet, Rfl: 3 .  simvastatin (ZOCOR) 20 MG tablet, Take 1 tablet (20 mg total) daily by mouth., Disp: 90 tablet, Rfl: 3 .  traMADol (ULTRAM) 50 MG tablet, Take 1 tablet (50 mg total) by mouth every 12 (twelve) hours as needed. (Patient not taking: Reported on 03/09/2018), Disp: 12 tablet, Rfl: 0  Review of Systems  Constitutional: Positive for fatigue. Negative for appetite change, chills and fever.  Respiratory: Negative for chest tightness, shortness of breath and wheezing.   Cardiovascular: Negative for chest pain and palpitations.  Gastrointestinal: Negative for abdominal pain, nausea and vomiting.  Genitourinary: Positive for difficulty urinating.  Musculoskeletal: Positive for back pain.   Social History   Tobacco Use  . Smoking status: Former Smoker    Packs/day: 2.00    Years: 30.00    Pack years: 60.00    Types: Cigarettes  . Smokeless tobacco: Never Used  . Tobacco comment: quit in 1991-1992  Substance Use Topics  . Alcohol use: Yes    Alcohol/week: 0.0 standard  drinks    Frequency: Never    Comment: wine rare   Objective:   BP (!) 144/84 (BP Location: Right Arm, Patient Position: Sitting, Cuff Size: Large)   Pulse (!) 32   Temp 97.9 F (36.6 C) (Oral)   Resp 16   Wt 194 lb (88 kg)   SpO2 99%   BMI 28.65 kg/m  Vitals:   03/09/18 0814  BP: (!) 144/84  Pulse: (!) 32  Resp: 16  Temp: 97.9 F (36.6 C)  TempSrc: Oral  SpO2: 99%  Weight: 194 lb (88 kg)   Physical Exam Constitutional:      General: He is not in acute distress.    Appearance: He is well-developed.  HENT:     Head: Normocephalic and atraumatic.     Right Ear:  Hearing, tympanic membrane and ear canal normal.     Left Ear: Hearing, tympanic membrane and ear canal normal.     Nose: Nose normal.     Mouth/Throat:     Mouth: Mucous membranes are moist.     Pharynx: Oropharynx is clear.  Eyes:     General: Lids are normal. No scleral icterus.       Right eye: No discharge.        Left eye: No discharge.     Conjunctiva/sclera: Conjunctivae normal.  Cardiovascular:     Rate and Rhythm: Bradycardia present. Rhythm irregular.  Pulmonary:     Effort: Pulmonary effort is normal. No respiratory distress.     Breath sounds: Normal breath sounds.  Abdominal:     General: Abdomen is flat. Bowel sounds are normal.  Musculoskeletal: Normal range of motion.  Skin:    Findings: No lesion or rash.  Neurological:     Mental Status: He is alert and oriented to person, place, and time.     Comments: Bilateral foot drop - wearing splints bilaterally.  Psychiatric:        Speech: Speech normal.        Behavior: Behavior normal.        Thought Content: Thought content normal.      Assessment & Plan:     1. Decreased urine output Onset the past 2-3 weeks. No burning or hematuria. Some low back discomfort with history of lumbar laminectomy. Decreased urine flow but DRE negative for pain or nodules. Suspect prostatitis. Will treat with Septra-DS and get urine culture. May need Tamsulosin. - POCT urinalysis dipstick - Urine Culture  2. Bradycardia Pulse low with heart rate 58 on EKG. No dyspnea or chest pains. No pedal edema. Has had episodes of severe fatigue with exertion. EKG raises question of A. Fib with bradycardia. Schedule cardiology referral as soon as possible. - EKG 12-Lead - Ambulatory referral to Cardiology  3. Fatigue, unspecified type Noticed several episodes during the summer after an hour of heavy physical activity. No chest pains or dizziness. Suspect secondary to bradycardia. 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 - Ambulatory referral to Cardiology        Vernie Murders, [facility]

## 2018-03-11 LAB — URINE CULTURE

## 2018-03-16 ENCOUNTER — Other Ambulatory Visit: Payer: Self-pay | Admitting: *Deleted

## 2018-03-16 ENCOUNTER — Other Ambulatory Visit: Payer: Self-pay | Admitting: Family Medicine

## 2018-03-16 DIAGNOSIS — F419 Anxiety disorder, unspecified: Secondary | ICD-10-CM

## 2018-03-16 MED ORDER — FLUOXETINE HCL 20 MG PO CAPS: 20.0000 mg | ORAL_CAPSULE | Freq: Every day | ORAL | 3 refills | Status: DC

## 2018-03-16 MED ORDER — SIMVASTATIN 20 MG PO TABS: 20.0000 mg | ORAL_TABLET | Freq: Every day | ORAL | 3 refills | Status: DC

## 2018-03-16 MED ORDER — RANITIDINE HCL 150 MG PO TABS: ORAL_TABLET | ORAL | 3 refills | Status: DC

## 2018-03-17 DIAGNOSIS — M47812 Spondylosis without myelopathy or radiculopathy, cervical region: Secondary | ICD-10-CM | POA: Diagnosis not present

## 2018-03-17 DIAGNOSIS — M4306 Spondylolysis, lumbar region: Secondary | ICD-10-CM | POA: Diagnosis not present

## 2018-03-31 DIAGNOSIS — R0789 Other chest pain: Secondary | ICD-10-CM | POA: Diagnosis not present

## 2018-04-06 DIAGNOSIS — E785 Hyperlipidemia, unspecified: Secondary | ICD-10-CM | POA: Diagnosis not present

## 2018-04-06 DIAGNOSIS — R0789 Other chest pain: Secondary | ICD-10-CM | POA: Diagnosis not present

## 2018-04-06 DIAGNOSIS — I4589 Other specified conduction disorders: Secondary | ICD-10-CM | POA: Diagnosis not present

## 2018-04-06 DIAGNOSIS — R001 Bradycardia, unspecified: Secondary | ICD-10-CM | POA: Diagnosis not present

## 2018-04-10 ENCOUNTER — Telehealth: Payer: Self-pay

## 2018-04-10 NOTE — Telephone Encounter (Signed)
All medications were refilled for a year with [facility] on 03-16-18. Usually all he has to do is let [employer] know he is running out. If [employer] is having problems, he should ask them to contact us directly.

## 2018-04-10 NOTE — Telephone Encounter (Signed)
Patient called to cancel his appt on 04/21/2018. Patient reports that he has surgery with cardio on 04/20/2018. Patient reports that you need to call [employer] to renew his medications. Patient reports that you have been doing this every 90 days.

## 2018-04-10 NOTE — Telephone Encounter (Signed)
Please Reciew

## 2018-04-10 NOTE — Telephone Encounter (Signed)
Patient advised as directed below. 

## 2018-04-14 ENCOUNTER — Other Ambulatory Visit: Payer: Self-pay

## 2018-04-14 ENCOUNTER — Encounter
Admission: RE | Admit: 2018-04-14 | Discharge: 2018-04-14 | Disposition: A | Discharge status: Home / Self Care | Payer: Medicare HMO | Source: Ambulatory Visit | Attending: Cardiology | Admitting: Cardiology

## 2018-04-14 ENCOUNTER — Ambulatory Visit
Admission: RE | Admit: 2018-04-14 | Discharge: 2018-04-14 | Disposition: A | Discharge status: Home / Self Care | Payer: Medicare HMO | Source: Ambulatory Visit | Attending: Cardiology | Admitting: Cardiology

## 2018-04-14 DIAGNOSIS — Z01810 Encounter for preprocedural cardiovascular examination: Secondary | ICD-10-CM | POA: Diagnosis not present

## 2018-04-14 DIAGNOSIS — Z01818 Encounter for other preprocedural examination: Secondary | ICD-10-CM | POA: Insufficient documentation

## 2018-04-14 DIAGNOSIS — I10 Essential (primary) hypertension: Secondary | ICD-10-CM | POA: Diagnosis not present

## 2018-04-14 DIAGNOSIS — R918 Other nonspecific abnormal finding of lung field: Secondary | ICD-10-CM | POA: Diagnosis not present

## 2018-04-14 HISTORY — DX: Gastro-esophageal reflux disease without esophagitis: K21.9

## 2018-04-14 LAB — BASIC METABOLIC PANEL
Anion gap: 6 (ref 5–15)
BUN: 13 mg/dL (ref 8–23)
CO2: 30 mmol/L (ref 22–32)
CREATININE: 0.83 mg/dL (ref 0.61–1.24)
Calcium: 9.8 mg/dL (ref 8.9–10.3)
Chloride: 105 mmol/L (ref 98–111)
GFR calc Af Amer: >60 mL/min (ref >60)
GFR calc non Af Amer: >60 mL/min (ref >60)
GLUCOSE: 93 mg/dL (ref 70–99)
Potassium: 4.4 mmol/L (ref 3.5–5.1)
Sodium: 141 mmol/L (ref 135–145)

## 2018-04-14 LAB — PROTIME-INR
INR: 0.95
PROTHROMBIN TIME: 12.6 s (ref 11.4–15.2)

## 2018-04-14 LAB — CBC
HEMATOCRIT: 46.4 % (ref 39.0–52.0)
HEMOGLOBIN: 15.6 g/dL (ref 13.0–17.0)
MCH: 28.6 pg (ref 26.0–34.0)
MCHC: 33.6 g/dL (ref 30.0–36.0)
MCV: 85 fL (ref 80.0–100.0)
Platelets: 256 10*3/uL (ref 150–400)
RBC: 5.46 MIL/uL (ref 4.22–5.81)
RDW: 14.2 % (ref 11.5–15.5)
WBC: 5.9 10*3/uL (ref 4.0–10.5)
nRBC: 0 % (ref 0.0–0.2)

## 2018-04-14 LAB — SURGICAL PCR SCREEN
MRSA, PCR: NEGATIVE
Staphylococcus aureus: POSITIVE — AB

## 2018-04-14 LAB — APTT: aPTT: 29 seconds (ref 24–36)

## 2018-04-14 NOTE — Patient Instructions (Addendum)
  Your procedure is scheduled on: Thursday April 20, 2018 Report to Same Day Surgery 2nd floor [facility] Entrance-take elevator on left to 2nd floor.  Check in with surgery information desk.) To find out your arrival time, call [phone] 1:00-3:00 PM on Wednesday April 19, 2018  Remember: Instructions that are not followed completely may result in serious medical risk, up to and including death, or upon the discretion of your surgeon and anesthesiologist your surgery may need to be rescheduled.    __x__ 1. Do not eat food (including mints, candies, chewing gum) after midnight the night before your procedure. You may drink clear liquids up to 2 hours before you are scheduled to arrive at the hospital for your procedure.  Do not drink anything within 2 hours of your scheduled arrival to the hospital.  Approved clear liquids:  --Water or Apple juice without pulp  --Clear carbohydrate beverage such as Gatorade or Powerade  --Black Coffee or Clear Tea (No milk, no creamers, do not add anything to the coffee or tea)    __x__ 2. No Alcohol for 24 hours before or after surgery.   __x__ 3. No Smoking or e-cigarettes for 24 hours before surgery.  Do not use any chewable tobacco products for at least 6 hours before surgery.   __x__ 4. Notify your doctor if there is any change in your medical condition (cold, fever, infections).   __x__ 5. On the morning of surgery brush your teeth with toothpaste and water.  You may rinse your mouth with mouthwash if you wish.  Do not swallow any toothpaste or mouthwash.  Please read over the following fact sheets that you were given:   [facility] Preparing for Surgery and/or MRSA Information    __x__ Use CHG Soap or Sage wipes as directed on instruction sheet    Do not wear jewelry on the day of surgery.  Do not wear lotions, powders, deodorant, or perfumes.   Do not shave below the face/neck 48 hours prior to surgery.   Do not bring  valuables to the hospital.    [facility] is not responsible for any belongings or valuables.               Contacts, dentures or bridgework may not be worn into surgery.  Leave your suitcase in the car. After surgery it may be brought to your room.  For patients admitted to the hospital, discharge time is determined by your treatment team.  For patients discharged on the day of surgery, you will NOT be permitted to drive yourself home.  You must have a responsible adult with you for 24 hours after surgery.  __x__ Take these medicines on the morning of surgery with a SMALL SIP OF WATER:  1. Buspirone (Buspar)  2. Fluoxetine (Prozac)  3. Gabapentin (Neurontin)  4. Ranitidine (Zantac)  __x__ Follow recommendations from Cardiologist, Pulmonologist or PCP regarding stopping Aspirin, and other blood thinners such as Coumadin, Plavix, Eliquis, Effient, Pradaxa, and Pletal.  __x__ TODAY: Stop Anti-inflammatories such as Advil, Ibuprofen, Motrin, Aleve, Naproxen, Naprosyn, BC/Goodies powders or aspirin products. You may continue to take Tylenol and Celebrex.   __x__ TODAY: Stop supplements until after surgery. You may continue to take Vitamin D, Vitamin B, and multivitamin.

## 2018-04-14 NOTE — Progress Notes (Signed)
PCR results (MRSA negative, SA positive) faxed to Dr. Sharlet Salina office.  Requesting clarification if pt needs new abx for DOS ordered.  Awaiting response. Per Nira Conn (nurse at Dr. Sharlet Salina office) EKG and CXR results not new.

## 2018-04-20 ENCOUNTER — Observation Stay
Admission: RE | Admit: 2018-04-20 | Discharge: 2018-04-21 | Disposition: A | Discharge status: Home / Self Care | Payer: Medicare HMO | Attending: Cardiology | Admitting: Cardiology

## 2018-04-20 ENCOUNTER — Observation Stay: Payer: Medicare HMO

## 2018-04-20 ENCOUNTER — Other Ambulatory Visit: Payer: Self-pay

## 2018-04-20 ENCOUNTER — Ambulatory Visit: Payer: Medicare HMO | Admitting: Certified Registered"

## 2018-04-20 ENCOUNTER — Encounter
Admission: RE | Disposition: A | Discharge status: Home / Self Care | Payer: Self-pay | Source: Home / Self Care | Attending: Cardiology

## 2018-04-20 ENCOUNTER — Ambulatory Visit: Payer: Medicare HMO

## 2018-04-20 DIAGNOSIS — I442 Atrioventricular block, complete: Secondary | ICD-10-CM | POA: Diagnosis present

## 2018-04-20 DIAGNOSIS — F419 Anxiety disorder, unspecified: Secondary | ICD-10-CM | POA: Diagnosis not present

## 2018-04-20 DIAGNOSIS — Z45018 Encounter for adjustment and management of other part of cardiac pacemaker: Secondary | ICD-10-CM | POA: Diagnosis not present

## 2018-04-20 DIAGNOSIS — I10 Essential (primary) hypertension: Secondary | ICD-10-CM | POA: Insufficient documentation

## 2018-04-20 DIAGNOSIS — Z87891 Personal history of nicotine dependence: Secondary | ICD-10-CM | POA: Insufficient documentation

## 2018-04-20 DIAGNOSIS — E785 Hyperlipidemia, unspecified: Secondary | ICD-10-CM | POA: Insufficient documentation

## 2018-04-20 DIAGNOSIS — Z820 Family history of epilepsy and other diseases of the nervous system: Secondary | ICD-10-CM | POA: Diagnosis not present

## 2018-04-20 DIAGNOSIS — F418 Other specified anxiety disorders: Secondary | ICD-10-CM | POA: Diagnosis not present

## 2018-04-20 DIAGNOSIS — Z79899 Other long term (current) drug therapy: Secondary | ICD-10-CM | POA: Diagnosis not present

## 2018-04-20 DIAGNOSIS — Z7982 Long term (current) use of aspirin: Secondary | ICD-10-CM | POA: Insufficient documentation

## 2018-04-20 DIAGNOSIS — K219 Gastro-esophageal reflux disease without esophagitis: Secondary | ICD-10-CM | POA: Diagnosis not present

## 2018-04-20 DIAGNOSIS — I441 Atrioventricular block, second degree: Secondary | ICD-10-CM | POA: Insufficient documentation

## 2018-04-20 DIAGNOSIS — Z950 Presence of cardiac pacemaker: Secondary | ICD-10-CM

## 2018-04-20 DIAGNOSIS — E7800 Pure hypercholesterolemia, unspecified: Secondary | ICD-10-CM | POA: Diagnosis not present

## 2018-04-20 DIAGNOSIS — R001 Bradycardia, unspecified: Principal | ICD-10-CM | POA: Insufficient documentation

## 2018-04-20 DIAGNOSIS — F329 Major depressive disorder, single episode, unspecified: Secondary | ICD-10-CM | POA: Insufficient documentation

## 2018-04-20 HISTORY — PX: PACEMAKER INSERTION: SHX728

## 2018-04-20 SURGERY — INSERTION, CARDIAC PACEMAKER
Anesthesia: General | Laterality: Left

## 2018-04-20 MED ORDER — SODIUM CHLORIDE 0.9 % IV SOLN
Freq: Once | INTRAVENOUS | Status: DC
  Filled 2018-04-20: qty 2

## 2018-04-20 MED ORDER — LACTATED RINGERS IV SOLN
INTRAVENOUS | Status: DC
  Administered 2018-04-20: 12:00:00 via INTRAVENOUS

## 2018-04-20 MED ORDER — ACETAMINOPHEN 325 MG PO TABS
325.0000 mg | ORAL_TABLET | ORAL | Status: DC | PRN
  Administered 2018-04-20: 650 mg via ORAL
  Filled 2018-04-20: qty 2

## 2018-04-20 MED ORDER — FENTANYL CITRATE (PF) 100 MCG/2ML IJ SOLN
INTRAMUSCULAR | Status: AC
  Filled 2018-04-20: qty 2

## 2018-04-20 MED ORDER — FENTANYL CITRATE (PF) 100 MCG/2ML IJ SOLN
INTRAMUSCULAR | Status: DC | PRN
  Administered 2018-04-20 (×3): 25 ug via INTRAVENOUS

## 2018-04-20 MED ORDER — SIMVASTATIN 20 MG PO TABS
20.0000 mg | ORAL_TABLET | Freq: Every day | ORAL | Status: DC
  Administered 2018-04-20: 20 mg via ORAL
  Filled 2018-04-20: qty 1

## 2018-04-20 MED ORDER — PROPOFOL 10 MG/ML IV BOLUS
INTRAVENOUS | Status: AC
  Filled 2018-04-20: qty 40

## 2018-04-20 MED ORDER — ONDANSETRON HCL 4 MG/2ML IJ SOLN: 4.0000 mg | Freq: Once | INTRAMUSCULAR | Status: DC | PRN

## 2018-04-20 MED ORDER — CEFAZOLIN SODIUM-DEXTROSE 1-4 GM/50ML-% IV SOLN
INTRAVENOUS | Status: AC
  Filled 2018-04-20: qty 50

## 2018-04-20 MED ORDER — FLUOXETINE HCL 20 MG PO CAPS
20.0000 mg | ORAL_CAPSULE | Freq: Every day | ORAL | Status: DC
  Administered 2018-04-21: 20 mg via ORAL
  Filled 2018-04-20 (×2): qty 1

## 2018-04-20 MED ORDER — PROPOFOL 500 MG/50ML IV EMUL
INTRAVENOUS | Status: DC | PRN
  Administered 2018-04-20: 125 ug/kg/min via INTRAVENOUS

## 2018-04-20 MED ORDER — LABETALOL HCL 5 MG/ML IV SOLN
INTRAVENOUS | Status: AC
  Administered 2018-04-20: 10 mg
  Filled 2018-04-20: qty 4

## 2018-04-20 MED ORDER — BUSPIRONE HCL 15 MG PO TABS
15.0000 mg | ORAL_TABLET | Freq: Two times a day (BID) | ORAL | Status: DC
  Administered 2018-04-20 – 2018-04-21 (×2): 15 mg via ORAL
  Filled 2018-04-20 (×3): qty 1

## 2018-04-20 MED ORDER — SODIUM CHLORIDE 0.9 % IV SOLN
INTRAVENOUS | Status: DC | PRN
  Administered 2018-04-20: 120 mL

## 2018-04-20 MED ORDER — EPINEPHRINE PF 1 MG/ML IJ SOLN
INTRAMUSCULAR | Status: AC
  Filled 2018-04-20: qty 1

## 2018-04-20 MED ORDER — ONDANSETRON HCL 4 MG/2ML IJ SOLN: 4.0000 mg | Freq: Four times a day (QID) | INTRAMUSCULAR | Status: DC | PRN

## 2018-04-20 MED ORDER — LABETALOL HCL 5 MG/ML IV SOLN: 10.0000 mg | Freq: Once | INTRAVENOUS | Status: DC

## 2018-04-20 MED ORDER — CEFAZOLIN SODIUM-DEXTROSE 1-4 GM/50ML-% IV SOLN
1.0000 g | Freq: Four times a day (QID) | INTRAVENOUS | Status: AC
  Administered 2018-04-20 – 2018-04-21 (×3): 1 g via INTRAVENOUS
  Filled 2018-04-20 (×3): qty 50

## 2018-04-20 MED ORDER — FENTANYL CITRATE (PF) 100 MCG/2ML IJ SOLN
25.0000 ug | INTRAMUSCULAR | Status: DC | PRN
  Administered 2018-04-20: 25 ug via INTRAVENOUS

## 2018-04-20 MED ORDER — PROPOFOL 10 MG/ML IV BOLUS
INTRAVENOUS | Status: DC | PRN
  Administered 2018-04-20: 35 mg via INTRAVENOUS

## 2018-04-20 MED ORDER — CEFAZOLIN SODIUM-DEXTROSE 1-4 GM/50ML-% IV SOLN
INTRAVENOUS | Status: DC | PRN
  Administered 2018-04-20: 1 g via INTRAVENOUS

## 2018-04-20 MED ORDER — LIDOCAINE 1 % OPTIME INJ - NO CHARGE
INTRAMUSCULAR | Status: DC | PRN
  Administered 2018-04-20: 30 mL

## 2018-04-20 MED ORDER — PANTOPRAZOLE SODIUM 40 MG PO TBEC
40.0000 mg | DELAYED_RELEASE_TABLET | Freq: Every day | ORAL | Status: DC
  Administered 2018-04-21 (×2): 40 mg via ORAL
  Filled 2018-04-20 (×2): qty 1

## 2018-04-20 MED ORDER — PROPOFOL 10 MG/ML IV BOLUS
INTRAVENOUS | Status: AC
  Filled 2018-04-20: qty 20

## 2018-04-20 SURGICAL SUPPLY — 40 items
BAG DECANTER FOR FLEXI CONT (MISCELLANEOUS) ×2 IMPLANT
BRUSH SCRUB EZ  4% CHG (MISCELLANEOUS) ×1
BRUSH SCRUB EZ 4% CHG (MISCELLANEOUS) ×1 IMPLANT
CABLE SURG 12 DISP A/V CHANNEL (MISCELLANEOUS) ×2 IMPLANT
CANISTER SUCT 1200ML W/VALVE (MISCELLANEOUS) ×2 IMPLANT
CHLORAPREP W/TINT 26ML (MISCELLANEOUS) ×2 IMPLANT
COVER LIGHT HANDLE STERIS (MISCELLANEOUS) ×4 IMPLANT
COVER MAYO STAND STRL (DRAPES) ×2 IMPLANT
COVER WAND RF STERILE (DRAPES) ×2 IMPLANT
DRAPE C-ARM XRAY 36X54 (DRAPES) ×2 IMPLANT
DRSG TEGADERM 4X4.75 (GAUZE/BANDAGES/DRESSINGS) ×2 IMPLANT
DRSG TELFA 4X3 1S NADH ST (GAUZE/BANDAGES/DRESSINGS) ×2 IMPLANT
ELECT REM PT RETURN 9FT ADLT (ELECTROSURGICAL) ×2
ELECTRODE REM PT RTRN 9FT ADLT (ELECTROSURGICAL) ×1 IMPLANT
GLOVE BIO SURGEON STRL SZ7.5 (GLOVE) ×2 IMPLANT
GLOVE BIO SURGEON STRL SZ8 (GLOVE) ×2 IMPLANT
GOWN STRL REUS W/ TWL LRG LVL3 (GOWN DISPOSABLE) ×1 IMPLANT
GOWN STRL REUS W/ TWL XL LVL3 (GOWN DISPOSABLE) ×1 IMPLANT
GOWN STRL REUS W/TWL LRG LVL3 (GOWN DISPOSABLE) ×1
GOWN STRL REUS W/TWL XL LVL3 (GOWN DISPOSABLE) ×1
IMMOBILIZER SHDR MD LX WHT (SOFTGOODS) IMPLANT
IMMOBILIZER SHDR XL LX WHT (SOFTGOODS) ×2 IMPLANT
INTRO PACEMAKR LEAD 9FR 13CM (INTRODUCER) ×2
INTRO PACEMKR SHEATH II 7FR (MISCELLANEOUS) ×2
INTRODUCER PACEMKR LD 9FR 13CM (INTRODUCER) ×1 IMPLANT
INTRODUCER PACEMKR SHTH II 7FR (MISCELLANEOUS) ×1 IMPLANT
IPG PACE AZUR XT DR MRI W1DR01 (Pacemaker) ×1 IMPLANT
IV NS 500ML (IV SOLUTION) ×1
IV NS 500ML BAXH (IV SOLUTION) ×1 IMPLANT
KIT TURNOVER KIT A (KITS) ×2 IMPLANT
LABEL OR SOLS (LABEL) ×2 IMPLANT
LEAD CAPSURE NOVUS 5076-52CM (Lead) ×2 IMPLANT
LEAD CAPSURE NOVUS 5076-58CM (Lead) ×2 IMPLANT
MARKER SKIN DUAL TIP RULER LAB (MISCELLANEOUS) ×2 IMPLANT
PACE AZURE XT DR MRI W1DR01 (Pacemaker) ×2 IMPLANT
PACK PACE INSERTION (MISCELLANEOUS) ×2 IMPLANT
PAD ONESTEP ZOLL R SERIES ADT (MISCELLANEOUS) ×2 IMPLANT
PAD TELFA 2X3 NADH STRL (GAUZE/BANDAGES/DRESSINGS) ×2 IMPLANT
STRIP CLOSURE SKIN 1/2X4 (GAUZE/BANDAGES/DRESSINGS) ×2 IMPLANT
SUT SILK 0 SH 30 (SUTURE) ×6 IMPLANT

## 2018-04-20 NOTE — H&P (Signed)
Jump to Section ? Discontinued MedicationsDocument InformationEncounter DetailsLast Filed Vital SignsPatient ContactsPatient DemographicsPlan of TreatmentProgress NotesReason for VisitSocial HistoryVisit Diagnoses Justin Warren, generated on Jan. 22, 2020January 22, 2020 Printout Information  Document Contents Document Received Date Document Source Organization  Office Visit Jan. 22, 2020January 22, 2020 [facility]   Patient Demographics - 73 y.o. Male; born [birth year]  Patient Address Communication Language Race / Ethnicity Marital Status  [place], [place] [address] [phone] [facility]) English (Preferred) Dema Severin / Not Hispanic or Latino Married   Reason for Visit  Reason Comments  Follow-up Echo   Encounter Details  Date Type Department Care Team Description  04/06/2018 Office Visit [facility]  [place], [place] [address]  [phone]  Sydnee Levans, MD  [place]  [place], [place] [address]  [phone]  [phone])    [address], [place], [place] [address]  [phone]  [phone] (Fax)  Bradycardia (Primary Dx);  Atypical chest pain;  Atrioventricular (AV) dissociation;  Hyperlipidemia, unspecified hyperlipidemia type   Social History - documented as of this encounter Tobacco Use Types Packs/Day Years Used Date  Former Smoker      Smokeless Tobacco: Never Used      Alcohol Use Drinks/Week oz/Week Comments  Yes   rarely    Sex Assigned at [employer] Date Recorded  Not on file    Job Start Date Occupation Industry  Not on file Not on file Not on file   Travel History Travel Start Travel End  No recent travel history available.     Last Filed Vital Signs - documented in this encounter Vital Sign Reading Time Taken Comments  Blood Pressure 136/74 04/06/2018 8:46 AM EST   Pulse  41 04/06/2018 8:46 AM EST   Temperature - -   Respiratory Rate - -   Oxygen Saturation 98% 04/06/2018 8:46 AM EST   Inhaled Oxygen Concentration - -   Weight 89.7 kg (197 lb 12 oz) 04/06/2018 8:46 AM EST   Height 175.3 cm (5' 9" ) 04/06/2018 8:46 AM EST   Body Mass Index 29.2 04/06/2018 8:46 AM EST    Progress Notes - documented in this encounter Zeb Comfort, [place] - 04/06/2018 9:00 AM EST Formatting of this note might be different from the original. Established Patient Visit   Chief Complaint: Chief Complaint  Patient presents with  . Follow-up  Echo  Date of Service: 04/06/2018 Date of Birth: 08/16/1945 PCP: Margo Common, PA  History of Present Illness: Justin Warren is a 73 y.o.male patient who presents to review Holter monitor, echocardiogram, and stress test results. Reports a several month history of low energy, fatigue, and shortness of breath. Noted to be bradycardic on several occasions per home readings. Denies chest pain or lower extremity swelling. Denies syncopal/presyncopal episodes. Admits to worsening heart burn and currently taking 4 Zantac daily with moderate relief. Also admits to periodic dysphagia.   Holter monitor revealed sinus bradycardia to sinus tachycardia with occurrences of 3rd degree AV block, 2nd degree AV block type 1, 3:1 conduction AV block, 2:1 conduction AV block, and 4 pauses, the longest lasting 3.57 seconds. Stress echocardiogram revealed normal LV function with no evidence of ischemia or significant valvular abnormalities.   Past Medical and Surgical History  Past Medical History Past Medical History:  Diagnosis Date  . Hyperlipidemia  . Hypertension   Past Surgical History He has  a past surgical history that includes skin grafts (2013); Appendectomy; Endoscopic Carpal Tunnel Release (Right); back surgery ; knee surgery; Colonoscopy (12/06/2012); and flexible sigmoidoscopy (09/01/2004).   Medications and Allergies  Current  Medications  Current Outpatient Medications  Medication Sig Dispense Refill  . APPLE CIDER VINEGAR ORAL Take by mouth  . aspirin 81 MG EC tablet Take by mouth  . busPIRone (BUSPAR) 15 MG tablet Take by mouth  . FLUoxetine (PROZAC) 20 MG capsule Take by mouth  . IBUPROFEN ORAL Take 650 mg by mouth 2 (two) times daily  . ranitidine (ZANTAC) 150 MG tablet  . simvastatin (ZOCOR) 20 MG tablet Take by mouth   No current facility-administered medications for this visit.   Allergies: Amoxicillin-pot clavulanate  Social and Family History  Social History reports that he has quit smoking. He has never used smokeless tobacco. He reports current alcohol use. He reports previous drug use.  Family History Family History  Problem Relation Age of Onset  . Alzheimer's disease Mother   Review of Systems   Review of Systems: The patient denies chest pain, shortness of breath, orthopnea, paroxysmal nocturnal dyspnea, pedal edema, palpitations, heart racing, fatigue, dizziness, lightheadedness, presyncope, syncope, leg pain, leg cramping. Review of 12 Systems is negative except as described in HPI.   Physical Examination   Vitals:BP 136/74  Pulse (!) 41  Ht 175.3 cm (5' 9" )  Wt 89.7 kg (197 lb 12 oz)  SpO2 98%  BMI 29.20 kg/m  Ht:175.3 cm (5' 9" ) Wt:89.7 kg (197 lb 12 oz) FMB:WGYK surface area is 2.09 meters squared. Body mass index is 29.2 kg/m.  General: Well developed, well nourished. In no acute distress HEENT: Pupils equally reactive to light and accomodation  Neck: Supple without thyromegaly, or goiter. Carotid pulses 2+. No carotid bruits present.  Pulmonary: Clear to auscultation bilaterally; no wheezes, rales, rhonchi Cardiovascular: Bradycardic, regular rhythm. No gallops, murmurs or rubs Gastrointestinal: Soft nontender, nondistended, with normal bowel sounds Extremities: No cyanosis, clubbing, or edema Peripheral Pulses: 2+ in upper extremities, 2+ in lower extremities    Neurology: Alert and oriented X3 Pysch: Good affect. Responds appropriately  Assessment   73 y.o. male with  1. Bradycardia  2. Atypical chest pain  3. Atrioventricular (AV) dissociation  4. Hyperlipidemia, unspecified hyperlipidemia type   Plan  1. Bradycardia/AV dissociation  -Will set up for permanent pacemaker insertion; risks including bleeding, infection, etc. Were discussed in thorough detail  -Will set up in pacemaker clinic following procedure  2. Atypical chest pain  -Stress echo normal without evidence of ischemic changes; will consider GI referral for persistent heartburn  3. Hyperlipidemia  -Continue simvastatin 82m daily   No orders of the defined types were placed in this encounter.  Return after pacemaker.  I personally performed the service, non-incident to. (WP)  NICOLE LJulian Hy PA    Electronically signed by SZeb Comfort PA at 04/06/2018 1:07 PM EST   Plan of Treatment - documented as of this encounter Not on file   Visit Diagnoses - documented in this encounter Diagnosis  Bradycardia - Primary  Other specified cardiac dysrhythmias   Atypical chest pain  Other chest pain   Atrioventricular (AV) dissociation  Other specified conduction disorder   Hyperlipidemia, unspecified hyperlipidemia type    Discontinued Medications - documented as of this encounter Medication Sig Discontinue Reason Start Date End Date  cyclobenzaprine (FLEXERIL) 10 MG tablet  Take 10 mg by mouth 2 (two) times daily Therapy completed 02/14/2018 04/06/2018  Images  Patient Contacts  Contact Name Contact Address Communication Relationship to Patient  Justin Warren Unknown [phone] [facility]) [phone] [facility]) Spouse, Emergency Contact  Document Information  Primary Care Provider Other Service Providers Document Coverage Dates  Margo Common, [place] (Jan. 21, 2019January 21, 2019 - Present) DM: [phone] (Work) [phone]  (Fax) [address] [place], [place] [address] Family Medicine  Jan. 09, 2020January 09, 2020 - Jan. 13, 2020January 13, 2020   [place] [address] [place], [place] [address]   Encounter Providers Encounter Date  Zeb Comfort, [place] (Attending) DM: [phone] (Work) [phone]) [place], [place] [address] Cardiovascular Disease Jan. 09, 2020January 09, 2020 - Jan. 13, 2020January 13, 2020    Show All Sections

## 2018-04-20 NOTE — Anesthesia Preprocedure Evaluation (Signed)
Anesthesia Evaluation  Patient identified by MRN, date of birth, ID band Patient awake    Reviewed: Allergy & Precautions, H&P , NPO status , Patient's Chart, lab work & pertinent test results, reviewed documented beta blocker date and time   History of Anesthesia Complications Negative for: history of anesthetic complications  Airway Mallampati: III  TM Distance: >3 FB Neck ROM: full    Dental  (+) Dental Advidsory Given, Caps, Teeth Intact   Pulmonary shortness of breath, neg sleep apnea, neg COPD, neg recent URI, former smoker,           Cardiovascular Exercise Tolerance: Good (-) hypertension(-) angina(-) CAD, (-) Past MI, (-) Cardiac Stents and (-) CABG + dysrhythmias (bradycardia) (-) Valvular Problems/Murmurs     Neuro/Psych PSYCHIATRIC DISORDERS Anxiety Depression negative neurological ROS     GI/Hepatic Neg liver ROS, GERD  ,  Endo/Other  negative endocrine ROS  Renal/GU negative Renal ROS  negative genitourinary   Musculoskeletal   Abdominal   Peds  Hematology negative hematology ROS (+)   Anesthesia Other Findings Past Medical History: No date: GERD (gastroesophageal reflux disease)   Reproductive/Obstetrics negative OB ROS                             Anesthesia Physical Anesthesia Plan  ASA: III  Anesthesia Plan: General   Post-op Pain Management:    Induction: Intravenous  PONV Risk Score and Plan: 2 and Propofol infusion and TIVA  Airway Management Planned: Natural Airway and Nasal Cannula  Additional Equipment:   Intra-op Plan:   Post-operative Plan:   Informed Consent: I have reviewed the patients History and Physical, chart, labs and discussed the procedure including the risks, benefits and alternatives for the proposed anesthesia with the patient or authorized representative who has indicated his/her understanding and acceptance.     Dental Advisory  Given  Plan Discussed with: Anesthesiologist, CRNA and Surgeon  Anesthesia Plan Comments:         Anesthesia Quick Evaluation

## 2018-04-20 NOTE — Anesthesia Post-op Follow-up Note (Signed)
Anesthesia QCDR form completed.        

## 2018-04-20 NOTE — Op Note (Signed)
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    The risks benefits and alternatives were discussed with the patient preoperatively including but not limited to the risks of infection, bleeding, cardiopulmonary complications, the need for revision surgery, among others, and the patient was willing to proceed.   OPERATIVE PROCEDURE: The patient was brought to the operating room fasting state.  The left pectoral region was prepped and draped in usual sterile manner.  Anesthesia was obtained 1% lidocaine locally.  A 6 cm section was performed of left pectoral region.  Pacemaker pocket was generated by electrocautery and blunt dissection.  Access was obtained to the left subclavian vein by fine-needle aspiration.  MRI compatible leads were positioned to the right ventricular apical septum and right atrial appendage under fluoroscopic guidance.  After proper thresholds were obtained the leads were sutured in place.  The leads were connected to a MRI compatible dual-chamber rate responsive pacemaker generator ( Medtronic [id number] ).  The pacemaker pocket was irrigated gentamicin solution.  The pacemaker generator was positioned into the pocket and the pocket was closed with 2-0 and 4-0 Vicryl, respectively.  Steri-Strips and pressure dressing were applied.  Postprocedural interrogation revealed appropriate dual-chamber atrial and ventricular sensing and pacing thresholds.  There were no periprocedural complications.

## 2018-04-20 NOTE — Interval H&P Note (Signed)
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 After consideration of risks, benefits and other options for treatment, the patient has consented to  Procedure(s): INSERTION PACEMAKER-DUAL CHAMBER (Left) as a surgical intervention .  The patient's history has been reviewed, patient examined, no change in status, stable for surgery.  I have reviewed the patient's chart and labs.  Questions were answered to the patient's satisfaction.     Justin Warren [employer]

## 2018-04-20 NOTE — Transfer of Care (Signed)
Immediate Anesthesia Transfer of Care Note  Patient: Justin Warren  Procedure(s) Performed: INSERTION PACEMAKER-DUAL CHAMBER (Left )  Patient Location: PACU  Anesthesia Type:General  Level of Consciousness: awake, alert  and oriented  Airway & Oxygen Therapy: Patient Spontanous Breathing and Patient connected to face mask oxygen  Post-op Assessment: Report given to RN and Post -op Vital signs reviewed and stable  Post vital signs: Reviewed and stable  Last Vitals:  Vitals Value Taken Time  BP    Temp    Pulse    Resp    SpO2      Last Pain:  Vitals:   04/20/18 1129  TempSrc: Tympanic  PainSc: 0-No pain         Complications: No apparent anesthesia complications

## 2018-04-21 ENCOUNTER — Ambulatory Visit: Payer: Medicare HMO

## 2018-04-21 ENCOUNTER — Encounter: Payer: Medicare HMO | Admitting: Family Medicine

## 2018-04-21 DIAGNOSIS — I442 Atrioventricular block, complete: Secondary | ICD-10-CM | POA: Diagnosis not present

## 2018-04-21 DIAGNOSIS — R001 Bradycardia, unspecified: Secondary | ICD-10-CM | POA: Diagnosis not present

## 2018-04-21 DIAGNOSIS — Z87891 Personal history of nicotine dependence: Secondary | ICD-10-CM | POA: Diagnosis not present

## 2018-04-21 DIAGNOSIS — Z820 Family history of epilepsy and other diseases of the nervous system: Secondary | ICD-10-CM | POA: Diagnosis not present

## 2018-04-21 DIAGNOSIS — Z79899 Other long term (current) drug therapy: Secondary | ICD-10-CM | POA: Diagnosis not present

## 2018-04-21 DIAGNOSIS — I441 Atrioventricular block, second degree: Secondary | ICD-10-CM | POA: Diagnosis not present

## 2018-04-21 DIAGNOSIS — Z7982 Long term (current) use of aspirin: Secondary | ICD-10-CM | POA: Diagnosis not present

## 2018-04-21 DIAGNOSIS — E785 Hyperlipidemia, unspecified: Secondary | ICD-10-CM | POA: Diagnosis not present

## 2018-04-21 DIAGNOSIS — I10 Essential (primary) hypertension: Secondary | ICD-10-CM | POA: Diagnosis not present

## 2018-04-21 MED ORDER — CEPHALEXIN 250 MG PO CAPS: 500.0000 mg | ORAL_CAPSULE | Freq: Two times a day (BID) | ORAL | 0 refills | Status: DC

## 2018-04-21 NOTE — Discharge Instructions (Signed)
Leave Steri-Strips on.  Patient may shower 04/22/2018.  Do not lift left arm above head.

## 2018-04-21 NOTE — Progress Notes (Signed)
Patient complaining of indigestion. Reports that he takes zantac at home for GERD. MD [place] notified. Verbal order given for Protonix 40 mg Daily. This RN to place order. Will administer as ordered.   Iran Sizer M
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
## 2018-04-21 NOTE — Plan of Care (Signed)
  Problem: Clinical Measurements: Goal: Ability to maintain clinical measurements within normal limits will improve Outcome: Progressing Goal: Will remain free from infection Outcome: Progressing   Problem: Activity: Goal: Risk for activity intolerance will decrease Outcome: Progressing   Problem: Elimination: Goal: Will not experience complications related to urinary retention Outcome: Progressing   Problem: Pain Managment: Goal: General experience of comfort will improve Outcome: Progressing   Problem: Safety: Goal: Ability to remain free from injury will improve Outcome: Progressing   Problem: Education: Goal: Knowledge of cardiac device and self-care will improve Outcome: Progressing

## 2018-04-21 NOTE — Progress Notes (Signed)
IVs and tele removed from patient. Discharge instructions given to patient along with hard copy prescription. Verbalized understanding. No acute distress at this time. Wife at bedside and will transport patient home.

## 2018-04-22 NOTE — Anesthesia Postprocedure Evaluation (Signed)
Anesthesia Post Note  Patient: Justin Warren  Procedure(s) Performed: INSERTION PACEMAKER-DUAL CHAMBER (Left )  Patient location during evaluation: PACU Anesthesia Type: General Level of consciousness: awake and alert Pain management: pain level controlled Vital Signs Assessment: post-procedure vital signs reviewed and stable Respiratory status: spontaneous breathing, nonlabored ventilation, respiratory function stable and patient connected to nasal cannula oxygen Cardiovascular status: blood pressure returned to baseline and stable Postop Assessment: no apparent nausea or vomiting Anesthetic complications: no     Last Vitals:  Vitals:   04/21/18 0529 04/21/18 0757  BP: (!) 171/79 (!) 150/82  Pulse: 74 67  Resp:    Temp:  36.5 C  SpO2: 96% 95%    Last Pain:  Vitals:   04/21/18 0757  TempSrc: Oral  PainSc: 0-No pain                 Martha Clan

## 2018-04-27 DIAGNOSIS — E785 Hyperlipidemia, unspecified: Secondary | ICD-10-CM | POA: Diagnosis not present

## 2018-04-27 DIAGNOSIS — I4589 Other specified conduction disorders: Secondary | ICD-10-CM | POA: Diagnosis not present

## 2018-04-27 DIAGNOSIS — R0789 Other chest pain: Secondary | ICD-10-CM | POA: Diagnosis not present

## 2018-04-27 DIAGNOSIS — I442 Atrioventricular block, complete: Secondary | ICD-10-CM | POA: Diagnosis not present

## 2018-05-04 ENCOUNTER — Telehealth: Payer: Self-pay | Admitting: Family Medicine

## 2018-05-04 MED ORDER — FAMOTIDINE 20 MG PO TABS: 20.0000 mg | ORAL_TABLET | Freq: Two times a day (BID) | ORAL | 0 refills | Status: DC

## 2018-05-04 NOTE — Telephone Encounter (Signed)
Pt needing to discuss ranitidine (ZANTAC) 150 MG tablet.  He is wanting to know if he can be put another kind due to the recent reviews.  He also fills it's not working well for his heartburn symptoms currently.    Please call pt back to discuss.  Thanks, [employer]

## 2018-05-04 NOTE — Telephone Encounter (Signed)
May try the Pepcid (famotidine) 20 mg BID #60 instead of the Zantac. Call report of progress in 2-3 weeks.

## 2018-05-04 NOTE — Telephone Encounter (Signed)
Patient was advised and rx was sent to pharmacy.

## 2018-05-04 NOTE — Telephone Encounter (Signed)
Please advise 
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
## 2018-05-18 ENCOUNTER — Encounter: Payer: Self-pay | Admitting: Family Medicine

## 2018-05-18 ENCOUNTER — Ambulatory Visit (INDEPENDENT_AMBULATORY_CARE_PROVIDER_SITE_OTHER): Payer: Medicare HMO

## 2018-05-18 ENCOUNTER — Ambulatory Visit (INDEPENDENT_AMBULATORY_CARE_PROVIDER_SITE_OTHER): Payer: Medicare HMO | Admitting: Family Medicine

## 2018-05-18 VITALS — BP 126/78 | HR 66 | Temp 98.2°F | Ht 67.0 in | Wt 201.4 lb

## 2018-05-18 VITALS — BP 126/78 | HR 66 | Temp 98.2°F | Resp 18 | Ht 67.0 in | Wt 201.4 lb

## 2018-05-18 DIAGNOSIS — F418 Other specified anxiety disorders: Secondary | ICD-10-CM

## 2018-05-18 DIAGNOSIS — E785 Hyperlipidemia, unspecified: Secondary | ICD-10-CM | POA: Diagnosis not present

## 2018-05-18 DIAGNOSIS — I739 Peripheral vascular disease, unspecified: Secondary | ICD-10-CM | POA: Diagnosis not present

## 2018-05-18 DIAGNOSIS — Z0000 Encounter for general adult medical examination without abnormal findings: Secondary | ICD-10-CM

## 2018-05-18 DIAGNOSIS — M21372 Foot drop, left foot: Secondary | ICD-10-CM

## 2018-05-18 DIAGNOSIS — Z9889 Other specified postprocedural states: Secondary | ICD-10-CM | POA: Diagnosis not present

## 2018-05-18 DIAGNOSIS — M21371 Foot drop, right foot: Secondary | ICD-10-CM

## 2018-05-18 DIAGNOSIS — I442 Atrioventricular block, complete: Secondary | ICD-10-CM | POA: Diagnosis not present

## 2018-05-18 DIAGNOSIS — K219 Gastro-esophageal reflux disease without esophagitis: Secondary | ICD-10-CM

## 2018-05-18 NOTE — Patient Instructions (Addendum)
Justin Warren , Thank you for taking time to come for your Medicare Wellness Visit. I appreciate your ongoing commitment to your health goals. Please review the following plan we discussed and let me know if I can assist you in the future.   Screening recommendations/referrals: Colonoscopy: Up to date, due 11/2022 Recommended yearly ophthalmology/optometry visit for glaucoma screening and checkup Recommended yearly dental visit for hygiene and checkup  Vaccinations: Influenza vaccine: Up to date Pneumococcal vaccine: Completed series Tdap vaccine: Up to date, due 03/2020 Shingles vaccine: Pt declines today.     Advanced directives: Please bring a copy of your POA (Power of Attorney) and/or Living Will to your next appointment.   Conditions/risks identified: Fall risk prevention; Increase water intake.   Next appointment: 10:40 AM today with Vernie Murders.  Preventive Care 75 Years and Older, Male Preventive care refers to lifestyle choices and visits with your health care provider that can promote health and wellness. What does preventive care include?  A yearly physical exam. This is also called an annual well check.  Dental exams once or twice a year.  Routine eye exams. Ask your health care provider how often you should have your eyes checked.  Personal lifestyle choices, including:  Daily care of your teeth and gums.  Regular physical activity.  Eating a healthy diet.  Avoiding tobacco and drug use.  Limiting alcohol use.  Practicing safe sex.  Taking low doses of aspirin every day.  Taking vitamin and mineral supplements as recommended by your health care provider. What happens during an annual well check? The services and screenings done by your health care provider during your annual well check will depend on your age, overall health, lifestyle risk factors, and family history of disease. Counseling  Your health care provider may ask you questions about  your:  Alcohol use.  Tobacco use.  Drug use.  Emotional well-being.  Home and relationship well-being.  Sexual activity.  Eating habits.  History of falls.  Memory and ability to understand (cognition).  Work and work Statistician. Screening  You may have the following tests or measurements:  Height, weight, and BMI.  Blood pressure.  Lipid and cholesterol levels. These may be checked every 5 years, or more frequently if you are over 32 years old.  Skin check.  Lung cancer screening. You may have this screening every year starting at age 54 if you have a 30-pack-year history of smoking and currently smoke or have quit within the past 15 years.  Fecal occult blood test (FOBT) of the stool. You may have this test every year starting at age 61.  Flexible sigmoidoscopy or colonoscopy. You may have a sigmoidoscopy every 5 years or a colonoscopy every 10 years starting at age 110.  Prostate cancer screening. Recommendations will vary depending on your family history and other risks.  Hepatitis C blood test.  Hepatitis B blood test.  Sexually transmitted disease (STD) testing.  Diabetes screening. This is done by checking your blood sugar (glucose) after you have not eaten for a while (fasting). You may have this done every 1-3 years.  Abdominal aortic aneurysm (AAA) screening. You may need this if you are a current or former smoker.  Osteoporosis. You may be screened starting at age 40 if you are at high risk. Talk with your health care provider about your test results, treatment options, and if necessary, the need for more tests. Vaccines  Your health care provider may recommend certain vaccines, such as:  Influenza  vaccine. This is recommended every year.  Tetanus, diphtheria, and acellular pertussis (Tdap, Td) vaccine. You may need a Td booster every 10 years.  Zoster vaccine. You may need this after age 25.  Pneumococcal 13-valent conjugate (PCV13) vaccine.  One dose is recommended after age 76.  Pneumococcal polysaccharide (PPSV23) vaccine. One dose is recommended after age 43. Talk to your health care provider about which screenings and vaccines you need and how often you need them. This information is not intended to replace advice given to you by your health care provider. Make sure you discuss any questions you have with your health care provider. Document Released: 04/11/2015 Document Revised: 12/03/2015 Document Reviewed: 01/14/2015 Elsevier Interactive Patient Education  2017 [place] Prevention in the Home Falls can cause injuries. They can happen to people of all ages. There are many things you can do to make your home safe and to help prevent falls. What can I do on the outside of my home?  Regularly fix the edges of walkways and driveways and fix any cracks.  Remove anything that might make you trip as you walk through a door, such as a raised step or threshold.  Trim any bushes or trees on the path to your home.  Use bright outdoor lighting.  Clear any walking paths of anything that might make someone trip, such as rocks or tools.  Regularly check to see if handrails are loose or broken. Make sure that both sides of any steps have handrails.  Any raised decks and porches should have guardrails on the edges.  Have any leaves, snow, or ice cleared regularly.  Use sand or salt on walking paths during winter.  Clean up any spills in your garage right away. This includes oil or grease spills. What can I do in the bathroom?  Use night lights.  Install grab bars by the toilet and in the tub and shower. Do not use towel bars as grab bars.  Use non-skid mats or decals in the tub or shower.  If you need to sit down in the shower, use a plastic, non-slip stool.  Keep the floor dry. Clean up any water that spills on the floor as soon as it happens.  Remove soap buildup in the tub or shower regularly.  Attach bath  mats securely with double-sided non-slip rug tape.  Do not have throw rugs and other things on the floor that can make you trip. What can I do in the bedroom?  Use night lights.  Make sure that you have a light by your bed that is easy to reach.  Do not use any sheets or blankets that are too big for your bed. They should not hang down onto the floor.  Have a firm chair that has side arms. You can use this for support while you get dressed.  Do not have throw rugs and other things on the floor that can make you trip. What can I do in the kitchen?  Clean up any spills right away.  Avoid walking on wet floors.  Keep items that you use a lot in easy-to-reach places.  If you need to reach something above you, use a strong step stool that has a grab bar.  Keep electrical cords out of the way.  Do not use floor polish or wax that makes floors slippery. If you must use wax, use non-skid floor wax.  Do not have throw rugs and other things on the floor that can  make you trip. What can I do with my stairs?  Do not leave any items on the stairs.  Make sure that there are handrails on both sides of the stairs and use them. Fix handrails that are broken or loose. Make sure that handrails are as long as the stairways.  Check any carpeting to make sure that it is firmly attached to the stairs. Fix any carpet that is loose or worn.  Avoid having throw rugs at the top or bottom of the stairs. If you do have throw rugs, attach them to the floor with carpet tape.  Make sure that you have a light switch at the top of the stairs and the bottom of the stairs. If you do not have them, ask someone to add them for you. What else can I do to help prevent falls?  Wear shoes that:  Do not have high heels.  Have rubber bottoms.  Are comfortable and fit you well.  Are closed at the toe. Do not wear sandals.  If you use a stepladder:  Make sure that it is fully opened. Do not climb a closed  stepladder.  Make sure that both sides of the stepladder are locked into place.  Ask someone to hold it for you, if possible.  Clearly mark and make sure that you can see:  Any grab bars or handrails.  First and last steps.  Where the edge of each step is.  Use tools that help you move around (mobility aids) if they are needed. These include:  Canes.  Walkers.  Scooters.  Crutches.  Turn on the lights when you go into a dark area. Replace any light bulbs as soon as they burn out.  Set up your furniture so you have a clear path. Avoid moving your furniture around.  If any of your floors are uneven, fix them.  If there are any pets around you, be aware of where they are.  Review your medicines with your doctor. Some medicines can make you feel dizzy. This can increase your chance of falling. Ask your doctor what other things that you can do to help prevent falls. This information is not intended to replace advice given to you by your health care provider. Make sure you discuss any questions you have with your health care provider. Document Released: 01/09/2009 Document Revised: 08/21/2015 Document Reviewed: 04/19/2014 Elsevier Interactive Patient Education  2017 [employer].

## 2018-05-18 NOTE — Progress Notes (Addendum)
Subjective:   Justin Warren is a 73 y.o. male who presents for Medicare Annual/Subsequent preventive examination.  Review of Systems:  N/A  Cardiac Risk Factors include: advanced age (>82mn, >>60women);dyslipidemia;male gender;obesity (BMI >30kg/m2)     Objective:    Vitals: BP 126/78 (BP Location: Right Arm)   Pulse 66   Temp 98.2 F (36.8 C) (Oral)   Ht 5' 7"  (1.702 m)   Wt 201 lb 6.4 oz (91.4 kg)   BMI 31.54 kg/m   Body mass index is 31.54 kg/m.  Advanced Directives 05/18/2018 04/20/2018 04/20/2018 04/14/2018 09/07/2017 04/15/2017  Does Patient Have a Medical Advance Directive? Yes No No No No Yes  Type of AParamedicof AShawnee [place]Living will - - - - HPress photographerLiving will  Does patient want to make changes to medical advance directive? - No - Patient declined - - - -  Copy of HEyotain Chart? - - - - - No - copy requested  Would patient like information on creating a medical advance directive? - No - Patient declined - No - Patient declined No - Patient declined -    Tobacco Social History   Tobacco Use  Smoking Status Former Smoker  . Packs/day: 2.00  . Years: 30.00  . Pack years: 60.00  . Types: Cigarettes  Smokeless Tobacco Never Used  Tobacco Comment   quit in 1991-1992     Counseling given: Not Answered Comment: quit in 1991-1992   Clinical Intake:  Pre-visit preparation completed: Yes  Pain : No/denies pain Pain Score: 0-No pain     Nutritional Status: BMI > 30  Obese Nutritional Risks: None Diabetes: No  How often do you need to have someone help you when you read instructions, pamphlets, or other written materials from your doctor or pharmacy?: 1 - Never  Interpreter Needed?: No  Information [facility] :: [facility]  Past Medical History:  Diagnosis Date  . GERD (gastroesophageal reflux disease)   . Hyperlipidemia    Past Surgical History:  Procedure Laterality Date    . APPENDECTOMY  1994  . CARPAL TUNNEL RELEASE  2012  . LUMBAR LAMINECTOMIES  1976 & 1978   Dr. DRolin Barry . 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 SIGMOIDOSCOPY  2010   Family History  Problem Relation Age of Onset  . Dementia Mother   . GER disease Mother   . Hypertension Father   . Arrhythmia Sister   . Heart murmur Sister    Social History   Socioeconomic History  . Marital status: Married    Spouse name: Not on file  . Number of children: 3  . Years of education: Not on file  . Highest education level: Bachelor's degree (e.g., BA, AB, BS)  Occupational History  . Occupation: retired  SScientific laboratory technician . Financial resource strain: Not hard at all  . Food insecurity:    Worry: Never true    Inability: Never true  . Transportation needs:    Medical: No    Non-medical: No  Tobacco Use  . Smoking status: Former Smoker    Packs/day: 2.00    Years: 30.00    Pack years: 60.00    Types: Cigarettes  . Smokeless tobacco: Never Used  . Tobacco comment: quit in 1991-1992  Substance and Sexual Activity  . Alcohol use: Not  Currently    Alcohol/week: 0.0 standard drinks    Frequency: Never  . Drug use: No  . Sexual activity: Not on file  Lifestyle  . Physical activity:    Days per week: 0 days    Minutes per session: 0 min  . Stress: Not at all  Relationships  . Social connections:    Talks on phone: Patient refused    Gets together: Patient refused    Attends religious service: Patient refused    Active member of club or organization: Patient refused    Attends meetings of clubs or organizations: Patient refused    Relationship status: Patient refused  Other Topics Concern  . Not on file  Social History Narrative  . Not on file    Outpatient Encounter Medications as of 05/18/2018  Medication Sig  . busPIRone (BUSPAR) 15  MG tablet Take 1 tablet (15 mg total) 2 (two) times daily by mouth.  . famotidine (PEPCID) 20 MG tablet Take 1 tablet (20 mg total) by mouth 2 (two) times daily.  Marland Kitchen FLUoxetine (PROZAC) 20 MG capsule Take 1 capsule (20 mg total) by mouth daily.  . ranitidine (ZANTAC) 150 MG tablet TAKE 1 TABLET TWICE DAILY (Patient taking differently: Take 150 mg by mouth 2 (two) times daily. As needed)  . simvastatin (ZOCOR) 20 MG tablet Take 1 tablet (20 mg total) by mouth daily.  . Aspirin 81 MG EC tablet Take 81 mg by mouth daily.   . cephALEXin (KEFLEX) 250 MG capsule Take 2 capsules (500 mg total) by mouth 2 (two) times daily. (Patient not taking: Reported on 05/18/2018)  . gabapentin (NEURONTIN) 300 MG capsule Take 300 mg by mouth 2 (two) times daily.    No facility-administered encounter medications on file as of 05/18/2018.     Activities of Daily Living In your present state of health, do you have any difficulty performing the following activities: 05/18/2018 04/20/2018  Hearing? Y -  Comment Wears bilateral hearing aids occasionally. -  Vision? N -  Difficulty concentrating or making decisions? N -  Walking or climbing stairs? Y -  Comment Due to SOB.  -  Dressing or bathing? N -  Doing errands, shopping? N N  Preparing Food and eating ? N -  Using the Toilet? N -  In the past six months, have you accidently leaked urine? N -  Do you have problems with loss of bowel control? N -  Managing your Medications? N -  Managing your Finances? N -  Housekeeping or managing your Housekeeping? N -  Some recent data might be hidden    Patient Care Team: Chrismon, Vickki Muff, PA as PCP - General (Family Medicine) Patty, A. Joneen Caraway, MD as Consulting Physician (Ophthalmology) Ubaldo Glassing Javier Docker, MD as Consulting Physician (Cardiology)   Assessment:   This is a routine wellness examination for Justin Warren.  Exercise Activities and Dietary recommendations Current Exercise Habits: The patient does not participate in  regular exercise at present, Exercise limited by: respiratory conditions(s)  Goals    . DIET - INCREASE WATER INTAKE     Recommend increasing water intake to 4 glasses a day.     Marland Kitchen LIFESTYLE - DECREASE FALLS RISK     Recommend to remove any items from the home that may cause slips or trips.        Fall Risk Fall Risk  05/18/2018 04/15/2017 12/13/2016 04/09/2016 04/03/2015  Falls in the past year? 1 Yes No Yes No  Number falls in  past yr: 1 2 or more - 1 -  Injury with Fall? 0 No - No -  Risk for fall due to : Impaired mobility - - - -  Follow up Falls prevention discussed Falls prevention discussed - - -   FALL RISK PREVENTION PERTAINING TO THE HOME: Any stairs in or around the home? Yes  If so, do they handrails? Yes   Home free of loose throw rugs in walkways, pet beds, electrical cords, etc? Yes  Adequate lighting in your home to reduce risk of falls? Yes   ASSISTIVE DEVICES UTILIZED TO PREVENT FALLS:  Life alert? No  Use of a cane, walker or w/c? No  Grab bars in the bathroom? Yes  Shower chair or bench in shower? Yes  Elevated toilet seat or a handicapped toilet? Yes    TIMED UP AND GO:  Was the test performed? No .    Depression Screen PHQ 2/9 Scores 05/18/2018 05/18/2018 04/15/2017 12/13/2016  PHQ - 2 Score 0 0 0 0  PHQ- 9 Score 0 - - 4    Cognitive Function     6CIT Screen 05/18/2018 04/15/2017  What Year? 0 points 0 points  What month? 0 points 0 points  What time? 0 points 0 points  Count back from 20 0 points 0 points  Months in reverse 0 points 0 points  Repeat phrase 0 points 0 points  Total Score 0 0    Immunization History  Administered Date(s) Administered  . Influenza Split 01/08/2010  . Influenza, High Dose Seasonal PF 04/01/2014, 02/14/2018  . Influenza,inj,Quad PF,6+ Mos 03/20/2013  . Influenza-Unspecified 01/18/2015  . Pneumococcal Conjugate-13 04/01/2014  . Pneumococcal Polysaccharide-23 04/03/2015  . Zoster 03/01/2011    Qualifies for  Shingles Vaccine? Yes  Zostavax completed 03/01/11. Due for Shingrix. Education has been provided regarding the importance of this vaccine. Pt has been advised to call insurance company to determine out of pocket expense. Advised may also receive vaccine at local pharmacy or Health Dept. Verbalized acceptance and understanding.  Tdap: Up to date  Flu Vaccine: Up to date  Pneumococcal Vaccine: Up to date   Screening Tests Health Maintenance  Topic Date Due  . TETANUS/TDAP  03/29/2020  . COLONOSCOPY  12/07/2022  . INFLUENZA VACCINE  Completed  . Hepatitis C Screening  Completed  . PNA vac Low Risk Adult  Completed   Cancer Screenings:  Colorectal Screening: Completed 12/06/12. Repeat every 10 years.  Lung Cancer Screening: (Low Dose CT Chest recommended if Age 4-80 years, 30 pack-year currently smoking OR have quit w/in 15years.) does not qualify.    Additional Screening:  Hepatitis C Screening: Up to date  Vision Screening: Recommended annual ophthalmology exams for early detection of glaucoma and other disorders of the eye.  Dental Screening: Recommended annual dental exams for proper oral hygiene  Community Resource Referral:  CRR required this visit?  No        Plan:  I have personally reviewed and addressed the Medicare Annual Wellness questionnaire and have noted the following in the patient's chart:  A. Medical and social history B. Use of alcohol, tobacco or illicit drugs  C. Current medications and supplements D. Functional ability and status E.  Nutritional status F.  Physical activity G. Advance directives H. List of other physicians I.  Hospitalizations, surgeries, and ER visits in previous 12 months J.  [place] such as hearing and vision if needed, cognitive and depression L. Referrals and appointments - none  In addition, I have reviewed and discussed with patient certain preventive protocols, quality metrics, and best practice  recommendations. A written personalized care plan for preventive services as well as general preventive health recommendations were provided to patient.  See attached scanned questionnaire for additional information.   Signed,  Fabio Neighbors, LPN Nurse Health Advisor   Nurse Recommendations: None.   Reviewed documentation and recommendations of Nurse Health Advisor's Wellness Screening. Was available for consultation. Agree with note and plan.

## 2018-05-22 DIAGNOSIS — E7800 Pure hypercholesterolemia, unspecified: Secondary | ICD-10-CM | POA: Diagnosis not present

## 2018-05-22 DIAGNOSIS — F419 Anxiety disorder, unspecified: Secondary | ICD-10-CM | POA: Diagnosis not present

## 2018-05-22 DIAGNOSIS — K219 Gastro-esophageal reflux disease without esophagitis: Secondary | ICD-10-CM | POA: Diagnosis not present

## 2018-05-22 DIAGNOSIS — M5416 Radiculopathy, lumbar region: Secondary | ICD-10-CM | POA: Diagnosis not present

## 2018-05-22 DIAGNOSIS — I442 Atrioventricular block, complete: Secondary | ICD-10-CM | POA: Diagnosis not present

## 2018-05-22 DIAGNOSIS — I739 Peripheral vascular disease, unspecified: Secondary | ICD-10-CM | POA: Diagnosis not present

## 2018-05-23 LAB — CBC WITH DIFFERENTIAL/PLATELET
Basophils Absolute: 0.1 10*3/uL (ref 0.0–0.2)
Basos: 1 %
EOS (ABSOLUTE): 0.2 10*3/uL (ref 0.0–0.4)
Eos: 3 %
Hematocrit: 43.8 % (ref 37.5–51.0)
Hemoglobin: 15.5 g/dL (ref 13.0–17.7)
Immature Grans (Abs): 0 10*3/uL (ref 0.0–0.1)
Immature Granulocytes: 0 %
LYMPHS ABS: 1.4 10*3/uL (ref 0.7–3.1)
Lymphs: 24 %
MCH: 29.6 pg (ref 26.6–33.0)
MCHC: 35.4 g/dL (ref 31.5–35.7)
MCV: 84 fL (ref 79–97)
MONOS ABS: 0.5 10*3/uL (ref 0.1–0.9)
Monocytes: 9 %
Neutrophils Absolute: 3.6 10*3/uL (ref 1.4–7.0)
Neutrophils: 63 %
PLATELETS: 249 10*3/uL (ref 150–450)
RBC: 5.24 x10E6/uL (ref 4.14–5.80)
RDW: 14.4 % (ref 11.6–15.4)
WBC: 5.7 10*3/uL (ref 3.4–10.8)

## 2018-05-23 LAB — COMPREHENSIVE METABOLIC PANEL
ALT: 25 IU/L (ref 0–44)
AST: 24 IU/L (ref 0–40)
Albumin/Globulin Ratio: 2 (ref 1.2–2.2)
Albumin: 4.5 g/dL (ref 3.7–4.7)
Alkaline Phosphatase: 109 IU/L (ref 39–117)
BUN/Creatinine Ratio: 11 (ref 10–24)
BUN: 10 mg/dL (ref 8–27)
Bilirubin Total: 0.7 mg/dL (ref 0.0–1.2)
CO2: 22 mmol/L (ref 20–29)
Calcium: 9.8 mg/dL (ref 8.6–10.2)
Chloride: 103 mmol/L (ref 96–106)
Creatinine, Ser: 0.93 mg/dL (ref 0.76–1.27)
GFR calc Af Amer: 93 mL/min/{1.73_m2} (ref >59)
GFR calc non Af Amer: 80 mL/min/{1.73_m2} (ref >59)
Globulin, Total: 2.2 g/dL (ref 1.5–4.5)
Glucose: 98 mg/dL (ref 65–99)
Potassium: 4.2 mmol/L (ref 3.5–5.2)
Sodium: 142 mmol/L (ref 134–144)
Total Protein: 6.7 g/dL (ref 6.0–8.5)

## 2018-05-23 LAB — LIPID PANEL
Chol/HDL Ratio: 4.1 ratio (ref 0.0–5.0)
Cholesterol, Total: 153 mg/dL (ref 100–199)
HDL: 37 mg/dL — ABNORMAL LOW (ref >39)
LDL Calculated: 88 mg/dL (ref 0–99)
Triglycerides: 139 mg/dL (ref 0–149)
VLDL Cholesterol Cal: 28 mg/dL (ref 5–40)

## 2018-05-23 LAB — TSH: TSH: 1.35 u[IU]/mL (ref 0.450–4.500)

## 2018-05-25 ENCOUNTER — Telehealth: Payer: Self-pay | Admitting: Family Medicine

## 2018-05-25 NOTE — Telephone Encounter (Signed)
LMOVM for pt to return call 

## 2018-05-25 NOTE — Telephone Encounter (Signed)
Patient was advised.  

## 2018-05-25 NOTE — Telephone Encounter (Signed)
Do you no where pt can find this?

## 2018-05-25 NOTE — Telephone Encounter (Signed)
I get mine at [facility] (500 mg qd).

## 2018-05-25 NOTE — Telephone Encounter (Signed)
Pt cannot find the Flush Free Niacin. Please call pt back and advise.  Thanks, [employer]

## 2018-05-28 ENCOUNTER — Other Ambulatory Visit: Payer: Self-pay | Admitting: Family Medicine

## 2018-05-31 ENCOUNTER — Telehealth: Payer: Self-pay | Admitting: Family Medicine

## 2018-05-31 NOTE — Telephone Encounter (Signed)
Patient would like famotidine rx resent to [facility].

## 2018-05-31 NOTE — Telephone Encounter (Signed)
[place] [facility] faxed refill request for the following medications:  famotidine (PEPCID) 20 MG tablet    Please advise.

## 2018-06-01 ENCOUNTER — Other Ambulatory Visit: Payer: Self-pay | Admitting: Family Medicine

## 2018-06-01 MED ORDER — FAMOTIDINE 20 MG PO TABS: 20.0000 mg | ORAL_TABLET | Freq: Two times a day (BID) | ORAL | 3 refills | Status: DC

## 2018-06-01 NOTE — Progress Notes (Signed)
Patient asked Famotidine prescription to be switched to [facility].

## 2018-06-01 NOTE — Telephone Encounter (Signed)
Done

## 2018-06-13 DIAGNOSIS — M5136 Other intervertebral disc degeneration, lumbar region: Secondary | ICD-10-CM | POA: Diagnosis not present

## 2018-06-23 DIAGNOSIS — M5136 Other intervertebral disc degeneration, lumbar region: Secondary | ICD-10-CM | POA: Diagnosis not present

## 2018-07-14 ENCOUNTER — Other Ambulatory Visit: Payer: Self-pay | Admitting: Family Medicine

## 2018-07-14 DIAGNOSIS — F419 Anxiety disorder, unspecified: Secondary | ICD-10-CM

## 2018-07-19 ENCOUNTER — Other Ambulatory Visit: Payer: Self-pay

## 2018-07-19 DIAGNOSIS — F419 Anxiety disorder, unspecified: Secondary | ICD-10-CM

## 2018-07-19 MED ORDER — BUSPIRONE HCL 15 MG PO TABS: 15.0000 mg | ORAL_TABLET | Freq: Two times a day (BID) | ORAL | 0 refills | Status: DC

## 2018-07-25 DIAGNOSIS — I442 Atrioventricular block, complete: Secondary | ICD-10-CM | POA: Diagnosis not present

## 2018-08-10 DIAGNOSIS — M1711 Unilateral primary osteoarthritis, right knee: Secondary | ICD-10-CM | POA: Diagnosis not present

## 2018-08-23 DIAGNOSIS — M179 Osteoarthritis of knee, unspecified: Secondary | ICD-10-CM | POA: Diagnosis not present

## 2018-08-23 DIAGNOSIS — M1711 Unilateral primary osteoarthritis, right knee: Secondary | ICD-10-CM | POA: Diagnosis not present

## 2018-08-29 DIAGNOSIS — M1711 Unilateral primary osteoarthritis, right knee: Secondary | ICD-10-CM | POA: Diagnosis not present

## 2018-08-31 ENCOUNTER — Other Ambulatory Visit: Payer: Self-pay | Admitting: Family Medicine

## 2018-08-31 NOTE — Telephone Encounter (Signed)
Please review

## 2018-10-04 DIAGNOSIS — M1711 Unilateral primary osteoarthritis, right knee: Secondary | ICD-10-CM | POA: Diagnosis not present

## 2018-10-04 DIAGNOSIS — M25561 Pain in right knee: Secondary | ICD-10-CM | POA: Diagnosis not present

## 2018-10-09 ENCOUNTER — Telehealth: Payer: Self-pay | Admitting: Family Medicine

## 2018-10-09 NOTE — Chronic Care Management (AMB) (Signed)
°  Chronic Care Management   Outreach Note  10/09/2018 Name: Justin Warren MRN: [id number] DOB: [birth year]  Referred by: Margo Common, PA Reason for referral : Chronic Care Management (Third CCM outreach was unsuccessful. )   Third unsuccessful telephone outreach was attempted today. The patient was referred to the case management team for assistance with chronic care management and care coordination. The patient's primary care provider has been notified of our unsuccessful attempts to make or maintain contact with the patient. The care management team is pleased to engage with this patient at any time in the future should he/she be interested in assistance from the care management team.   Follow Up Plan: The care management team is available to follow up with the patient after provider conversation with the patient regarding recommendation for care management engagement and subsequent re-referral to the care management team.   [place]  ??[email or web address]   ??[phone]

## 2018-10-10 DIAGNOSIS — M25561 Pain in right knee: Secondary | ICD-10-CM | POA: Diagnosis not present

## 2018-10-10 DIAGNOSIS — M1711 Unilateral primary osteoarthritis, right knee: Secondary | ICD-10-CM | POA: Diagnosis not present

## 2018-10-13 DIAGNOSIS — M25561 Pain in right knee: Secondary | ICD-10-CM | POA: Diagnosis not present

## 2018-10-13 DIAGNOSIS — M1711 Unilateral primary osteoarthritis, right knee: Secondary | ICD-10-CM | POA: Diagnosis not present

## 2018-10-17 DIAGNOSIS — M25561 Pain in right knee: Secondary | ICD-10-CM | POA: Diagnosis not present

## 2018-10-17 DIAGNOSIS — M1711 Unilateral primary osteoarthritis, right knee: Secondary | ICD-10-CM | POA: Diagnosis not present

## 2018-10-19 DIAGNOSIS — M25561 Pain in right knee: Secondary | ICD-10-CM | POA: Diagnosis not present

## 2018-10-19 DIAGNOSIS — M1711 Unilateral primary osteoarthritis, right knee: Secondary | ICD-10-CM | POA: Diagnosis not present

## 2018-10-23 DIAGNOSIS — M1711 Unilateral primary osteoarthritis, right knee: Secondary | ICD-10-CM | POA: Diagnosis not present

## 2018-10-23 DIAGNOSIS — M25561 Pain in right knee: Secondary | ICD-10-CM | POA: Diagnosis not present

## 2018-10-25 DIAGNOSIS — E785 Hyperlipidemia, unspecified: Secondary | ICD-10-CM | POA: Diagnosis not present

## 2018-10-25 DIAGNOSIS — R001 Bradycardia, unspecified: Secondary | ICD-10-CM | POA: Diagnosis not present

## 2018-10-25 DIAGNOSIS — I4589 Other specified conduction disorders: Secondary | ICD-10-CM | POA: Diagnosis not present

## 2018-10-25 DIAGNOSIS — I442 Atrioventricular block, complete: Secondary | ICD-10-CM | POA: Diagnosis not present

## 2018-10-31 DIAGNOSIS — M25561 Pain in right knee: Secondary | ICD-10-CM | POA: Diagnosis not present

## 2018-10-31 DIAGNOSIS — M1711 Unilateral primary osteoarthritis, right knee: Secondary | ICD-10-CM | POA: Diagnosis not present

## 2018-11-07 DIAGNOSIS — M1711 Unilateral primary osteoarthritis, right knee: Secondary | ICD-10-CM | POA: Diagnosis not present

## 2018-11-07 DIAGNOSIS — I442 Atrioventricular block, complete: Secondary | ICD-10-CM | POA: Diagnosis not present

## 2018-11-21 DIAGNOSIS — Z01818 Encounter for other preprocedural examination: Secondary | ICD-10-CM | POA: Diagnosis not present

## 2018-11-21 DIAGNOSIS — M25661 Stiffness of right knee, not elsewhere classified: Secondary | ICD-10-CM | POA: Diagnosis not present

## 2018-11-21 DIAGNOSIS — M25561 Pain in right knee: Secondary | ICD-10-CM | POA: Diagnosis not present

## 2018-11-23 ENCOUNTER — Ambulatory Visit (INDEPENDENT_AMBULATORY_CARE_PROVIDER_SITE_OTHER): Payer: Medicare HMO | Admitting: Family Medicine

## 2018-11-23 ENCOUNTER — Other Ambulatory Visit: Payer: Self-pay

## 2018-11-23 VITALS — BP 158/88 | HR 73 | Temp 96.9°F | Wt 193.0 lb

## 2018-11-23 DIAGNOSIS — Z9889 Other specified postprocedural states: Secondary | ICD-10-CM | POA: Diagnosis not present

## 2018-11-23 DIAGNOSIS — E7800 Pure hypercholesterolemia, unspecified: Secondary | ICD-10-CM

## 2018-11-23 DIAGNOSIS — M21372 Foot drop, left foot: Secondary | ICD-10-CM | POA: Diagnosis not present

## 2018-11-23 DIAGNOSIS — Z01818 Encounter for other preprocedural examination: Secondary | ICD-10-CM | POA: Diagnosis not present

## 2018-11-23 DIAGNOSIS — M1711 Unilateral primary osteoarthritis, right knee: Secondary | ICD-10-CM

## 2018-11-23 DIAGNOSIS — F329 Major depressive disorder, single episode, unspecified: Secondary | ICD-10-CM | POA: Diagnosis not present

## 2018-11-23 DIAGNOSIS — M21371 Foot drop, right foot: Secondary | ICD-10-CM

## 2018-11-23 DIAGNOSIS — I442 Atrioventricular block, complete: Secondary | ICD-10-CM

## 2018-11-23 DIAGNOSIS — M1990 Unspecified osteoarthritis, unspecified site: Secondary | ICD-10-CM | POA: Diagnosis not present

## 2018-11-23 NOTE — Progress Notes (Signed)
TORRELL KRUTZ  MRN: [id number] DOB: Apr 01, 1945  Subjective:  HPI   The patient is a 73 year old male who presents for surgical clearance.  He states he is to have right traditional TKA knee surgery on 12/01/18. Having constant pain in the right knee and total cartilage loss in the joint (bone-on-bone). Otherwise, feeling well.  Past Surgical History:  Procedure Laterality Date  . APPENDECTOMY  1994  . CARPAL TUNNEL RELEASE  2012  . LUMBAR LAMINECTOMIES  1976 & 1978   Dr. [name]  . PACEMAKER INSERTION Left 04/20/2018   Procedure: INSERTION PACEMAKER-DUAL CHAMBER;  Surgeon: [name], MD;  Location: [facility] ORS;  Service: Cardiovascular;  Laterality: Left;  . SIGMOIDOSCOPY  2010    Patient Active Problem List   Diagnosis Date Noted  . CHB (complete heart block) ([place]) 04/20/2018  . Foot drop, bilateral 05/02/2017  . 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 Hx of decompressive lumbar laminectomy 04/09/2016  . Anxiety 03/07/2015  . Clinical depression 03/07/2015  . Difficulty hearing 03/07/2015  . Hypercholesteremia 03/07/2015  . 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 Acid reflux 06/26/2009  . External hemorrhoids without complication 78/24/2353  . 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 GERD (gastroesophageal reflux disease)   . Hyperlipidemia     Social History   Socioeconomic History  . Marital status: Married    Spouse name: Not on file  . Number of children: 3  . Years of education: Not on file  . Highest education level: Bachelor's degree (e.g., BA, AB, BS)  Occupational History  . Occupation: retired  Scientific laboratory technician  . Financial resource strain: Not hard at all  . Food insecurity    Worry: Never true    Inability: Never true  . Transportation needs    Medical: No    Non-medical: No  Tobacco Use  . Smoking status: Former Smoker   Packs/day: 2.00    Years: 30.00    Pack years: 60.00    Types: Cigarettes  . Smokeless tobacco: Never Used  . Tobacco comment: quit in 1991-1992  Substance and Sexual Activity  . Alcohol use: Not Currently    Alcohol/week: 0.0 standard drinks    Frequency: Never  . Drug use: No  . Sexual activity: Not on file  Lifestyle  . Physical activity    Days per week: 0 days    Minutes per session: 0 min  . Stress: Not at all  Relationships  . Social Herbalist on phone: Patient refused    Gets together: Patient refused    Attends religious service: Patient refused    Active member of club or organization: Patient refused    Attends meetings of clubs or organizations: Patient refused    Relationship status: Patient refused  . Intimate partner violence    Fear of current or ex partner: Patient refused    Emotionally abused: Patient refused    Physically abused: Patient refused    Forced sexual activity: Patient refused  Other Topics Concern  . Not on file  Social History Narrative  . Not on file    Outpatient Encounter Medications as of 11/23/2018  Medication Sig  . Apple Cider Vinegar 500 MG TABS Take by mouth 2 (two) times daily.  . busPIRone (BUSPAR) 15 MG tablet Take 1 tablet (15 mg total) by mouth 2 (two) times daily.  Marland Kitchen  famotidine (PEPCID) 20 MG tablet TAKE 1 TABLET BY MOUTH TWICE A DAY  . FLUoxetine (PROZAC) 20 MG capsule Take 1 capsule (20 mg total) by mouth daily.  . meloxicam (MOBIC) 15 MG tablet Take 15 mg by mouth daily.  . simvastatin (ZOCOR) 20 MG tablet Take 1 tablet (20 mg total) by mouth daily.  Marland Kitchen gabapentin (NEURONTIN) 300 MG capsule Take 300 mg by mouth 2 (two) times daily.   . [DISCONTINUED] Aspirin 81 MG EC tablet Take 81 mg by mouth daily.    No facility-administered encounter medications on file as of 11/23/2018.     Allergies  Allergen Reactions  . Amoxicillin-Pot Clavulanate Nausea And Vomiting    DID THE REACTION INVOLVE: Swelling of the  face/tongue/throat, SOB, or low BP? No Sudden or severe rash/hives, skin peeling, or the inside of the mouth or nose? No Did it require medical treatment? No When did it last happen?Over 10 years ago If all above answers are "NO", may proceed with cephalosporin use.    Review of Systems  Constitutional: Negative.   HENT: Negative.  Negative for congestion, ear pain, sinus pain and sore throat.   Respiratory: Negative.  Negative for cough, shortness of breath and wheezing.   Cardiovascular: Negative.  Negative for chest pain, palpitations and leg swelling.  Musculoskeletal: Positive for joint pain (right knee).  Skin: Negative.     Objective:  BP (!) 158/88 (BP Location: Right Arm, Patient Position: Sitting, Cuff Size: Normal)   Pulse 73   Temp (!) 96.9 F (36.1 C) (Oral)   Wt 193 lb (87.5 kg)   SpO2 96%   BMI 30.23 kg/m   Physical Exam  Constitutional: He is oriented to person, place, and time and well-developed, well-nourished, and in no distress.  HENT:  Head: Normocephalic.  Eyes: Conjunctivae are normal.  Neck: Neck supple.  Cardiovascular: Normal rate and regular rhythm.  Pacemaker in left upper chest wall. No murmur or carotid bruits. Stable paced rate and rhythm.  Pulmonary/Chest: Effort normal and breath sounds normal.  Abdominal: Soft. Bowel sounds are normal.  Musculoskeletal:        General: Tenderness present.     Comments: Heavy rubbing crepitus with any passive or active flexion/extension of the right knee. Normal pulses and no significant swelling of extremities.  Neurological: He is alert and oriented to person, place, and time.  Skin: No rash noted.  Psychiatric: Mood, affect and judgment normal.    Assessment and Plan :  1. Pre-operative clearance Scheduled for right TKA for severe degenerative disease in the joint causing decrease in mobility and chronic pain. With pacemaker maintaining regular heart rate/rhythm and no other significant issues, he is  considered a low to moderate risk for this surgery. - Hemoglobin A1c - CBC with Differential/Platelet - Comprehensive metabolic panel  2. Primary osteoarthritis of right knee      Severe degeneration of cartilage in the right knee with                chronic pain and decreased mobility. Cortisone injections          and conservative treatment no help with pain or mobility.  - CBC with Differential/Platelet  3. 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 Stable heart rate in normal range and no chest pains or dyspnea now. Followed by Dr. Ubaldo Glassing (cardiologist) and EKG on 10-25-18 showed atrial sensed and ventricular paced rhythm  from pacemaker. No chest pains, palpitations or significant dyspnea today. Will get routine labs and request copy of recent EKG from cardiologist. - CBC with Differential/Platelet - Lipid panel - Comprehensive metabolic panel  4. Bilateral foot-drop Evaluated by Dr. [name] (neurologist) on 05-02-17 and placed in posterior braces. Able to walk better and uses Gabapentin for peripheral neuropathy.  5. Hypercholesteremia      Stable without side effects taking Simvastatin 20 mg qd.            Trying to follow a low fat diet. - Lipid panel - Comprehensive metabolic panel  6. Hx of decompressive lumbar laminectomy Well healed scars in lower back. Polyneuropathy pains occasionally in the dorsum of the right foot. Takes the Tylenol prn with relief at night. Surgery by Dr. [name] (neurosurgeon) in [place].

## 2018-11-24 LAB — LIPID PANEL
Chol/HDL Ratio: 4.4 ratio (ref 0.0–5.0)
Cholesterol, Total: 181 mg/dL (ref 100–199)
HDL: 41 mg/dL (ref >39)
LDL Calculated: 103 mg/dL — ABNORMAL HIGH (ref 0–99)
Triglycerides: 185 mg/dL — ABNORMAL HIGH (ref 0–149)
VLDL Cholesterol Cal: 37 mg/dL (ref 5–40)

## 2018-11-24 LAB — CBC WITH DIFFERENTIAL/PLATELET
Basophils Absolute: 0.1 10*3/uL (ref 0.0–0.2)
Basos: 1 %
EOS (ABSOLUTE): 0.1 10*3/uL (ref 0.0–0.4)
Eos: 2 %
Hematocrit: 44.6 % (ref 37.5–51.0)
Hemoglobin: 15.4 g/dL (ref 13.0–17.7)
Immature Grans (Abs): 0 10*3/uL (ref 0.0–0.1)
Immature Granulocytes: 0 %
Lymphocytes Absolute: 1.7 10*3/uL (ref 0.7–3.1)
Lymphs: 27 %
MCH: 29.3 pg (ref 26.6–33.0)
MCHC: 34.5 g/dL (ref 31.5–35.7)
MCV: 85 fL (ref 79–97)
Monocytes Absolute: 0.7 10*3/uL (ref 0.1–0.9)
Monocytes: 11 %
Neutrophils Absolute: 3.6 10*3/uL (ref 1.4–7.0)
Neutrophils: 59 %
Platelets: 228 10*3/uL (ref 150–450)
RBC: 5.25 x10E6/uL (ref 4.14–5.80)
RDW: 13.7 % (ref 11.6–15.4)
WBC: 6 10*3/uL (ref 3.4–10.8)

## 2018-11-24 LAB — COMPREHENSIVE METABOLIC PANEL
ALT: 19 IU/L (ref 0–44)
AST: 25 IU/L (ref 0–40)
Albumin/Globulin Ratio: 2.7 — ABNORMAL HIGH (ref 1.2–2.2)
Albumin: 4.9 g/dL — ABNORMAL HIGH (ref 3.7–4.7)
Alkaline Phosphatase: 95 IU/L (ref 39–117)
BUN/Creatinine Ratio: 17 (ref 10–24)
BUN: 16 mg/dL (ref 8–27)
Bilirubin Total: 0.6 mg/dL (ref 0.0–1.2)
CO2: 27 mmol/L (ref 20–29)
Calcium: 10.4 mg/dL — ABNORMAL HIGH (ref 8.6–10.2)
Chloride: 99 mmol/L (ref 96–106)
Creatinine, Ser: 0.96 mg/dL (ref 0.76–1.27)
GFR calc Af Amer: 89 mL/min/{1.73_m2} (ref >59)
GFR calc non Af Amer: 77 mL/min/{1.73_m2} (ref >59)
Globulin, Total: 1.8 g/dL (ref 1.5–4.5)
Glucose: 81 mg/dL (ref 65–99)
Potassium: 4.7 mmol/L (ref 3.5–5.2)
Sodium: 141 mmol/L (ref 134–144)
Total Protein: 6.7 g/dL (ref 6.0–8.5)

## 2018-11-24 LAB — HEMOGLOBIN A1C
Est. average glucose Bld gHb Est-mCnc: 100 mg/dL
Hgb A1c MFr Bld: 5.1 % (ref 4.8–5.6)

## 2018-11-27 ENCOUNTER — Telehealth: Payer: Self-pay

## 2018-11-27 NOTE — Telephone Encounter (Signed)
Patient advised as below.  

## 2018-11-27 NOTE — Telephone Encounter (Signed)
-----   Message from Margo Common, [place] sent at 11/24/2018  5:12 PM EDT ----- Labs essentially normal except triglycerides are above the goal of <150. Continue present medications and work on lowering fats in diet. Will send this report to Dr. Harlow Mares (orthopedist) in preparation for surgery. (Print copy of lab report to send with surgical clearance form).

## 2018-11-28 ENCOUNTER — Encounter: Payer: Self-pay | Admitting: Family Medicine

## 2018-11-28 DIAGNOSIS — Z20828 Contact with and (suspected) exposure to other viral communicable diseases: Secondary | ICD-10-CM | POA: Diagnosis not present

## 2018-11-28 DIAGNOSIS — Z01812 Encounter for preprocedural laboratory examination: Secondary | ICD-10-CM | POA: Diagnosis not present

## 2018-12-01 DIAGNOSIS — Q246 Congenital heart block: Secondary | ICD-10-CM | POA: Diagnosis not present

## 2018-12-01 DIAGNOSIS — F329 Major depressive disorder, single episode, unspecified: Secondary | ICD-10-CM | POA: Diagnosis not present

## 2018-12-01 DIAGNOSIS — K219 Gastro-esophageal reflux disease without esophagitis: Secondary | ICD-10-CM | POA: Diagnosis not present

## 2018-12-01 DIAGNOSIS — G8918 Other acute postprocedural pain: Secondary | ICD-10-CM | POA: Diagnosis not present

## 2018-12-01 DIAGNOSIS — Z87891 Personal history of nicotine dependence: Secondary | ICD-10-CM | POA: Diagnosis not present

## 2018-12-01 DIAGNOSIS — E785 Hyperlipidemia, unspecified: Secondary | ICD-10-CM | POA: Diagnosis not present

## 2018-12-01 DIAGNOSIS — M25561 Pain in right knee: Secondary | ICD-10-CM | POA: Diagnosis not present

## 2018-12-01 DIAGNOSIS — M21371 Foot drop, right foot: Secondary | ICD-10-CM | POA: Diagnosis not present

## 2018-12-01 DIAGNOSIS — Z950 Presence of cardiac pacemaker: Secondary | ICD-10-CM | POA: Diagnosis not present

## 2018-12-01 DIAGNOSIS — M21372 Foot drop, left foot: Secondary | ICD-10-CM | POA: Diagnosis not present

## 2018-12-01 DIAGNOSIS — M1711 Unilateral primary osteoarthritis, right knee: Secondary | ICD-10-CM | POA: Diagnosis not present

## 2018-12-02 DIAGNOSIS — M21372 Foot drop, left foot: Secondary | ICD-10-CM | POA: Diagnosis not present

## 2018-12-02 DIAGNOSIS — M1711 Unilateral primary osteoarthritis, right knee: Secondary | ICD-10-CM | POA: Diagnosis not present

## 2018-12-02 DIAGNOSIS — Q246 Congenital heart block: Secondary | ICD-10-CM | POA: Diagnosis not present

## 2018-12-02 DIAGNOSIS — E785 Hyperlipidemia, unspecified: Secondary | ICD-10-CM | POA: Diagnosis not present

## 2018-12-02 DIAGNOSIS — Z950 Presence of cardiac pacemaker: Secondary | ICD-10-CM | POA: Diagnosis not present

## 2018-12-02 DIAGNOSIS — K219 Gastro-esophageal reflux disease without esophagitis: Secondary | ICD-10-CM | POA: Diagnosis not present

## 2018-12-02 DIAGNOSIS — Z87891 Personal history of nicotine dependence: Secondary | ICD-10-CM | POA: Diagnosis not present

## 2018-12-02 DIAGNOSIS — M21371 Foot drop, right foot: Secondary | ICD-10-CM | POA: Diagnosis not present

## 2018-12-02 DIAGNOSIS — F329 Major depressive disorder, single episode, unspecified: Secondary | ICD-10-CM | POA: Diagnosis not present

## 2018-12-03 DIAGNOSIS — Q246 Congenital heart block: Secondary | ICD-10-CM | POA: Diagnosis not present

## 2018-12-03 DIAGNOSIS — M21371 Foot drop, right foot: Secondary | ICD-10-CM | POA: Diagnosis not present

## 2018-12-03 DIAGNOSIS — Z950 Presence of cardiac pacemaker: Secondary | ICD-10-CM | POA: Diagnosis not present

## 2018-12-03 DIAGNOSIS — F329 Major depressive disorder, single episode, unspecified: Secondary | ICD-10-CM | POA: Diagnosis not present

## 2018-12-03 DIAGNOSIS — Z87891 Personal history of nicotine dependence: Secondary | ICD-10-CM | POA: Diagnosis not present

## 2018-12-03 DIAGNOSIS — M21372 Foot drop, left foot: Secondary | ICD-10-CM | POA: Diagnosis not present

## 2018-12-03 DIAGNOSIS — K219 Gastro-esophageal reflux disease without esophagitis: Secondary | ICD-10-CM | POA: Diagnosis not present

## 2018-12-03 DIAGNOSIS — E785 Hyperlipidemia, unspecified: Secondary | ICD-10-CM | POA: Diagnosis not present

## 2018-12-03 DIAGNOSIS — M1711 Unilateral primary osteoarthritis, right knee: Secondary | ICD-10-CM | POA: Diagnosis not present

## 2018-12-06 DIAGNOSIS — M25661 Stiffness of right knee, not elsewhere classified: Secondary | ICD-10-CM | POA: Diagnosis not present

## 2018-12-06 DIAGNOSIS — M25561 Pain in right knee: Secondary | ICD-10-CM | POA: Diagnosis not present

## 2018-12-06 DIAGNOSIS — Z96659 Presence of unspecified artificial knee joint: Secondary | ICD-10-CM | POA: Diagnosis not present

## 2018-12-07 DIAGNOSIS — M25561 Pain in right knee: Secondary | ICD-10-CM | POA: Diagnosis not present

## 2018-12-07 DIAGNOSIS — Z96659 Presence of unspecified artificial knee joint: Secondary | ICD-10-CM | POA: Insufficient documentation

## 2018-12-07 DIAGNOSIS — M25661 Stiffness of right knee, not elsewhere classified: Secondary | ICD-10-CM | POA: Diagnosis not present

## 2018-12-11 DIAGNOSIS — M25561 Pain in right knee: Secondary | ICD-10-CM | POA: Diagnosis not present

## 2018-12-11 DIAGNOSIS — Z96659 Presence of unspecified artificial knee joint: Secondary | ICD-10-CM | POA: Diagnosis not present

## 2018-12-11 DIAGNOSIS — M25661 Stiffness of right knee, not elsewhere classified: Secondary | ICD-10-CM | POA: Diagnosis not present

## 2018-12-13 DIAGNOSIS — M25661 Stiffness of right knee, not elsewhere classified: Secondary | ICD-10-CM | POA: Diagnosis not present

## 2018-12-13 DIAGNOSIS — Z96659 Presence of unspecified artificial knee joint: Secondary | ICD-10-CM | POA: Diagnosis not present

## 2018-12-13 DIAGNOSIS — M25561 Pain in right knee: Secondary | ICD-10-CM | POA: Diagnosis not present

## 2018-12-18 DIAGNOSIS — Z96659 Presence of unspecified artificial knee joint: Secondary | ICD-10-CM | POA: Diagnosis not present

## 2018-12-18 DIAGNOSIS — M25561 Pain in right knee: Secondary | ICD-10-CM | POA: Diagnosis not present

## 2018-12-18 DIAGNOSIS — M25661 Stiffness of right knee, not elsewhere classified: Secondary | ICD-10-CM | POA: Diagnosis not present

## 2018-12-21 DIAGNOSIS — Z96659 Presence of unspecified artificial knee joint: Secondary | ICD-10-CM | POA: Diagnosis not present

## 2018-12-21 DIAGNOSIS — M25561 Pain in right knee: Secondary | ICD-10-CM | POA: Diagnosis not present

## 2018-12-21 DIAGNOSIS — M25661 Stiffness of right knee, not elsewhere classified: Secondary | ICD-10-CM | POA: Diagnosis not present

## 2018-12-25 DIAGNOSIS — Z96659 Presence of unspecified artificial knee joint: Secondary | ICD-10-CM | POA: Diagnosis not present

## 2018-12-25 DIAGNOSIS — M25661 Stiffness of right knee, not elsewhere classified: Secondary | ICD-10-CM | POA: Diagnosis not present

## 2018-12-25 DIAGNOSIS — M25561 Pain in right knee: Secondary | ICD-10-CM | POA: Diagnosis not present

## 2018-12-28 DIAGNOSIS — M25661 Stiffness of right knee, not elsewhere classified: Secondary | ICD-10-CM | POA: Diagnosis not present

## 2018-12-28 DIAGNOSIS — M25561 Pain in right knee: Secondary | ICD-10-CM | POA: Diagnosis not present

## 2018-12-28 DIAGNOSIS — Z96659 Presence of unspecified artificial knee joint: Secondary | ICD-10-CM | POA: Diagnosis not present

## 2019-01-02 DIAGNOSIS — M21372 Foot drop, left foot: Secondary | ICD-10-CM | POA: Diagnosis not present

## 2019-01-02 DIAGNOSIS — M21371 Foot drop, right foot: Secondary | ICD-10-CM | POA: Diagnosis not present

## 2019-01-05 DIAGNOSIS — M25561 Pain in right knee: Secondary | ICD-10-CM | POA: Diagnosis not present

## 2019-01-05 DIAGNOSIS — Z96659 Presence of unspecified artificial knee joint: Secondary | ICD-10-CM | POA: Diagnosis not present

## 2019-01-05 DIAGNOSIS — M25661 Stiffness of right knee, not elsewhere classified: Secondary | ICD-10-CM | POA: Diagnosis not present

## 2019-01-09 DIAGNOSIS — Z96651 Presence of right artificial knee joint: Secondary | ICD-10-CM | POA: Diagnosis not present

## 2019-01-11 DIAGNOSIS — Z96659 Presence of unspecified artificial knee joint: Secondary | ICD-10-CM | POA: Diagnosis not present

## 2019-01-11 DIAGNOSIS — M25561 Pain in right knee: Secondary | ICD-10-CM | POA: Diagnosis not present

## 2019-01-11 DIAGNOSIS — M25661 Stiffness of right knee, not elsewhere classified: Secondary | ICD-10-CM | POA: Diagnosis not present

## 2019-01-29 DIAGNOSIS — S9032XA Contusion of left foot, initial encounter: Secondary | ICD-10-CM | POA: Diagnosis not present

## 2019-02-27 HISTORY — PX: REPLACEMENT TOTAL KNEE: SUR1224

## 2019-03-06 DIAGNOSIS — I442 Atrioventricular block, complete: Secondary | ICD-10-CM | POA: Diagnosis not present

## 2019-03-07 ENCOUNTER — Telehealth: Payer: Self-pay | Admitting: *Deleted

## 2019-03-07 NOTE — Telephone Encounter (Signed)
Please advise message below  °

## 2019-03-07 NOTE — Telephone Encounter (Signed)
Source Subject Topic  Justin Warren, Justin Warren (Patient) Justin Warren (Patient) General - Inquiry  Summary: possible signs of alzheimers  Reason for CRM: Patient called in stating he has started to notice some signs of his memory lapsing and becoming forgetful. Patient's family does have a history of Alzheimer's disease. Pt would like to know if he should start taking medicine for this or what the next step should be. Pt states he has been noticing this for a few months.

## 2019-03-08 NOTE — Telephone Encounter (Signed)
Should schedule appointment to assess this problem. May need tests and/or referral to a neurologist.

## 2019-03-16 ENCOUNTER — Ambulatory Visit: Payer: Self-pay | Admitting: Family Medicine

## 2019-03-20 ENCOUNTER — Other Ambulatory Visit: Payer: Self-pay | Admitting: Family Medicine

## 2019-03-20 MED ORDER — SIMVASTATIN 20 MG PO TABS: 20.0000 mg | ORAL_TABLET | Freq: Every day | ORAL | 3 refills | Status: DC

## 2019-03-20 NOTE — Telephone Encounter (Signed)
[facility] faxed refill request for the following medications:  simvastatin (ZOCOR) 20 MG tablet   Please advise.  Thanks, [employer]

## 2019-03-28 NOTE — Telephone Encounter (Signed)
Appointment scheduled for 04/01/2018.

## 2019-04-02 ENCOUNTER — Ambulatory Visit: Payer: Self-pay | Admitting: Family Medicine

## 2019-04-05 DIAGNOSIS — L570 Actinic keratosis: Secondary | ICD-10-CM | POA: Diagnosis not present

## 2019-04-05 DIAGNOSIS — L538 Other specified erythematous conditions: Secondary | ICD-10-CM | POA: Diagnosis not present

## 2019-04-05 DIAGNOSIS — Z85828 Personal history of other malignant neoplasm of skin: Secondary | ICD-10-CM | POA: Diagnosis not present

## 2019-04-05 DIAGNOSIS — X32XXXA Exposure to sunlight, initial encounter: Secondary | ICD-10-CM | POA: Diagnosis not present

## 2019-04-05 DIAGNOSIS — D2262 Melanocytic nevi of left upper limb, including shoulder: Secondary | ICD-10-CM | POA: Diagnosis not present

## 2019-04-05 DIAGNOSIS — D2271 Melanocytic nevi of right lower limb, including hip: Secondary | ICD-10-CM | POA: Diagnosis not present

## 2019-04-05 DIAGNOSIS — D2261 Melanocytic nevi of right upper limb, including shoulder: Secondary | ICD-10-CM | POA: Diagnosis not present

## 2019-04-05 DIAGNOSIS — L820 Inflamed seborrheic keratosis: Secondary | ICD-10-CM | POA: Diagnosis not present

## 2019-04-05 DIAGNOSIS — D485 Neoplasm of uncertain behavior of skin: Secondary | ICD-10-CM | POA: Diagnosis not present

## 2019-04-05 DIAGNOSIS — C44619 Basal cell carcinoma of skin of left upper limb, including shoulder: Secondary | ICD-10-CM | POA: Diagnosis not present

## 2019-04-23 ENCOUNTER — Ambulatory Visit: Payer: Self-pay | Admitting: Family Medicine

## 2019-04-25 DIAGNOSIS — I442 Atrioventricular block, complete: Secondary | ICD-10-CM | POA: Diagnosis not present

## 2019-04-25 DIAGNOSIS — R0789 Other chest pain: Secondary | ICD-10-CM | POA: Diagnosis not present

## 2019-04-25 DIAGNOSIS — E785 Hyperlipidemia, unspecified: Secondary | ICD-10-CM | POA: Diagnosis not present

## 2019-04-26 ENCOUNTER — Telehealth: Payer: Self-pay | Admitting: Family Medicine

## 2019-04-26 NOTE — Telephone Encounter (Signed)
Please advise 

## 2019-04-26 NOTE — Telephone Encounter (Signed)
Ronna at Dr. Thereasa Parkin office needs to know if Justin Warren needs prophylactic antibiotics before his visit today at noon.   [phone]

## 2019-04-26 NOTE — Telephone Encounter (Signed)
Ok with no prophylactic antibiotics

## 2019-04-26 NOTE — Telephone Encounter (Signed)
Justin Warren was advised. They are needing a statement in writing faxed to them. [phone]. Statement was faxed.

## 2019-05-14 ENCOUNTER — Telehealth: Payer: Self-pay | Admitting: Family Medicine

## 2019-05-14 DIAGNOSIS — F419 Anxiety disorder, unspecified: Secondary | ICD-10-CM

## 2019-05-14 NOTE — Telephone Encounter (Signed)
Pt needs a refill on generic Prozac 20 mg.  He only has 4 or 5 pills left  [facility] [place]  [phone]

## 2019-05-15 MED ORDER — FLUOXETINE HCL 20 MG PO CAPS: 20.0000 mg | ORAL_CAPSULE | Freq: Every day | ORAL | 3 refills | Status: DC

## 2019-05-15 NOTE — Telephone Encounter (Signed)
Patient has appointment on 05/21/19

## 2019-05-16 DIAGNOSIS — C44619 Basal cell carcinoma of skin of left upper limb, including shoulder: Secondary | ICD-10-CM | POA: Diagnosis not present

## 2019-05-17 NOTE — Progress Notes (Addendum)
Subjective:   Justin Warren is a 74 y.o. male who presents for Medicare Annual/Subsequent preventive examination.  Review of Systems:  N/A  Cardiac Risk Factors include: advanced age (>3mn, >>48women);male gender;dyslipidemia;obesity (BMI >30kg/m2)     Objective:    Vitals: BP 128/60 (BP Location: Right Arm)   Pulse 67   Temp 97.8 F (36.6 C) (Oral)   Ht 5' 7"  (1.702 m)   Wt 199 lb 9.6 oz (90.5 kg)   SpO2 97%   BMI 31.26 kg/m   Body mass index is 31.26 kg/m.  Advanced Directives 05/21/2019 05/18/2018 04/20/2018 04/20/2018 04/14/2018 09/07/2017 04/15/2017  Does Patient Have a Medical Advance Directive? Yes Yes No No No No Yes  Type of AParamedicof ACrock[place]Living will [place] will - - - - HPress photographerLiving will  Does patient want to make changes to medical advance directive? - - No - Patient declined - - - -  Copy of [place] Chart? No - copy requested - - - - - No - copy requested  Would patient like information on creating a medical advance directive? - - No - Patient declined - No - Patient declined No - Patient declined -    Tobacco Social History   Tobacco Use  Smoking Status Former Smoker   Packs/day: 2.00   Years: 30.00   Pack years: 60.00   Types: Cigarettes  Smokeless Tobacco Never Used  Tobacco Comment   quit in 1991-1992     Counseling given: Not Answered Comment: quit in 1991-1992   Clinical Intake:  Pre-visit preparation completed: Yes  Pain : No/denies pain Pain Score: 0-No pain     Nutritional Status: BMI > 30  Obese Nutritional Risks: None Diabetes: No  How often do you need to have someone help you when you read instructions, pamphlets, or other written materials from your doctor or pharmacy?: 1 - Never  Interpreter Needed?: No  Information [facility] :: [facility]  Past Medical History:  Diagnosis Date   GERD (gastroesophageal reflux  disease)    Hyperlipidemia    Past Surgical History:  Procedure Laterality Date   [place]  LUMBAR LAMINECTOMIES  1976 & 1978   Dr. DRolin Barry  PACEMAKER INSERTION Left 04/20/2018   Procedure: INSERTION PACEMAKER-DUAL CHAMBER;  Surgeon: PIsaias Cowman MD;  Location: [facility] ORS;  Service: Cardiovascular;  Laterality: Left;   REPLACEMENT TOTAL KNEE     SIGMOIDOSCOPY  2010   Family History  Problem Relation Age of Onset   Dementia Mother    GER disease Mother    Hypertension Father    Arrhythmia Sister    Heart murmur Sister    Social History   Socioeconomic History   Marital status: Married    Spouse name: Not on file   Number of children: 3   Years of education: Not on file   Highest education level: Bachelor's degree (e.g., BA, AB, BS)  Occupational History   Occupation: retired  Tobacco Use   Smoking status: Former Smoker    Packs/day: 2.00    Years: 30.00    Pack years: 60.00    Types: Cigarettes   Smokeless tobacco: Never Used   Tobacco comment: quit in 1991-1992  Substance and Sexual Activity   Alcohol use: Not Currently    Alcohol/week: 0.0 standard drinks    Comment: quit over 30 years ago  Drug use: No   Sexual activity: Not on file  Other Topics Concern   Not on file  Social History Narrative   Not on file   Social Determinants of Health   Financial Resource Strain: Low Risk    Difficulty of Paying Living Expenses: Not hard at all  Food Insecurity: No Food Insecurity   Worried About Running Out of Food in the Last Year: Never true   [place] in the Last Year: Never true  Transportation Needs: No Transportation Needs   Lack of Transportation (Medical): No   Lack of Transportation (Non-Medical): No  Physical Activity: Inactive   Days of Exercise per Week: 0 days   Minutes of Exercise per Session: 0 min  Stress: No Stress Concern Present   Feeling of Stress : Only a little  Social Connections:  Slightly Isolated   Frequency of Communication with Friends and Family: More than three times a week   Frequency of Social Gatherings with Friends and Family: Once a week   Attends Religious Services: More than 4 times per year   Active Member of [employer] or Organizations: No   Attends Archivist Meetings: Never   Marital Status: Married    Outpatient Encounter Medications as of 05/21/2019  Medication Sig   acetaminophen (TYLENOL) 650 MG CR tablet Take 650 mg by mouth every 8 (eight) hours as needed for pain.   Apple Cider Vinegar 500 MG TABS Take by mouth 2 (two) times daily.   aspirin 81 MG EC tablet Take 81 mg by mouth daily. Swallow whole.   busPIRone (BUSPAR) 15 MG tablet Take 1 tablet (15 mg total) by mouth 2 (two) times daily.   famotidine (PEPCID) 20 MG tablet TAKE 1 TABLET BY MOUTH TWICE A DAY   FLUoxetine (PROZAC) 20 MG capsule Take 1 capsule (20 mg total) by mouth daily.   meloxicam (MOBIC) 15 MG tablet Take 15 mg by mouth daily.   simvastatin (ZOCOR) 20 MG tablet Take 1 tablet (20 mg total) by mouth daily.   gabapentin (NEURONTIN) 300 MG capsule Take 300 mg by mouth 2 (two) times daily.    No facility-administered encounter medications on file as of 05/21/2019.    Activities of Daily Living In your present state of health, do you have any difficulty performing the following activities: 05/21/2019  Hearing? Y  Comment Bought hearing aids but is unable to wear them.  Vision? N  Difficulty concentrating or making decisions? Y  Comment Pt to discuss memory concerns at todays OV.  Walking or climbing stairs? N  Dressing or bathing? N  Doing errands, shopping? N  Preparing Food and eating ? N  Using the Toilet? N  In the past six months, have you accidently leaked urine? N  Do you have problems with loss of bowel control? N  Managing your Medications? N  Managing your Finances? N  Housekeeping or managing your Housekeeping? N  Some recent data might be hidden     Patient Care Team: Chrismon, Vickki Muff, PA as PCP - General (Family Medicine) Patty, A. Joneen Caraway, MD as Consulting Physician (Ophthalmology) Ubaldo Glassing Javier Docker, MD as Consulting Physician (Cardiology) Lovell Sheehan, MD as Consulting Physician (Orthopedic Surgery)   Assessment:   This is a routine wellness examination for [name].  Exercise Activities and Dietary recommendations Current Exercise Habits: The patient does not participate in regular exercise at present, Exercise limited by: None identified  Goals      DIET - INCREASE  WATER INTAKE     Recommend increasing water intake to 4 glasses a day.      LIFESTYLE - DECREASE FALLS RISK     Recommend to remove any items from the home that may cause slips or trips.         Fall Risk: Fall Risk  05/21/2019 05/18/2018 04/15/2017 12/13/2016 04/09/2016  Falls in the past year? 1 1 Yes No Yes  Number falls in past yr: 1 1 2  or more - 1  Injury with Fall? 1 0 No - No  Comment bruised ribs - - - -  Risk for fall due to : Impaired balance/gait Impaired mobility - - -  Follow up Falls prevention discussed Falls prevention discussed Falls prevention discussed - -    FALL RISK PREVENTION PERTAINING TO THE HOME:  Any stairs in or around the home? Yes  If so, are there any without handrails? No   Home free of loose throw rugs in walkways, pet beds, electrical cords, etc? Yes  Adequate lighting in your home to reduce risk of falls? Yes   ASSISTIVE DEVICES UTILIZED TO PREVENT FALLS:  Life alert? No  Use of a cane, walker or w/c? No  Grab bars in the bathroom? Yes  Shower chair or bench in shower? Yes  Elevated toilet seat or a handicapped toilet? Yes   TIMED UP AND GO:  Was the test performed? No .    Depression Screen PHQ 2/9 Scores 05/21/2019 05/18/2018 05/18/2018 04/15/2017  PHQ - 2 Score 4 0 0 0  PHQ- 9 Score 15 0 - -    Cognitive Function     6CIT Screen 05/21/2019 05/18/2018 04/15/2017  What Year? 0 points 0 points 0 points   What month? 0 points 0 points 0 points  What time? 0 points 0 points 0 points  Count back from 20 0 points 0 points 0 points  Months in reverse 0 points 0 points 0 points  Repeat phrase 0 points 0 points 0 points  Total Score 0 0 0    Immunization History  Administered Date(s) Administered   Influenza Split 01/08/2010   Influenza, High Dose Seasonal PF 04/01/2014, 02/14/2018, 02/05/2019   Influenza,inj,Quad PF,6+ Mos 03/20/2013   Influenza-Unspecified 01/18/2015   Pneumococcal Conjugate-13 04/01/2014   Pneumococcal Polysaccharide-23 04/03/2015   Zoster 03/01/2011    Qualifies for Shingles Vaccine? Yes  Zostavax completed 03/01/11. Due for Shingrix. Pt has been advised to call insurance company to determine out of pocket expense. Advised may also receive vaccine at local pharmacy or Health Dept. Verbalized acceptance and understanding.  Tdap: Up to date  Flu Vaccine: Up to date  Pneumococcal Vaccine: Completed series  Screening Tests Health Maintenance  Topic Date Due   TETANUS/TDAP  03/29/2020   INFLUENZA VACCINE  Completed   Hepatitis C Screening  Completed   PNA vac Low Risk Adult  Completed   Cancer Screenings:  Colorectal Screening: No longer required.   Lung Cancer Screening: (Low Dose CT Chest recommended if Age 35-80 years, 30 pack-year currently smoking OR have quit w/in 15years.) does not qualify.   Additional Screening:  Hepatitis C Screening: Up to date  Vision Screening: Recommended annual ophthalmology exams for early detection of glaucoma and other disorders of the eye.  Dental Screening: Recommended annual dental exams for proper oral hygiene  Community Resource Referral:  CRR required this visit?  No        Plan:  I have personally reviewed and addressed the Medicare Annual  Wellness questionnaire and have noted the following in the patient's chart:  A. Medical and social history B. Use of alcohol, tobacco or illicit drugs  C. Current  medications and supplements D. Functional ability and status E.  Nutritional status F.  Physical activity G. Advance directives H. List of other physicians I.  Hospitalizations, surgeries, and ER visits in previous 12 months J.  [place] such as hearing and vision if needed, cognitive and depression L. Referrals and appointments   In addition, I have reviewed and discussed with patient certain preventive protocols, quality metrics, and best practice recommendations. A written personalized care plan for preventive services as well as general preventive health recommendations were provided to patient.   Glendora Score, [place]  2/37/0230 Nurse Health Advisor   Nurse Notes: None.   Reviewed note and recommendations by Nurse Health Advisor screening. Was available for consultation. Agree with documentation and plan.

## 2019-05-21 ENCOUNTER — Other Ambulatory Visit: Payer: Self-pay

## 2019-05-21 ENCOUNTER — Ambulatory Visit (INDEPENDENT_AMBULATORY_CARE_PROVIDER_SITE_OTHER): Payer: Medicare HMO

## 2019-05-21 ENCOUNTER — Ambulatory Visit (INDEPENDENT_AMBULATORY_CARE_PROVIDER_SITE_OTHER): Payer: Medicare HMO | Admitting: Family Medicine

## 2019-05-21 ENCOUNTER — Ambulatory Visit: Payer: Medicare HMO

## 2019-05-21 VITALS — BP 128/60 | HR 67 | Temp 97.8°F | Ht 67.0 in | Wt 199.6 lb

## 2019-05-21 VITALS — BP 128/60 | HR 67 | Temp 97.8°F | Ht 67.0 in | Wt 199.0 lb

## 2019-05-21 DIAGNOSIS — M21371 Foot drop, right foot: Secondary | ICD-10-CM | POA: Diagnosis not present

## 2019-05-21 DIAGNOSIS — Z0000 Encounter for general adult medical examination without abnormal findings: Secondary | ICD-10-CM | POA: Diagnosis not present

## 2019-05-21 DIAGNOSIS — I739 Peripheral vascular disease, unspecified: Secondary | ICD-10-CM | POA: Diagnosis not present

## 2019-05-21 DIAGNOSIS — M21372 Foot drop, left foot: Secondary | ICD-10-CM

## 2019-05-21 DIAGNOSIS — E7800 Pure hypercholesterolemia, unspecified: Secondary | ICD-10-CM | POA: Diagnosis not present

## 2019-05-21 DIAGNOSIS — Z9889 Other specified postprocedural states: Secondary | ICD-10-CM

## 2019-05-21 DIAGNOSIS — F418 Other specified anxiety disorders: Secondary | ICD-10-CM

## 2019-05-21 DIAGNOSIS — I442 Atrioventricular block, complete: Secondary | ICD-10-CM

## 2019-05-21 DIAGNOSIS — R2689 Other abnormalities of gait and mobility: Secondary | ICD-10-CM | POA: Diagnosis not present

## 2019-05-21 DIAGNOSIS — R251 Tremor, unspecified: Secondary | ICD-10-CM
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
## 2019-05-21 NOTE — Patient Instructions (Signed)
Justin Warren , Thank you for taking time to come for your Medicare Wellness Visit. I appreciate your ongoing commitment to your health goals. Please review the following plan we discussed and let me know if I can assist you in the future.   Screening recommendations/referrals: Colonoscopy: No longer required.  Recommended yearly ophthalmology/optometry visit for glaucoma screening and checkup Recommended yearly dental visit for hygiene and checkup  Vaccinations: Influenza vaccine: Up to date Pneumococcal vaccine: Completed series Tdap vaccine: Up to date, due 03/2020 Shingles vaccine: Pt declines today.     Advanced directives: Fall risk prevention. Please bring a copy of your POA (Power of [place]) and/or Living Will to your next appointment.   Conditions/risks identified: Recommend to increase water intake to 6-8 8 oz glasses a day.   Next appointment: 10:40 AM today with Justin Warren 32 Years and Older, Male Preventive care refers to lifestyle choices and visits with your health care provider that can promote health and wellness. What does preventive care include?  A yearly physical exam. This is also called an annual well check.  Dental exams once or twice a year.  Routine eye exams. Ask your health care provider how often you should have your eyes checked.  Personal lifestyle choices, including:  Daily care of your teeth and gums.  Regular physical activity.  Eating a healthy diet.  Avoiding tobacco and drug use.  Limiting alcohol use.  Practicing safe sex.  Taking low doses of aspirin every day.  Taking vitamin and mineral supplements as recommended by your health care provider. What happens during an annual well check? The services and screenings done by your health care provider during your annual well check will depend on your age, overall health, lifestyle risk factors, and family history of disease. Counseling  Your health care provider may ask  you questions about your:  Alcohol use.  Tobacco use.  Drug use.  Emotional well-being.  Home and relationship well-being.  Sexual activity.  Eating habits.  History of falls.  Memory and ability to understand (cognition).  Work and work Statistician. Screening  You may have the following tests or measurements:  Height, weight, and BMI.  Blood pressure.  Lipid and cholesterol levels. These may be checked every 5 years, or more frequently if you are over 15 years old.  Skin check.  Lung cancer screening. You may have this screening every year starting at age 49 if you have a 30-pack-year history of smoking and currently smoke or have quit within the past 15 years.  Fecal occult blood test (FOBT) of the stool. You may have this test every year starting at age 49.  Flexible sigmoidoscopy or colonoscopy. You may have a sigmoidoscopy every 5 years or a colonoscopy every 10 years starting at age 74.  Prostate cancer screening. Recommendations will vary depending on your family history and other risks.  Hepatitis C blood test.  Hepatitis B blood test.  Sexually transmitted disease (STD) testing.  Diabetes screening. This is done by checking your blood sugar (glucose) after you have not eaten for a while (fasting). You may have this done every 1-3 years.  Abdominal aortic aneurysm (AAA) screening. You may need this if you are a current or former smoker.  Osteoporosis. You may be screened starting at age 5 if you are at high risk. Talk with your health care provider about your test results, treatment options, and if necessary, the need for more tests. Vaccines  Your health care provider  may recommend certain vaccines, such as:  Influenza vaccine. This is recommended every year.  Tetanus, diphtheria, and acellular pertussis (Tdap, Td) vaccine. You may need a Td booster every 10 years.  Zoster vaccine. You may need this after age 5.  Pneumococcal 13-valent conjugate  (PCV13) vaccine. One dose is recommended after age 67.  Pneumococcal polysaccharide (PPSV23) vaccine. One dose is recommended after age 84. Talk to your health care provider about which screenings and vaccines you need and how often you need them. This information is not intended to replace advice given to you by your health care provider. Make sure you discuss any questions you have with your health care provider. Document Released: 04/11/2015 Document Revised: 12/03/2015 Document Reviewed: 01/14/2015 Elsevier Interactive Patient Education  2017 [employer] Prevention in the Home Falls can cause injuries. They can happen to people of all ages. There are many things you can do to make your home safe and to help prevent falls. What can I do on the outside of my home?  Regularly fix the edges of walkways and driveways and fix any cracks.  Remove anything that might make you trip as you walk through a door, such as a raised step or threshold.  Trim any bushes or trees on the path to your home.  Use bright outdoor lighting.  Clear any walking paths of anything that might make someone trip, such as rocks or tools.  Regularly check to see if handrails are loose or broken. Make sure that both sides of any steps have handrails.  Any raised decks and porches should have guardrails on the edges.  Have any leaves, snow, or ice cleared regularly.  Use sand or salt on walking paths during winter.  Clean up any spills in your garage right away. This includes oil or grease spills. What can I do in the bathroom?  Use night lights.  Install grab bars by the toilet and in the tub and shower. Do not use towel bars as grab bars.  Use non-skid mats or decals in the tub or shower.  If you need to sit down in the shower, use a plastic, non-slip stool.  Keep the floor dry. Clean up any water that spills on the floor as soon as it happens.  Remove soap buildup in the tub or shower  regularly.  Attach bath mats securely with double-sided non-slip rug tape.  Do not have throw rugs and other things on the floor that can make you trip. What can I do in the bedroom?  Use night lights.  Make sure that you have a light by your bed that is easy to reach.  Do not use any sheets or blankets that are too big for your bed. They should not hang down onto the floor.  Have a firm chair that has side arms. You can use this for support while you get dressed.  Do not have throw rugs and other things on the floor that can make you trip. What can I do in the kitchen?  Clean up any spills right away.  Avoid walking on wet floors.  Keep items that you use a lot in easy-to-reach places.  If you need to reach something above you, use a strong step stool that has a grab bar.  Keep electrical cords out of the way.  Do not use floor polish or wax that makes floors slippery. If you must use wax, use non-skid floor wax.  Do not have throw rugs  and other things on the floor that can make you trip. What can I do with my stairs?  Do not leave any items on the stairs.  Make sure that there are handrails on both sides of the stairs and use them. Fix handrails that are broken or loose. Make sure that handrails are as long as the stairways.  Check any carpeting to make sure that it is firmly attached to the stairs. Fix any carpet that is loose or worn.  Avoid having throw rugs at the top or bottom of the stairs. If you do have throw rugs, attach them to the floor with carpet tape.  Make sure that you have a light switch at the top of the stairs and the bottom of the stairs. If you do not have them, ask someone to add them for you. What else can I do to help prevent falls?  Wear shoes that:  Do not have high heels.  Have rubber bottoms.  Are comfortable and fit you well.  Are closed at the toe. Do not wear sandals.  If you use a stepladder:  Make sure that it is fully  opened. Do not climb a closed stepladder.  Make sure that both sides of the stepladder are locked into place.  Ask someone to hold it for you, if possible.  Clearly mark and make sure that you can see:  Any grab bars or handrails.  First and last steps.  Where the edge of each step is.  Use tools that help you move around (mobility aids) if they are needed. These include:  Canes.  Walkers.  Scooters.  Crutches.  Turn on the lights when you go into a dark area. Replace any light bulbs as soon as they burn out.  Set up your furniture so you have a clear path. Avoid moving your furniture around.  If any of your floors are uneven, fix them.  If there are any pets around you, be aware of where they are.  Review your medicines with your doctor. Some medicines can make you feel dizzy. This can increase your chance of falling. Ask your doctor what other things that you can do to help prevent falls. This information is not intended to replace advice given to you by your health care provider. Make sure you discuss any questions you have with your health care provider. Document Released: 01/09/2009 Document Revised: 08/21/2015 Document Reviewed: 04/19/2014 Elsevier Interactive Patient Education  2017 [employer].

## 2019-05-23 DIAGNOSIS — R2689 Other abnormalities of gait and mobility: Secondary | ICD-10-CM | POA: Diagnosis not present

## 2019-05-23 DIAGNOSIS — R251 Tremor, unspecified: Secondary | ICD-10-CM | POA: Diagnosis not present

## 2019-05-23 DIAGNOSIS — E7800 Pure hypercholesterolemia, unspecified: Secondary | ICD-10-CM | POA: Diagnosis not present

## 2019-05-23 DIAGNOSIS — F418 Other specified anxiety disorders: Secondary | ICD-10-CM | POA: Diagnosis not present

## 2019-05-24 LAB — CBC WITH DIFFERENTIAL/PLATELET
Basophils Absolute: 0.1 10*3/uL (ref 0.0–0.2)
Basos: 2 %
EOS (ABSOLUTE): 0.1 10*3/uL (ref 0.0–0.4)
Eos: 3 %
Hematocrit: 45.2 % (ref 37.5–51.0)
Hemoglobin: 15.1 g/dL (ref 13.0–17.7)
Immature Grans (Abs): 0 10*3/uL (ref 0.0–0.1)
Immature Granulocytes: 0 %
Lymphocytes Absolute: 1.3 10*3/uL (ref 0.7–3.1)
Lymphs: 30 %
MCH: 28.1 pg (ref 26.6–33.0)
MCHC: 33.4 g/dL (ref 31.5–35.7)
MCV: 84 fL (ref 79–97)
Monocytes Absolute: 0.4 10*3/uL (ref 0.1–0.9)
Monocytes: 10 %
Neutrophils Absolute: 2.3 10*3/uL (ref 1.4–7.0)
Neutrophils: 55 %
Platelets: 199 10*3/uL (ref 150–450)
RBC: 5.37 x10E6/uL (ref 4.14–5.80)
RDW: 14.3 % (ref 11.6–15.4)
WBC: 4.2 10*3/uL (ref 3.4–10.8)

## 2019-05-24 LAB — COMPREHENSIVE METABOLIC PANEL
ALT: 24 IU/L (ref 0–44)
AST: 27 IU/L (ref 0–40)
Albumin/Globulin Ratio: 2.2 (ref 1.2–2.2)
Albumin: 4.3 g/dL (ref 3.7–4.7)
Alkaline Phosphatase: 110 IU/L (ref 39–117)
BUN/Creatinine Ratio: 14 (ref 10–24)
BUN: 14 mg/dL (ref 8–27)
Bilirubin Total: 0.6 mg/dL (ref 0.0–1.2)
CO2: 24 mmol/L (ref 20–29)
Calcium: 9.7 mg/dL (ref 8.6–10.2)
Chloride: 104 mmol/L (ref 96–106)
Creatinine, Ser: 1 mg/dL (ref 0.76–1.27)
GFR calc Af Amer: 84 mL/min/{1.73_m2} (ref >59)
GFR calc non Af Amer: 73 mL/min/{1.73_m2} (ref >59)
Globulin, Total: 2 g/dL (ref 1.5–4.5)
Glucose: 95 mg/dL (ref 65–99)
Potassium: 4 mmol/L (ref 3.5–5.2)
Sodium: 141 mmol/L (ref 134–144)
Total Protein: 6.3 g/dL (ref 6.0–8.5)

## 2019-05-24 LAB — LIPID PANEL WITH LDL/HDL RATIO
Cholesterol, Total: 135 mg/dL (ref 100–199)
HDL: 40 mg/dL (ref >39)
LDL Chol Calc (NIH): 71 mg/dL (ref 0–99)
LDL/HDL Ratio: 1.8 ratio (ref 0.0–3.6)
Triglycerides: 138 mg/dL (ref 0–149)
VLDL Cholesterol Cal: 24 mg/dL (ref 5–40)

## 2019-05-24 LAB — TSH: TSH: 1.65 u[IU]/mL (ref 0.450–4.500)

## 2019-05-25 ENCOUNTER — Encounter: Payer: Self-pay | Admitting: Family Medicine

## 2019-05-25 DIAGNOSIS — I739 Peripheral vascular disease, unspecified: Secondary | ICD-10-CM | POA: Insufficient documentation

## 2019-05-29 DIAGNOSIS — G5602 Carpal tunnel syndrome, left upper limb: Secondary | ICD-10-CM | POA: Diagnosis not present

## 2019-06-07 DIAGNOSIS — R262 Difficulty in walking, not elsewhere classified: Secondary | ICD-10-CM | POA: Diagnosis not present

## 2019-06-07 DIAGNOSIS — R202 Paresthesia of skin: Secondary | ICD-10-CM | POA: Diagnosis not present

## 2019-06-07 DIAGNOSIS — M21372 Foot drop, left foot: Secondary | ICD-10-CM | POA: Diagnosis not present

## 2019-06-07 DIAGNOSIS — R270 Ataxia, unspecified: Secondary | ICD-10-CM | POA: Diagnosis not present

## 2019-06-07 DIAGNOSIS — R200 Anesthesia of skin: Secondary | ICD-10-CM | POA: Diagnosis not present

## 2019-06-07 DIAGNOSIS — M21371 Foot drop, right foot: Secondary | ICD-10-CM | POA: Diagnosis not present

## 2019-06-07 DIAGNOSIS — R413 Other amnesia: Secondary | ICD-10-CM | POA: Diagnosis not present

## 2019-06-10 DIAGNOSIS — R262 Difficulty in walking, not elsewhere classified: Secondary | ICD-10-CM | POA: Insufficient documentation

## 2019-06-10 DIAGNOSIS — R413 Other amnesia: Secondary | ICD-10-CM | POA: Insufficient documentation

## 2019-06-10 DIAGNOSIS — R200 Anesthesia of skin: Secondary | ICD-10-CM | POA: Insufficient documentation

## 2019-06-10 DIAGNOSIS — R270 Ataxia, unspecified: Secondary | ICD-10-CM | POA: Insufficient documentation

## 2019-06-10 DIAGNOSIS — R202 Paresthesia of skin: Secondary | ICD-10-CM | POA: Insufficient documentation

## 2019-06-20 ENCOUNTER — Telehealth: Payer: Self-pay | Admitting: Family Medicine

## 2019-06-20 DIAGNOSIS — F419 Anxiety disorder, unspecified: Secondary | ICD-10-CM

## 2019-06-20 MED ORDER — BUSPIRONE HCL 15 MG PO TABS: 15.0000 mg | ORAL_TABLET | Freq: Two times a day (BID) | ORAL | 2 refills | Status: DC

## 2019-06-20 MED ORDER — FLUOXETINE HCL 20 MG PO CAPS: 20.0000 mg | ORAL_CAPSULE | Freq: Every day | ORAL | 3 refills | Status: DC

## 2019-06-20 MED ORDER — FAMOTIDINE 20 MG PO TABS: 20.0000 mg | ORAL_TABLET | Freq: Two times a day (BID) | ORAL | 2 refills | Status: DC

## 2019-06-20 NOTE — Telephone Encounter (Signed)
Refill request approved per protocol.   Patient wanted to send Simona Huh a message. Patient wants to know if Simona Huh has any other recommendations for balance and neuropathy in hands and feet. He states he hasn't seen any improvement since seeing the Neurologist. Patient is aware that Simona Huh is out of the office this week. Patient prefers to wait until Simona Huh returns for a recommendation.

## 2019-06-20 NOTE — Telephone Encounter (Signed)
[facility] faxed refill request for the following medications:  busPIRone (BUSPAR) 15 MG tablet famotidine (PEPCID) 20 MG tablet FLUoxetine (PROZAC) 20 MG capsule  Please advise.  Thanks, [employer]

## 2019-06-22 NOTE — Telephone Encounter (Signed)
Discussed treatment regimen advised by Dr. [name] for exercises to help balance and medication adjustments for memory issues with mood irritability. Will continue this program and monitor progress. He agreed to proceed with the plan and follow up.

## 2019-06-28 ENCOUNTER — Other Ambulatory Visit: Payer: Self-pay

## 2019-06-28 ENCOUNTER — Encounter: Payer: Self-pay | Admitting: Family Medicine

## 2019-06-28 ENCOUNTER — Ambulatory Visit (INDEPENDENT_AMBULATORY_CARE_PROVIDER_SITE_OTHER): Payer: Medicare HMO | Admitting: Family Medicine

## 2019-06-28 DIAGNOSIS — J45909 Unspecified asthma, uncomplicated: Secondary | ICD-10-CM

## 2019-06-28 MED ORDER — PREDNISONE 10 MG PO TABS: ORAL_TABLET | ORAL | 0 refills | Status: DC

## 2019-06-28 MED ORDER — AZITHROMYCIN 250 MG PO TABS: ORAL_TABLET | ORAL | 0 refills | Status: DC

## 2019-06-28 NOTE — Progress Notes (Signed)
Patient: Justin Warren Male    DOB: Jun 12, 1945   73 y.o.   MRN: [id number] Visit Date: 06/28/2019  Today's Provider: Vernie Murders, PA   Chief Complaint  Patient presents with  . Cough   Subjective:    Virtual Visit via Telephone Note  I connected with Justin Warren on 06/28/19 at  1:20 PM EDT by telephone and verified that I am speaking with the correct person using two identifiers.  Location: Patient: Home Provider: Office   I discussed the limitations, risks, security and privacy concerns of performing an evaluation and management service by telephone and the availability of in person appointments. I also discussed with the patient that there may be a patient responsible charge related to this service. The patient expressed understanding and agreed to proceed.   Sinus Problem This is a new problem. The current episode started in the past 7 days. The problem has been gradually worsening since onset. There has been no fever. Associated symptoms include congestion, coughing, headaches, a hoarse voice and sneezing. Pertinent negatives include no chills, ear pain, shortness of breath, sinus pressure, sore throat or swollen glands. (Also coughing up green phlegm ) Past treatments include oral decongestants (Zyrtec D). The treatment provided no relief.    Allergies  Allergen Reactions  . Amoxicillin-Pot Clavulanate Nausea And Vomiting    DID THE REACTION INVOLVE: Swelling of the face/tongue/throat, SOB, or low BP? No Sudden or severe rash/hives, skin peeling, or the inside of the mouth or nose? No Did it require medical treatment? No When did it last happen?Over 10 years ago If all above answers are "NO", may proceed with cephalosporin use.     Current Outpatient Medications:  .  acetaminophen (TYLENOL) 650 MG CR tablet, Take 650 mg by mouth every 8 (eight) hours as needed for pain., Disp: , Rfl:  .  Apple Cider Vinegar 500 MG TABS, Take by mouth 2 (two) times daily.,  Disp: , Rfl:  .  aspirin 81 MG EC tablet, Take 81 mg by mouth daily. Swallow whole., Disp: , Rfl:  .  busPIRone (BUSPAR) 15 MG tablet, Take 1 tablet (15 mg total) by mouth 2 (two) times daily., Disp: 180 tablet, Rfl: 2 .  famotidine (PEPCID) 20 MG tablet, Take 1 tablet (20 mg total) by mouth 2 (two) times daily., Disp: 180 tablet, Rfl: 2 .  FLUoxetine (PROZAC) 20 MG capsule, Take 1 capsule (20 mg total) by mouth daily., Disp: 90 capsule, Rfl: 3 .  gabapentin (NEURONTIN) 300 MG capsule, Take 300 mg by mouth 2 (two) times daily. , Disp: , Rfl:  .  meloxicam (MOBIC) 15 MG tablet, Take 15 mg by mouth daily., Disp: , Rfl:  .  simvastatin (ZOCOR) 20 MG tablet, Take 1 tablet (20 mg total) by mouth daily., Disp: 90 tablet, Rfl: 3  Review of Systems  Constitutional: Negative for chills.  HENT: Positive for congestion, hoarse voice and sneezing. Negative for ear pain, sinus pressure and sore throat.   Respiratory: Positive for cough. Negative for shortness of breath.   Neurological: Positive for headaches.   Past Medical History:  Diagnosis Date  . GERD (gastroesophageal reflux disease)   . Hyperlipidemia    Past Surgical History:  Procedure Laterality Date  . APPENDECTOMY  1994  . CARPAL TUNNEL RELEASE  2012  . LUMBAR LAMINECTOMIES  1976 & 1978   Dr. [name]  . PACEMAKER INSERTION Left 04/20/2018   Procedure: INSERTION PACEMAKER-DUAL CHAMBER;  Surgeon: Saralyn Pilar,  Sheppard Coil, MD;  Location: [facility] ORS;  Service: Cardiovascular;  Laterality: Left;  . REPLACEMENT TOTAL KNEE    . SIGMOIDOSCOPY  2010   Family History  Problem Relation Age of Onset  . Dementia Mother   . GER disease Mother   . Hypertension Father   . Arrhythmia Sister   . Heart murmur Sister     Social History   Tobacco Use  . Smoking status: Former Smoker    Packs/day: 2.00    Years: 30.00    Pack years: 60.00    Types: Cigarettes  . Smokeless tobacco: Never Used  . Tobacco comment: quit in 1991-1992  Substance Use  Topics  . Alcohol use: Not Currently    Alcohol/week: 0.0 standard drinks    Comment: quit over 30 years ago      Objective:   There were no vitals taken for this visit. There were no vitals filed for this visit.There is no height or weight on file to calculate BMI.   Physical Exam: Slight hoarseness during telephonic interview. No dyspnea or significant cough.     Assessment & Plan     1. Bronchitis, allergic, unspecified asthma severity, uncomplicated Developed some cough, chest aches, greenish sputum and PND production with throat irritation in the mornings over the past 7 days. No fever or wheezing. Felt it was his allergies flaring after mowing. Taking Zyrtec-D with minimal relief. No loss of taste or smell. Cautioned about COVID symptoms and may need to be tested if any new symptoms develop or symptoms no better with treatment. Denies exposure to COVID-19. Will treat with Z-pak and Prednisone taper. May add Mucinex-DM to the Zyrtec-D and recheck if no better in 3-4 days. - azithromycin (ZITHROMAX) 250 MG tablet; Take 2 tablets by mouth today then 1 daily for 4 days.  Dispense: 6 tablet; Refill: 0 - predniSONE (DELTASONE) 10 MG tablet; Take 6 tablets by mouth divided among meals and bedtime day 1, 5 day 2, 4 day 3, 3 day 4, 2 day 5 and 1 day 6.  Dispense: 21 tablet; Refill: 0   I discussed the assessment and treatment plan with the patient. The patient was provided an opportunity to ask questions and all were answered. The patient agreed with the plan and demonstrated an understanding of the instructions.   The patient was advised to call back or seek an in-person evaluation if the symptoms worsen or if the condition fails to improve as anticipated.  I provided 15 minutes of non-face-to-face time during this encounter.      Vernie Murders, PA  [facility]

## 2019-06-29 ENCOUNTER — Ambulatory Visit: Payer: Medicare HMO | Admitting: Family Medicine

## 2019-07-10 DIAGNOSIS — I442 Atrioventricular block, complete: Secondary | ICD-10-CM | POA: Diagnosis not present

## 2019-08-28 DIAGNOSIS — R270 Ataxia, unspecified: Secondary | ICD-10-CM | POA: Diagnosis not present

## 2019-08-28 DIAGNOSIS — M21371 Foot drop, right foot: Secondary | ICD-10-CM | POA: Diagnosis not present

## 2019-08-28 DIAGNOSIS — M21372 Foot drop, left foot: Secondary | ICD-10-CM | POA: Diagnosis not present

## 2019-08-28 DIAGNOSIS — R262 Difficulty in walking, not elsewhere classified: Secondary | ICD-10-CM | POA: Diagnosis not present

## 2019-08-28 DIAGNOSIS — R202 Paresthesia of skin: Secondary | ICD-10-CM | POA: Diagnosis not present

## 2019-08-28 DIAGNOSIS — R413 Other amnesia: Secondary | ICD-10-CM | POA: Diagnosis not present

## 2019-08-28 DIAGNOSIS — R200 Anesthesia of skin: Secondary | ICD-10-CM | POA: Diagnosis not present

## 2019-09-24 ENCOUNTER — Ambulatory Visit (INDEPENDENT_AMBULATORY_CARE_PROVIDER_SITE_OTHER): Payer: Medicare HMO | Admitting: Physician Assistant

## 2019-09-24 ENCOUNTER — Encounter: Payer: Self-pay | Admitting: Physician Assistant

## 2019-09-24 ENCOUNTER — Other Ambulatory Visit: Payer: Self-pay

## 2019-09-24 VITALS — BP 150/81 | HR 65 | Temp 97.1°F | Resp 16 | Wt 195.6 lb

## 2019-09-24 DIAGNOSIS — L299 Pruritus, unspecified: Secondary | ICD-10-CM | POA: Diagnosis not present

## 2019-09-24 DIAGNOSIS — M5416 Radiculopathy, lumbar region: Secondary | ICD-10-CM

## 2019-09-24 DIAGNOSIS — H9193 Unspecified hearing loss, bilateral: Secondary | ICD-10-CM | POA: Diagnosis not present

## 2019-09-24 MED ORDER — GABAPENTIN 300 MG PO CAPS: 300.0000 mg | ORAL_CAPSULE | Freq: Two times a day (BID) | ORAL | 1 refills | Status: DC

## 2019-09-24 MED ORDER — FLUOCINOLONE ACETONIDE 0.01 % OT OIL: TOPICAL_OIL | OTIC | 0 refills | Status: DC

## 2019-09-24 NOTE — Progress Notes (Signed)
Established patient visit   Patient: Justin Warren   DOB: [birth year]   74 y.o. Male  MRN: [id number] Visit Date: 09/24/2019  Today's healthcare provider: Mar Daring, PA-C   Chief Complaint  Patient presents with   Ear Fullness    itching   Subjective    HPI Patient here with c/o ear itching with hearing aids.  Patient reports that he may need the Gabapentin refill. Reports that he has been using the Meloxicam thinking it was to help for the Neuropathy but has not taken the Gabapentin for a while.  Patient Active Problem List   Diagnosis Date Noted   PAD (peripheral artery disease) ([place]) 05/25/2019   CHB (complete heart block) ([place]) 04/20/2018   Foot drop, bilateral 05/02/2017   Lumbar back pain with radiculopathy affecting right lower extremity 04/09/2016   Hx of decompressive lumbar laminectomy 04/09/2016   Anxiety 03/07/2015   Clinical depression 03/07/2015   Difficulty hearing 03/07/2015   Hypercholesteremia 03/07/2015   Arthritis, degenerative 03/07/2015   Malignant neoplasm of skin 03/07/2015   Acid reflux 06/26/2009   External hemorrhoids without complication 39/76/7341   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   Apple Cider Vinegar 500 MG TABS Take by mouth 2 (two) times daily.   aspirin 81 MG EC tablet Take 81 mg by mouth daily. Swallow whole.   busPIRone (BUSPAR) 15 MG tablet Take 1 tablet (15 mg total) by mouth 2 (two) times daily.   donepezil (ARICEPT) 5 MG tablet    famotidine (PEPCID) 20 MG tablet Take 1 tablet (20 mg total) by mouth 2 (two) times daily.   FLUoxetine (PROZAC) 20 MG capsule Take 1 capsule (20 mg total) by mouth daily.    meloxicam (MOBIC) 15 MG tablet Take 15 mg by mouth daily.   simvastatin (ZOCOR) 20 MG tablet Take 1 tablet (20 mg total) by mouth daily.   azithromycin (ZITHROMAX) 250 MG tablet Take 2 tablets by mouth today then 1 daily for 4 days.   gabapentin (NEURONTIN) 300 MG capsule Take 300 mg by mouth 2 (two) times daily.  (Patient not taking: Reported on 09/24/2019)   predniSONE (DELTASONE) 10 MG tablet Take 6 tablets by mouth divided among meals and bedtime day 1, 5 day 2, 4 day 3, 3 day 4, 2 day 5 and 1 day 6.   No facility-administered medications prior to visit.    Review of Systems  Constitutional: Negative.   HENT: Positive for hearing loss. Negative for ear discharge and ear pain.        Ear itch  Respiratory: Negative.   Cardiovascular: Negative.   Neurological: Negative for dizziness and light-headedness.    Last CBC Lab Results  Component Value Date   WBC 4.2 05/23/2019   HGB 15.1 05/23/2019   HCT 45.2 05/23/2019   MCV 84 05/23/2019   MCH 28.1 05/23/2019   RDW 14.3 05/23/2019   PLT 199 93/79/0240   Last metabolic panel Lab Results  Component Value Date   GLUCOSE 95 05/23/2019   NA 141 05/23/2019   K 4.0 05/23/2019   CL 104 05/23/2019   CO2 24 05/23/2019   BUN 14 05/23/2019   CREATININE  1.00 05/23/2019   GFRNONAA 73 05/23/2019   GFRAA 84 05/23/2019   CALCIUM 9.7 05/23/2019   PROT 6.3 05/23/2019   ALBUMIN 4.3 05/23/2019   LABGLOB 2.0 05/23/2019   AGRATIO 2.2 05/23/2019   BILITOT 0.6 05/23/2019   ALKPHOS 110 05/23/2019   AST 27 05/23/2019   ALT 24 05/23/2019   ANIONGAP 6 04/14/2018   Last lipids Lab Results  Component Value Date   CHOL 135 05/23/2019   HDL 40 05/23/2019   LDLCALC 71 05/23/2019   TRIG 138 05/23/2019   CHOLHDL 4.4 11/23/2018      Objective    BP (!) 150/81 (BP Location: Right Arm, Patient Position: Sitting, Cuff Size: Large)    Pulse 65    Temp (!) 97.1 F (36.2 C) (Temporal)    Resp 16    Wt 195 lb 9.6 oz (88.7 kg)    BMI 30.64 kg/m    BP Readings from Last 3 Encounters:  09/24/19 (!) 150/81  05/21/19 128/60  05/21/19 128/60   Wt Readings from Last 3 Encounters:  09/24/19 195 lb 9.6 oz (88.7 kg)  05/21/19 199 lb (90.3 kg)  05/21/19 199 lb 9.6 oz (90.5 kg)      Physical Exam Vitals reviewed.  Constitutional:      General: He is not in acute distress.    Appearance: Normal appearance. He is well-developed. He is obese. He is not ill-appearing or diaphoretic.  HENT:     Head: Normocephalic and atraumatic.     Right Ear: Hearing, ear canal and external ear normal. No middle ear effusion. There is no impacted cerumen. Tympanic membrane is scarred. Tympanic membrane is not erythematous or bulging.     Left Ear: Hearing, ear canal and external ear normal.  No middle ear effusion. There is no impacted cerumen. Tympanic membrane is scarred. Tympanic membrane is not erythematous or bulging.     Nose: No mucosal edema, congestion or rhinorrhea.     Right Sinus: No maxillary sinus tenderness or frontal sinus tenderness.     Left Sinus: No maxillary sinus tenderness or frontal sinus tenderness.     Mouth/Throat:     Mouth: Mucous membranes are moist.     Pharynx: Oropharynx is clear. Uvula midline. No oropharyngeal exudate or posterior oropharyngeal erythema.  Eyes:     General:        Right eye: No discharge.        Left eye: No discharge.     Conjunctiva/sclera: Conjunctivae normal.     Pupils: Pupils are equal, round, and reactive to light.  Neck:     Thyroid: No thyromegaly.     Trachea: No tracheal deviation.     Meningeal: Brudzinski's sign and Kernig's sign absent.  Cardiovascular:     Rate and Rhythm: Normal rate and regular rhythm.     Heart sounds: Normal heart sounds. No murmur heard.  No friction rub. No gallop.   Pulmonary:     Effort: Pulmonary effort is normal. No respiratory distress.     Breath sounds: Normal breath sounds. No stridor. No wheezing or rales.  Musculoskeletal:     Cervical back:  Normal range of motion and neck supple.  Lymphadenopathy:     Cervical: No cervical adenopathy.  Skin:    General: Skin is warm and dry.  Neurological:     Mental Status: He is alert.      No results found for any visits on 09/24/19.  Assessment & Plan  1. Hearing difficulty of both ears Has hearing aids but cause itching and he does not like wearing them.  2. 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 Diagnosis pulled for medication refill. Continue current medical treatment plan. - gabapentin (NEURONTIN) 300 MG capsule; Take 1 capsule (300 mg total) by mouth 2 (two) times daily.  Dispense: 180 capsule; Refill: 1  3. Ear itch Mild erythema in the left canal. Will try fluocinolone otic oil as below to see if that helps the itching at all as there may be a dryness/eczematous component for the itching. Try drops for 5 days. Do not use hearing aids while using drops.  - Fluocinolone Acetonide 0.01 % OIL; Place 3-4 drops in each ear twice daily x 5 days  Dispense: 20 mL; Refill: 0   No follow-ups on file.      Reynolds Bowl, PA-C, have reviewed all documentation for this visit. The documentation on 09/25/19 for the exam, diagnosis, procedures, and orders are all accurate and complete.   Rubye Beach  [facility] [phone] (phone) [phone] (fax)  [place]

## 2019-09-24 NOTE — Patient Instructions (Signed)

## 2019-09-26 ENCOUNTER — Telehealth: Payer: Self-pay | Admitting: Family Medicine

## 2019-09-26 DIAGNOSIS — L299 Pruritus, unspecified: Secondary | ICD-10-CM

## 2019-09-26 NOTE — Telephone Encounter (Signed)
This is not our patient. Patient sees Dr. Ethelene Browns at [facility].

## 2019-09-26 NOTE — Telephone Encounter (Signed)
Copied from [place] [id number]. Topic: General - Inquiry >> Sep 26, 2019  4:47 AM Doyce Loose D wrote: Reason for CRM:PT called and stated that the price of the ear drops where to expensive and wanted to know if there is a generic brand that he can purchase, he stated that he will not pay $97.00 for the drps. Pt was seen on 06/28 for this issue

## 2019-09-27 MED ORDER — NEOMYCIN-POLYMYXIN-HC 3.5-10000-1 OP SUSP: OPHTHALMIC | 0 refills | Status: DC

## 2019-09-27 NOTE — Telephone Encounter (Signed)
With what you saw in his ears, do you thing trying Cortisporin Otic would be a less expensive option?

## 2019-09-27 NOTE — Telephone Encounter (Signed)
LMTCB-If patient calls back ok for [facility] to give message that provider changed the ear drops to Cortisporin.

## 2019-09-27 NOTE — Telephone Encounter (Signed)
Changed to cortisporin

## 2019-09-27 NOTE — Telephone Encounter (Signed)
Informed patient of change to Cortisporin for his ears.

## 2019-09-27 NOTE — Addendum Note (Signed)
Addended by: Mar Daring on: 09/27/2019 08:44 AM   Modules accepted: Orders

## 2019-10-08 ENCOUNTER — Telehealth: Payer: Self-pay | Admitting: Family Medicine

## 2019-10-08 NOTE — Telephone Encounter (Signed)
Pt called and is requesting to have a prescription for a mobility scooter due to his neuropathy. Pt is requesting to have PCP get in contact with Dr. [name] to get this taken care of. Pt states that he is only able to walk 50-60 ft before he has to sit down. Please advise.

## 2019-10-08 NOTE — Telephone Encounter (Signed)
Dr. [name] should be able to get this for him without me delaying the process by getting in the middle. Be sure he checks with his insurance to see if they require a physical therapy mobility assessment to get it covered.

## 2019-10-09 NOTE — Telephone Encounter (Signed)
Called to advise patient as below. No answer, LVMTCB.

## 2019-10-09 NOTE — Telephone Encounter (Signed)
Pt called back in , pt advise and expressed understanding

## 2019-10-10 DIAGNOSIS — Z85828 Personal history of other malignant neoplasm of skin: Secondary | ICD-10-CM | POA: Diagnosis not present

## 2019-10-10 DIAGNOSIS — X32XXXA Exposure to sunlight, initial encounter: Secondary | ICD-10-CM | POA: Diagnosis not present

## 2019-10-10 DIAGNOSIS — L821 Other seborrheic keratosis: Secondary | ICD-10-CM | POA: Diagnosis not present

## 2019-10-10 DIAGNOSIS — D2262 Melanocytic nevi of left upper limb, including shoulder: Secondary | ICD-10-CM | POA: Diagnosis not present

## 2019-10-10 DIAGNOSIS — L570 Actinic keratosis: Secondary | ICD-10-CM | POA: Diagnosis not present

## 2019-10-10 DIAGNOSIS — D2272 Melanocytic nevi of left lower limb, including hip: Secondary | ICD-10-CM | POA: Diagnosis not present

## 2019-10-10 DIAGNOSIS — D2261 Melanocytic nevi of right upper limb, including shoulder: Secondary | ICD-10-CM | POA: Diagnosis not present

## 2019-10-10 DIAGNOSIS — D225 Melanocytic nevi of trunk: Secondary | ICD-10-CM | POA: Diagnosis not present

## 2019-10-11 DIAGNOSIS — R69 Illness, unspecified: Secondary | ICD-10-CM | POA: Diagnosis not present

## 2019-10-15 DIAGNOSIS — R262 Difficulty in walking, not elsewhere classified: Secondary | ICD-10-CM | POA: Diagnosis not present

## 2019-10-15 DIAGNOSIS — M21371 Foot drop, right foot: Secondary | ICD-10-CM | POA: Diagnosis not present

## 2019-10-24 DIAGNOSIS — R001 Bradycardia, unspecified: Secondary | ICD-10-CM | POA: Diagnosis not present

## 2019-10-24 DIAGNOSIS — R413 Other amnesia: Secondary | ICD-10-CM | POA: Diagnosis not present

## 2019-10-24 DIAGNOSIS — E785 Hyperlipidemia, unspecified: Secondary | ICD-10-CM | POA: Diagnosis not present

## 2019-10-24 DIAGNOSIS — I442 Atrioventricular block, complete: Secondary | ICD-10-CM | POA: Diagnosis not present

## 2019-10-24 DIAGNOSIS — I4589 Other specified conduction disorders: Secondary | ICD-10-CM | POA: Diagnosis not present

## 2019-10-24 DIAGNOSIS — I739 Peripheral vascular disease, unspecified: Secondary | ICD-10-CM | POA: Diagnosis not present

## 2019-10-24 DIAGNOSIS — R0789 Other chest pain: Secondary | ICD-10-CM | POA: Diagnosis not present

## 2019-10-30 DIAGNOSIS — R413 Other amnesia: Secondary | ICD-10-CM | POA: Diagnosis not present

## 2019-10-30 DIAGNOSIS — R202 Paresthesia of skin: Secondary | ICD-10-CM | POA: Diagnosis not present

## 2019-10-30 DIAGNOSIS — M21372 Foot drop, left foot: Secondary | ICD-10-CM | POA: Diagnosis not present

## 2019-10-30 DIAGNOSIS — R262 Difficulty in walking, not elsewhere classified: Secondary | ICD-10-CM | POA: Diagnosis not present

## 2019-10-30 DIAGNOSIS — M21371 Foot drop, right foot: Secondary | ICD-10-CM | POA: Diagnosis not present

## 2019-10-30 DIAGNOSIS — R200 Anesthesia of skin: Secondary | ICD-10-CM | POA: Diagnosis not present

## 2019-10-30 DIAGNOSIS — R270 Ataxia, unspecified: Secondary | ICD-10-CM | POA: Diagnosis not present

## 2019-11-05 DIAGNOSIS — S66912A Strain of unspecified muscle, fascia and tendon at wrist and hand level, left hand, initial encounter: Secondary | ICD-10-CM | POA: Diagnosis not present

## 2019-11-13 DIAGNOSIS — G5602 Carpal tunnel syndrome, left upper limb: Secondary | ICD-10-CM | POA: Diagnosis not present

## 2019-11-16 DIAGNOSIS — G5602 Carpal tunnel syndrome, left upper limb: Secondary | ICD-10-CM | POA: Diagnosis not present

## 2019-11-24 ENCOUNTER — Other Ambulatory Visit: Payer: Self-pay

## 2019-11-24 ENCOUNTER — Emergency Department: Payer: Medicare HMO

## 2019-11-24 ENCOUNTER — Emergency Department
Admission: EM | Admit: 2019-11-24 | Discharge: 2019-11-24 | Disposition: A | Discharge status: Home / Self Care | Payer: Medicare HMO | Attending: Emergency Medicine | Admitting: Emergency Medicine

## 2019-11-24 ENCOUNTER — Encounter: Payer: Self-pay | Admitting: Radiology

## 2019-11-24 DIAGNOSIS — I701 Atherosclerosis of renal artery: Secondary | ICD-10-CM | POA: Diagnosis not present

## 2019-11-24 DIAGNOSIS — Z87891 Personal history of nicotine dependence: Secondary | ICD-10-CM | POA: Diagnosis not present

## 2019-11-24 DIAGNOSIS — R1084 Generalized abdominal pain: Secondary | ICD-10-CM | POA: Diagnosis not present

## 2019-11-24 DIAGNOSIS — K8020 Calculus of gallbladder without cholecystitis without obstruction: Secondary | ICD-10-CM | POA: Diagnosis not present

## 2019-11-24 DIAGNOSIS — N132 Hydronephrosis with renal and ureteral calculous obstruction: Secondary | ICD-10-CM | POA: Diagnosis not present

## 2019-11-24 DIAGNOSIS — Z79899 Other long term (current) drug therapy: Secondary | ICD-10-CM | POA: Insufficient documentation

## 2019-11-24 DIAGNOSIS — N201 Calculus of ureter: Secondary | ICD-10-CM | POA: Diagnosis not present

## 2019-11-24 DIAGNOSIS — Z7982 Long term (current) use of aspirin: Secondary | ICD-10-CM | POA: Insufficient documentation

## 2019-11-24 DIAGNOSIS — R109 Unspecified abdominal pain: Secondary | ICD-10-CM | POA: Diagnosis present

## 2019-11-24 DIAGNOSIS — R52 Pain, unspecified: Secondary | ICD-10-CM | POA: Diagnosis not present

## 2019-11-24 DIAGNOSIS — R112 Nausea with vomiting, unspecified: Secondary | ICD-10-CM | POA: Diagnosis not present

## 2019-11-24 DIAGNOSIS — I10 Essential (primary) hypertension: Secondary | ICD-10-CM | POA: Diagnosis not present

## 2019-11-24 DIAGNOSIS — Z96659 Presence of unspecified artificial knee joint: Secondary | ICD-10-CM | POA: Diagnosis not present

## 2019-11-24 DIAGNOSIS — N200 Calculus of kidney: Secondary | ICD-10-CM | POA: Diagnosis not present

## 2019-11-24 DIAGNOSIS — Z950 Presence of cardiac pacemaker: Secondary | ICD-10-CM | POA: Diagnosis not present

## 2019-11-24 DIAGNOSIS — E785 Hyperlipidemia, unspecified: Secondary | ICD-10-CM | POA: Diagnosis not present

## 2019-11-24 DIAGNOSIS — I700 Atherosclerosis of aorta: Secondary | ICD-10-CM | POA: Diagnosis not present

## 2019-11-24 LAB — CBC WITH DIFFERENTIAL/PLATELET
Abs Immature Granulocytes: 0.02 10*3/uL (ref 0.00–0.07)
Basophils Absolute: 0.1 10*3/uL (ref 0.0–0.1)
Basophils Relative: 1 %
Eosinophils Absolute: 0.1 10*3/uL (ref 0.0–0.5)
Eosinophils Relative: 1 %
HCT: 44.5 % (ref 39.0–52.0)
Hemoglobin: 15.7 g/dL (ref 13.0–17.0)
Immature Granulocytes: 0 %
Lymphocytes Relative: 19 %
Lymphs Abs: 1.7 10*3/uL (ref 0.7–4.0)
MCH: 29.1 pg (ref 26.0–34.0)
MCHC: 35.3 g/dL (ref 30.0–36.0)
MCV: 82.6 fL (ref 80.0–100.0)
Monocytes Absolute: 0.6 10*3/uL (ref 0.1–1.0)
Monocytes Relative: 6 %
Neutro Abs: 6.4 10*3/uL (ref 1.7–7.7)
Neutrophils Relative %: 73 %
Platelets: 258 10*3/uL (ref 150–400)
RBC: 5.39 MIL/uL (ref 4.22–5.81)
RDW: 13.2 % (ref 11.5–15.5)
WBC: 8.9 10*3/uL (ref 4.0–10.5)
nRBC: 0 % (ref 0.0–0.2)

## 2019-11-24 LAB — URINALYSIS, COMPLETE (UACMP) WITH MICROSCOPIC
Bacteria, UA: NONE SEEN
Bilirubin Urine: NEGATIVE
Glucose, UA: NEGATIVE mg/dL
Ketones, ur: 20 mg/dL — AB
Leukocytes,Ua: NEGATIVE
Nitrite: NEGATIVE
Protein, ur: NEGATIVE mg/dL
Specific Gravity, Urine: >1.046 — ABNORMAL HIGH (ref 1.005–1.030)
pH: 5 (ref 5.0–8.0)

## 2019-11-24 LAB — HEPATIC FUNCTION PANEL
ALT: 23 U/L (ref 0–44)
AST: 27 U/L (ref 15–41)
Albumin: 4.4 g/dL (ref 3.5–5.0)
Alkaline Phosphatase: 82 U/L (ref 38–126)
Bilirubin, Direct: 0.2 mg/dL (ref 0.0–0.2)
Indirect Bilirubin: 1 mg/dL — ABNORMAL HIGH (ref 0.3–0.9)
Total Bilirubin: 1.2 mg/dL (ref 0.3–1.2)
Total Protein: 7.1 g/dL (ref 6.5–8.1)

## 2019-11-24 LAB — BASIC METABOLIC PANEL
Anion gap: 11 (ref 5–15)
BUN: 23 mg/dL (ref 8–23)
CO2: 25 mmol/L (ref 22–32)
Calcium: 9.3 mg/dL (ref 8.9–10.3)
Chloride: 98 mmol/L (ref 98–111)
Creatinine, Ser: 1.08 mg/dL (ref 0.61–1.24)
GFR calc Af Amer: >60 mL/min (ref >60)
GFR calc non Af Amer: >60 mL/min (ref >60)
Glucose, Bld: 142 mg/dL — ABNORMAL HIGH (ref 70–99)
Potassium: 3.2 mmol/L — ABNORMAL LOW (ref 3.5–5.1)
Sodium: 134 mmol/L — ABNORMAL LOW (ref 135–145)

## 2019-11-24 LAB — LACTIC ACID, PLASMA
Lactic Acid, Venous: 2.1 mmol/L (ref 0.5–1.9)
Lactic Acid, Venous: 1.4 mmol/L (ref 0.5–1.9)

## 2019-11-24 LAB — TROPONIN I (HIGH SENSITIVITY): Troponin I (High Sensitivity): 9 ng/L (ref <18)

## 2019-11-24 LAB — LIPASE, BLOOD: Lipase: 34 U/L (ref 11–51)

## 2019-11-24 MED ORDER — SODIUM CHLORIDE 0.9 % IV BOLUS
500.0000 mL | Freq: Once | INTRAVENOUS | Status: AC
  Administered 2019-11-24: 500 mL via INTRAVENOUS

## 2019-11-24 MED ORDER — IOHEXOL 350 MG/ML SOLN
100.0000 mL | Freq: Once | INTRAVENOUS | Status: AC | PRN
  Administered 2019-11-24: 100 mL via INTRAVENOUS

## 2019-11-24 MED ORDER — MORPHINE SULFATE (PF) 4 MG/ML IV SOLN
4.0000 mg | Freq: Once | INTRAVENOUS | Status: AC
  Administered 2019-11-24: 4 mg via INTRAVENOUS
  Filled 2019-11-24: qty 1

## 2019-11-24 MED ORDER — TAMSULOSIN HCL 0.4 MG PO CAPS: 0.4000 mg | ORAL_CAPSULE | Freq: Every day | ORAL | 0 refills | Status: DC

## 2019-11-24 MED ORDER — ONDANSETRON HCL 4 MG/2ML IJ SOLN
4.0000 mg | Freq: Once | INTRAMUSCULAR | Status: AC
  Administered 2019-11-24: 4 mg via INTRAVENOUS
  Filled 2019-11-24: qty 2

## 2019-11-24 MED ORDER — HYDROMORPHONE HCL 1 MG/ML IJ SOLN
1.0000 mg | Freq: Once | INTRAMUSCULAR | Status: AC
  Administered 2019-11-24: 1 mg via INTRAVENOUS
  Filled 2019-11-24: qty 1

## 2019-11-24 MED ORDER — OXYCODONE-ACETAMINOPHEN 5-325 MG PO TABS: 1.0000 | ORAL_TABLET | Freq: Four times a day (QID) | ORAL | 0 refills | Status: AC | PRN

## 2019-11-24 NOTE — Discharge Instructions (Addendum)
Take the Flomax once daily until you follow-up with the urologist.  You may take the Percocet as needed for pain.  You should follow-up with a urologist this week.  Dr. Doristine Counter office will call you on Monday to schedule an appointment for Monday or Tuesday.  If you do not hear from them by late Monday, you can call at the number provided.  Return to the ER immediately for new, worsening, or persistent severe pain, vomiting, fever, weakness, inability urinate, or any other new or worsening symptoms that concern you.

## 2019-11-24 NOTE — ED Provider Notes (Signed)
[facility] Emergency Department Provider Note ____________________________________________   First MD Initiated Contact with Patient 11/24/19 1626     (approximate)  I have reviewed the triage vital signs and the nursing notes.   HISTORY  Chief Complaint Flank Pain    HPI Justin Warren is a 74 y.o. male with PMH as noted below who presents with right flank pain, acute onset few hours ago, persistent course since then, and associated with nausea and one episode of vomiting. The patient states that the pain radiates towards his right lower abdomen. Nothing makes it better or worse. He denies any associated urinary symptoms or any acute change in his bowel movements, although he states he has had somewhat loose bowel movements over the last few weeks. He has had no prior history of this pain.   Past Medical History:  Diagnosis Date  . GERD (gastroesophageal reflux disease)   . Hyperlipidemia     Patient Active Problem List   Diagnosis Date Noted  . PAD (peripheral artery disease) ([place]) 05/25/2019  . CHB (complete heart block) ([place]) 04/20/2018  . Foot drop, bilateral 05/02/2017  . 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 Hx of decompressive lumbar laminectomy 04/09/2016  . Anxiety 03/07/2015  . Clinical depression 03/07/2015  . Difficulty hearing 03/07/2015  . Hypercholesteremia 03/07/2015  . 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 Acid reflux 06/26/2009  . External hemorrhoids without complication [id number]/[id number]  . 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 APPENDECTOMY  1994  . CARPAL TUNNEL RELEASE  2012  . LUMBAR LAMINECTOMIES  1976 & 1978   Dr. [name]  . PACEMAKER INSERTION Left 04/20/2018   Procedure: INSERTION PACEMAKER-DUAL CHAMBER;  Surgeon: [name], MD;  Location: [facility] ORS;  Service: Cardiovascular;   Laterality: Left;  . REPLACEMENT TOTAL KNEE    . SIGMOIDOSCOPY  2010    Prior to Admission medications   Medication Sig Start Date End Date Taking? Authorizing Provider  acetaminophen (TYLENOL) 650 MG CR tablet Take 650 mg by mouth every 8 (eight) hours as needed for pain.    [provider]  Apple Cider Vinegar 500 MG TABS Take by mouth 2 (two) times daily.    [provider]  aspirin 81 MG EC tablet Take 81 mg by mouth daily. Swallow whole.    [provider]  busPIRone (BUSPAR) 15 MG tablet Take 1 tablet (15 mg total) by mouth 2 (two) times daily. 06/20/19   Chrismon, Vickki Muff, PA  donepezil (ARICEPT) 5 MG tablet  08/29/19   [provider]  famotidine (PEPCID) 20 MG tablet Take 1 tablet (20 mg total) by mouth 2 (two) times daily. 06/20/19   Chrismon, Vickki Muff, PA  FLUoxetine (PROZAC) 20 MG capsule Take 1 capsule (20 mg total) by mouth daily. 06/20/19   Chrismon, Vickki Muff, PA  gabapentin (NEURONTIN) 300 MG capsule Take 1 capsule (300 mg total) by mouth 2 (two) times daily. 09/24/19   Mar Daring, PA-C  meloxicam (MOBIC) 15 MG tablet Take 15 mg by mouth daily.    [provider]  neomycin-polymyxin-hydrocortisone (CORTISPORIN) 3.5-10000-1 ophthalmic suspension Apply 3 drops to ears TID x 5 days 09/27/19   Mar Daring, PA-C  oxyCODONE-acetaminophen (PERCOCET) 5-325 MG tablet Take 1-2 tablets by mouth every 6 (six) hours as needed for up to 5 days for severe pain. 11/24/19 11/29/19  Arta Silence,  MD  simvastatin (ZOCOR) 20 MG tablet Take 1 tablet (20 mg total) by mouth daily. 03/20/19   Chrismon, Vickki Muff, PA  tamsulosin (FLOMAX) 0.4 MG CAPS capsule Take 1 capsule (0.4 mg total) by mouth daily for 7 days. 11/24/19 12/01/19  Arta Silence, MD    Allergies Amoxicillin-pot clavulanate  Family History  Problem Relation Age of Onset  . Dementia Mother   . GER disease Mother   . Hypertension Father   . Arrhythmia Sister   . Heart  murmur Sister     Social History Social History   Tobacco Use  . Smoking status: Former Smoker    Packs/day: 2.00    Years: 30.00    Pack years: 60.00    Types: Cigarettes  . Smokeless tobacco: Never Used  . Tobacco comment: quit in 1991-1992  Vaping Use  . Vaping Use: Never used  Substance Use Topics  . Alcohol use: Not Currently    Alcohol/week: 0.0 standard drinks    Comment: quit over 30 years ago  . Drug use: No    Review of Systems  Constitutional: No fever. Eyes: No redness. ENT: No sore throat. Cardiovascular: Denies chest pain. Respiratory: Denies shortness of breath. Gastrointestinal: Positive for nausea and vomiting. Genitourinary: Negative for dysuria.  Musculoskeletal: Negative for back pain. Skin: Negative for rash. Neurological: Negative for headache.   ____________________________________________   PHYSICAL EXAM:  VITAL SIGNS: ED Triage Vitals  Enc Vitals Group     BP 11/24/19 1617 (!) 178/88     Pulse Rate 11/24/19 1617 (!) 59     Resp 11/24/19 1617 20     Temp 11/24/19 1617 97.6 F (36.4 C)     Temp Source 11/24/19 1617 Oral     SpO2 11/24/19 1617 98 %     Weight 11/24/19 1618 195 lb (88.5 kg)     Height 11/24/19 1618 5' 9"  (1.753 m)     Head Circumference --      Peak Flow --      Pain Score 11/24/19 1618 8     Pain Loc --      Pain Edu? --      Excl. in [place]? --     Constitutional: Alert and oriented. Slightly uncomfortable appearing but in no acute distress. Eyes: Conjunctivae are normal. No scleral icterus. Head: Atraumatic. Nose: No congestion/rhinnorhea. Mouth/Throat: Mucous membranes are moist.   Neck: Normal range of motion.  Cardiovascular: Normal rate, regular rhythm. Good peripheral circulation. Respiratory: Normal respiratory effort.  No retractions.  Gastrointestinal: Soft with mild right lower quadrant and right flank tenderness. No distention.  Genitourinary: No CVA tenderness. Musculoskeletal: No lower extremity  edema.  Extremities warm and well perfused.  Neurologic:  Normal speech and language. No gross focal neurologic deficits are appreciated.  Skin:  Skin is warm and dry. No rash noted. Psychiatric: Mood and affect are normal. Speech and behavior are normal.  ____________________________________________   LABS (all labs ordered are listed, but only abnormal results are displayed)  Labs Reviewed  BASIC METABOLIC PANEL - Abnormal; Notable for the following components:      Result Value   Sodium 134 (*)    Potassium 3.2 (*)    Glucose, Bld 142 (*)    All other components within normal limits  HEPATIC FUNCTION PANEL - Abnormal; Notable for the following components:   Indirect Bilirubin 1.0 (*)    All other components within normal limits  LACTIC ACID, PLASMA - Abnormal; Notable for the following components:  Lactic Acid, Venous 2.1 (*)    All other components within normal limits  URINALYSIS, COMPLETE (UACMP) WITH MICROSCOPIC - Abnormal; Notable for the following components:   Color, Urine YELLOW (*)    APPearance CLEAR (*)    Specific Gravity, Urine >1.046 (*)    Hgb urine dipstick SMALL (*)    Ketones, ur 20 (*)    All other components within normal limits  LIPASE, BLOOD  LACTIC ACID, PLASMA  CBC WITH DIFFERENTIAL/PLATELET  TROPONIN I (HIGH SENSITIVITY)   ____________________________________________  EKG  ED ECG REPORT I, Arta Silence, the attending physician, personally viewed and interpreted this ECG.  Date: 11/24/2019 EKG Time: 1658 Rate: 60 Rhythm: Paced rhythm QRS Axis: normal Intervals: Nonspecific IVCD (consistent with paced rhythm), prolonged PR ST/T Wave abnormalities: normal Narrative Interpretation: Paced rhythm with no evidence of acute ischemia (read by machine as acute MI, however is consistent with changes due to pacemaker; no prior EKG post pacemaker placement is available for  comparison)  ____________________________________________  RADIOLOGY  CT angio abdomen/pelvis: 8 mm right ureteral stone with mild hydronephrosis  ____________________________________________   PROCEDURES  Procedure(s) performed: No  Procedures  Critical Care performed: No ____________________________________________   INITIAL IMPRESSION / ASSESSMENT AND PLAN / ED COURSE  Pertinent labs & imaging results that were available during my care of the patient were reviewed by me and considered in my medical decision making (see chart for details).  74 year old male with PMH as noted above including complete heart block, peripheral artery disease, and hyperlipidemia presents with right flank pain for the last several hours, acute onset and radiating towards his right lower quadrant with associated nausea and one episode of vomiting. He has had no prior history of this pain.  I reviewed the past medical records in [place]. The patient has had no recent ED visits or admissions; he was most recently admitted in early 2020 for pacemaker implantation.  On exam, the patient is uncomfortable but overall well-appearing. He is hypertensive with otherwise normal vital signs. The physical exam is significant for mild right lower quadrant and right flank tenderness.  Differential includes ureteral stone, pyelonephritis, biliary colic, cholecystitis, musculoskeletal pain, or less likely renal infarct or other vascular etiology. I have a very low suspicion for ruptured AAA given the location of the pain, tenderness on exam, and the patient's overall well appearance and stable vital signs.  We will obtain lab work-up, urinalysis, CT angiogram, and reassess.  Given the abnormal EKG with no prior comparison, I will obtain a troponin to rule out ACS although the patient has no chest pain or other cardiac symptoms and there is no clinical suspicion for ACS.  ----------------------------------------- 9:16 PM  on 11/24/2019 -----------------------------------------  CT shows an 8 mm right ureteral stone with mild hydronephrosis.  Lab work-up is unremarkable (initial lactate was minimally elevated although there is no evidence of sepsis/infection and I suspect that this was due to the patient's vomiting; repeat after fluids normal).  The patient had persistent pain after the initial morphine, however now after dose of Dilaudid his pain has been well controlled for 4 hours.  The patient feels much better and is tolerating p.o.  Although the stone is large, given that the patient's pain is controlled he is stable for discharge home with close outpatient follow-up.  I discussed the case with Dr. Diamantina Providence from urology who advised that the patient can follow-up with him in the office in the next 2 to 3 days.  I have prescribed Flomax and  Percocet for pain.  I discussed the results of the work-up and the plan of care with the patient.  I gave him thorough return precautions and he expressed understanding.  ____________________________________________   FINAL CLINICAL IMPRESSION(S) / ED DIAGNOSES  Final diagnoses:  Ureteral stone      NEW MEDICATIONS STARTED DURING THIS VISIT:  New Prescriptions   OXYCODONE-ACETAMINOPHEN (PERCOCET) 5-325 MG TABLET    Take 1-2 tablets by mouth every 6 (six) hours as needed for up to 5 days for severe pain.   TAMSULOSIN (FLOMAX) 0.4 MG CAPS CAPSULE    Take 1 capsule (0.4 mg total) by mouth daily for 7 days.     Note:  This document was prepared using Dragon voice recognition software and may include unintentional dictation errors.    Arta Silence, MD 11/24/19 2118

## 2019-11-24 NOTE — ED Notes (Signed)
Patient transported to CT 

## 2019-11-24 NOTE — ED Triage Notes (Addendum)
Pt with right flank pain starting today, radiating to abdomen and groin. Pt with hx of pacemaker and neuropathy. Pt given 50 mcg fentanyl and 4 mg zofran by EMS at 1545. Pt states pain is 8/10. Pain accompanied by nausea and urinary frequency

## 2019-11-24 NOTE — ED Notes (Signed)
Pt continues to be in 8/10 pain, EDP notified

## 2019-11-26 ENCOUNTER — Encounter: Payer: Self-pay | Admitting: Urology

## 2019-11-26 ENCOUNTER — Ambulatory Visit
Admission: RE | Admit: 2019-11-26 | Discharge: 2019-11-26 | Disposition: A | Discharge status: Home / Self Care | Payer: Medicare HMO | Attending: Urology | Admitting: Urology

## 2019-11-26 ENCOUNTER — Ambulatory Visit
Admission: RE | Admit: 2019-11-26 | Discharge: 2019-11-26 | Disposition: A | Discharge status: Home / Self Care | Payer: Medicare HMO | Source: Ambulatory Visit | Attending: Urology | Admitting: Urology

## 2019-11-26 ENCOUNTER — Other Ambulatory Visit: Payer: Self-pay

## 2019-11-26 ENCOUNTER — Other Ambulatory Visit: Payer: Self-pay | Admitting: Radiology

## 2019-11-26 ENCOUNTER — Ambulatory Visit: Payer: Medicare HMO | Admitting: Urology

## 2019-11-26 ENCOUNTER — Other Ambulatory Visit: Payer: Self-pay | Admitting: Urology

## 2019-11-26 VITALS — BP 140/84 | HR 76 | Ht 69.0 in | Wt 195.0 lb

## 2019-11-26 DIAGNOSIS — N200 Calculus of kidney: Secondary | ICD-10-CM

## 2019-11-26 DIAGNOSIS — Z87442 Personal history of urinary calculi: Secondary | ICD-10-CM | POA: Diagnosis not present

## 2019-11-26 DIAGNOSIS — N201 Calculus of ureter: Secondary | ICD-10-CM | POA: Diagnosis not present

## 2019-11-26 NOTE — Patient Instructions (Signed)
Ureteral Stent Implantation  Ureteral stent implantation is a procedure to insert (implant) a flexible, soft, plastic tube (stent) into a ureter. Ureters are the tube-like parts of the body that drain urine from the kidneys. The stent supports the ureter while it heals and helps to drain urine. You may have a ureteral stent implanted after having a procedure to remove a blockage from the ureter (ureterolysis or pyeloplasty). You may also have a stent implanted to open the flow of urine when you have a blockage caused by a kidney stone, tumor, blood clot, or infection. You have two ureters, one on each side of the body. The ureters connect the kidneys to the organ that holds urine until it passes out of the body (bladder). The stent is placed so that one end is in the kidney, and one end is in the bladder. The stent is usually taken out after your ureter has healed. Depending on your condition, you may have a stent for just a few weeks, or you may have a long-term stent that will need to be replaced every few months. Tell a health care provider about:  Any allergies you have.  All medicines you are taking, including vitamins, herbs, eye drops, creams, and over-the-counter medicines.  Any problems you or family members have had with anesthetic medicines.  Any blood disorders you have.  Any surgeries you have had.  Any medical conditions you have.  Whether you are pregnant or may be pregnant. What are the risks? Generally, this is a safe procedure. However, problems may occur, including:  Infection.  Bleeding.  Allergic reactions to medicines.  Damage to other structures or organs. Tearing (perforation) of the ureter is possible.  Movement of the stent away from where it is placed during surgery (migration). What happens before the procedure? Medicines Ask your health care provider about:  Changing or stopping your regular medicines. This is especially important if you are taking  diabetes medicines or blood thinners.  Taking medicines such as aspirin and ibuprofen. These medicines can thin your blood. Do not take these medicines unless your health care provider tells you to take them.  Taking over-the-counter medicines, vitamins, herbs, and supplements. Eating and drinking Follow instructions from your health care provider about eating and drinking, which may include:  8 hours before the procedure - stop eating heavy meals or foods, such as meat, fried foods, or fatty foods.  6 hours before the procedure - stop eating light meals or foods, such as toast or cereal.  6 hours before the procedure - stop drinking milk or drinks that contain milk.  2 hours before the procedure - stop drinking clear liquids. Staying hydrated Follow instructions from your health care provider about hydration, which may include:  Up to 2 hours before the procedure - you may continue to drink clear liquids, such as water, clear fruit juice, black coffee, and plain tea. General instructions  Do not drink alcohol.  Do not use any products that contain nicotine or tobacco for at least 4 weeks before the procedure. These products include cigarettes, e-cigarettes, and chewing tobacco. If you need help quitting, ask your health care provider.  You may have an exam or testing, such as imaging or blood tests.  Ask your health care provider what steps will be taken to help prevent infection. These may include: ? Removing hair at the surgery site. ? Washing skin with a germ-killing soap. ? Taking antibiotic medicine.  Plan to have someone take you home  from the hospital or clinic.  If you will be going home right after the procedure, plan to have someone with you for 24 hours. What happens during the procedure?  An IV will be inserted into one of your veins.  You may be given a medicine to help you relax (sedative).  You may be given a medicine to make you fall asleep (general  anesthetic).  A thin, tube-shaped instrument with a light and tiny camera at the end (cystoscope) will be inserted into your urethra. The urethra is the tube that drains urine from the bladder out of the body. In men, the urethra opens at the end of the penis. In women, the urethra opens in front of the vaginal opening.  The cystoscope will be passed into your bladder.  A thin wire (guide wire) will be passed through your bladder and into your ureter. This is used to guide the stent into your ureter.  The stent will be inserted into your ureter.  The guide wire and the cystoscope will be removed.  A flexible tube (catheter) may be inserted through your urethra so that one end is in your bladder. This helps to drain urine from your bladder. The procedure may vary among hospitals and health care providers. What happens after the procedure?  Your blood pressure, heart rate, breathing rate, and blood oxygen level will be monitored until you leave the hospital or clinic.  You may continue to receive medicine and fluids through an IV.  You may have some soreness or pain in your abdomen and urethra. Medicines will be available to help you.  You will be encouraged to get up and walk around as soon as you can.  You may have a catheter draining your urine.  You will have some blood in your urine.  Do not drive for 24 hours if you were given a sedative during your procedure. Summary  Ureteral stent implantation is a procedure to insert a flexible, soft, plastic tube (stent) into a ureter.  You may have a stent implanted to support the ureter while it heals after a procedure or to open the flow of urine if there is a blockage.  Follow instructions from your health care provider about taking medicines and about eating and drinking before the procedure.  Depending on your condition, you may have a stent for just a few weeks, or you may have a long-term stent that will need to be replaced every  few months. This information is not intended to replace advice given to you by your health care provider. Make sure you discuss any questions you have with your health care provider. Document Revised: 12/20/2017 Document Reviewed: 12/21/2017 Elsevier Patient Education  2020 [employer] for Kidney Stones Laser therapy for kidney stones is a procedure to break up small, hard mineral deposits that form in the kidney (kidney stones). The procedure is done using a device that produces a focused beam of light (laser). The laser breaks up kidney stones into pieces that are small enough to be passed out of the body through urination or removed from the body during the procedure. You may need laser therapy if you have kidney stones that are painful or block your urinary tract. This procedure is done by inserting a tube (ureteroscope) into your kidney through the urethral opening. The urethra is the part of the body that drains urine from the bladder. In women, the urethra opens above the vaginal opening. In men, the urethra opens  at the tip of the penis. The ureteroscope is inserted through the urethra, and surgical instruments are moved through the bladder and the muscular tube that connects the kidney to the bladder (ureter) until they reach the kidney. Tell a health care provider about:  Any allergies you have.  All medicines you are taking, including vitamins, herbs, eye drops, creams, and over-the-counter medicines.  Any problems you or family members have had with anesthetic medicines.  Any blood disorders you have.  Any surgeries you have had.  Any medical conditions you have.  Whether you are pregnant or may be pregnant. What are the risks? Generally, this is a safe procedure. However, problems may occur, including:  Infection.  Bleeding.  Allergic reactions to medicines.  Damage to the urethra, bladder, or ureter.  Urinary tract infection (UTI).  Narrowing of the  urethra (urethral stricture).  Difficulty passing urine.  Blockage of the kidney caused by a fragment of kidney stone. What happens before the procedure? Medicines  Ask your health care provider about: ? Changing or stopping your regular medicines. This is especially important if you are taking diabetes medicines or blood thinners. ? Taking medicines such as aspirin and ibuprofen. These medicines can thin your blood. Do not take these medicines unless your health care provider tells you to take them. ? Taking over-the-counter medicines, vitamins, herbs, and supplements. Eating and drinking Follow instructions from your health care provider about eating and drinking, which may include:  8 hours before the procedure - stop eating heavy meals or foods, such as meat, fried foods, or fatty foods.  6 hours before the procedure - stop eating light meals or foods, such as toast or cereal.  6 hours before the procedure - stop drinking milk or drinks that contain milk.  2 hours before the procedure - stop drinking clear liquids. Staying hydrated Follow instructions from your health care provider about hydration, which may include:  Up to 2 hours before the procedure - you may continue to drink clear liquids, such as water, clear fruit juice, black coffee, and plain tea.  General instructions  You may have a physical exam before the procedure. You may also have tests, such as imaging tests and blood or urine tests.  If your ureter is too narrow, your health care provider may place a soft, flexible tube (stent) inside of it. The stent may be placed days or weeks before your laser therapy procedure.  Plan to have someone take you home from the hospital or clinic.  If you will be going home right after the procedure, plan to have someone stay with you for 24 hours.  Do not use any products that contain nicotine or tobacco for at least 4 weeks before the procedure. These products include  cigarettes, e-cigarettes, and chewing tobacco. If you need help quitting, ask your health care provider.  Ask your health care provider: ? How your surgical site will be marked or identified. ? What steps will be taken to help prevent infection. These may include:  Removing hair at the surgery site.  Washing skin with a germ-killing soap.  Taking antibiotic medicine. What happens during the procedure?   An IV will be inserted into one of your veins.  You will be given one or more of the following: ? A medicine to help you relax (sedative). ? A medicine to numb the area (local anesthetic). ? A medicine to make you fall asleep (general anesthetic).  A ureteroscope will be inserted into your  urethra. The ureteroscope will send images to a video screen in the operating room to guide your surgeon to the area of your kidney that will be treated.  A small, flexible tube will be threaded through the ureteroscope and into your bladder and ureter, up to your kidney.  The laser device will be inserted into your kidney through the tube. Your surgeon will pulse the laser on and off to break up kidney stones.  A surgical instrument that has a tiny wire basket may be inserted through the tube into your kidney to remove the pieces of broken kidney stone. The procedure may vary among health care providers and hospitals. What happens after the procedure?  Your blood pressure, heart rate, breathing rate, and blood oxygen level will be monitored until you leave the hospital or clinic.  You will be given pain medicine as needed.  You may continue to receive antibiotics.  You may have a stent temporarily placed in your ureter.  Do not drive for 24 hours if you were given a sedative during your procedure.  You may be given a strainer to collect any stone fragments that you pass in your urine. Your health care provider may have these tested. Summary  Laser therapy for kidney stones is a procedure  to break up kidney stones into pieces that are small enough to be passed out of the body through urination or removed during the procedure.  Follow instructions from your health care provider about eating and drinking before the procedure.  During the procedure, the ureteroscope will send images to a video screen to guide your surgeon to the area of your kidney that will be treated.  Do not drive for 24 hours if you were given a sedative during your procedure. This information is not intended to replace advice given to you by your health care provider. Make sure you discuss any questions you have with your health care provider. Document Revised: 11/24/2017 Document Reviewed: 11/24/2017 Elsevier Patient Education  2020 [employer].

## 2019-11-26 NOTE — Progress Notes (Signed)
11/26/19 11:03 AM   Justin Warren 09-May-1945 [id number]  CC: Right ureteral stone  HPI: I saw Justin Warren and his wife in urology clinic for evaluation of an 8 mm right ureteral stone.  He developed acute onset of severe right flank and groin pain and nausea over the weekend and presented to the ED, where CT showed an 8 mm right proximal ureteral stone as well as 2 other right renal stones measuring 4 mm each.  His lab work was relatively benign, and he was discharged home with close urology follow-up.  He reports his pain is been very well controlled on narcotics since discharge from the ED.  KUB today shows persistent stone over the right proximal ureter.  He denies any fevers, chills, or urinary symptoms.  He denies any gross hematuria.  No prior stone events.  He has a pacemaker and is followed by Dr. Ubaldo Glassing in cardiology.   PMH: Past Medical History:  Diagnosis Date  . GERD (gastroesophageal reflux disease)   . Hyperlipidemia     Surgical History: Past Surgical History:  Procedure Laterality Date  . APPENDECTOMY  1994  . CARPAL TUNNEL RELEASE  2012  . LUMBAR LAMINECTOMIES  1976 & 1978   Dr. [name]  . PACEMAKER INSERTION Left 04/20/2018   Procedure: INSERTION PACEMAKER-DUAL CHAMBER;  Surgeon: [name], MD;  Location: [facility] ORS;  Service: Cardiovascular;  Laterality: Left;  . REPLACEMENT TOTAL KNEE    . SIGMOIDOSCOPY  2010    Family History: Family History  Problem Relation Age of Onset  . Dementia Mother   . GER disease Mother   . Hypertension Father   . Arrhythmia Sister   . Heart murmur Sister     Social History:  reports that he has quit smoking. His smoking use included cigarettes. He has a 60.00 pack-year smoking history. He has never used smokeless tobacco. He reports previous alcohol use. He reports that he does not use drugs.  Physical Exam: BP 140/84   Pulse 76   Ht 5' 9"  (1.753 m)   Wt 195 lb (88.5 kg)   BMI 28.80 kg/m    Constitutional:   Alert and oriented, No acute distress. Cardiovascular: No clubbing, cyanosis, or edema. Respiratory: Normal respiratory effort, no increased work of breathing. GI: Abdomen is soft, nontender, nondistended, no abdominal masses GU: No CVA tenderness  Laboratory Data: Reviewed  Pertinent Imaging: I have personally reviewed the CT showing an 8 mm right proximal ureteral stone with upstream hydronephrosis and 2 other right renal stones.  Stone measures 1300HU, 15 cm SSD.  Assessment & Plan:   74 year old male with 8 mm right proximal ureteral stone as well as 2 other right renal stones, and no clinical or laboratory signs of infection.  We discussed various treatment options for urolithiasis including observation with or without medical expulsive therapy, shockwave lithotripsy (SWL), ureteroscopy and laser lithotripsy with stent placement, and percutaneous nephrolithotomy.  We discussed that management is based on stone size, location, density, patient co-morbidities, and patient preference.   Stones <23m in size have a >80% spontaneous passage rate. Data surrounding the use of tamsulosin for medical expulsive therapy is controversial, but meta analyses suggests it is most efficacious for distal stones between 5-191min size. Possible side effects include dizziness/lightheadedness, and retrograde ejaculation.  SWL has a lower stone free rate in a single procedure, but also a lower complication rate compared to ureteroscopy and avoids a stent and associated stent related symptoms. Possible complications include renal hematoma,  steinstrasse, and need for additional treatment.  Ureteroscopy with laser lithotripsy and stent placement has a higher stone free rate than SWL in a single procedure, however increased complication rate including possible infection, ureteral injury, bleeding, and stent related morbidity. Common stent related symptoms include dysuria, urgency/frequency, and flank pain.  I  recommended ureteroscopy with his very dense stone, increased skin to stone distance, and 2 other renal stones that could be treated simultaneously with ureteroscopy.  Right ureteroscopy, laser lithotripsy, stent placement this Friday   Justin Vancuren Sanger, MD 11/26/2019  [facility] [address] [place], [place] [address] [phone]

## 2019-11-26 NOTE — H&P (View-Only) (Signed)
11/26/19 11:03 AM   Justin Warren Sep 12, 1945 [id number]  CC: Right ureteral stone  HPI: I saw Mr. Justin Warren and his wife in urology clinic for evaluation of an 8 mm right ureteral stone.  He developed acute onset of severe right flank and groin pain and nausea over the weekend and presented to the ED, where CT showed an 8 mm right proximal ureteral stone as well as 2 other right renal stones measuring 4 mm each.  His lab work was relatively benign, and he was discharged home with close urology follow-up.  He reports his pain is been very well controlled on narcotics since discharge from the ED.  KUB today shows persistent stone over the right proximal ureter.  He denies any fevers, chills, or urinary symptoms.  He denies any gross hematuria.  No prior stone events.  He has a pacemaker and is followed by Dr. Ubaldo Glassing in cardiology.   PMH: Past Medical History:  Diagnosis Date  . GERD (gastroesophageal reflux disease)   . Hyperlipidemia     Surgical History: Past Surgical History:  Procedure Laterality Date  . APPENDECTOMY  1994  . CARPAL TUNNEL RELEASE  2012  . LUMBAR LAMINECTOMIES  1976 & 1978   Dr. [name]  . PACEMAKER INSERTION Left 04/20/2018   Procedure: INSERTION PACEMAKER-DUAL CHAMBER;  Surgeon: [name], MD;  Location: [facility] ORS;  Service: Cardiovascular;  Laterality: Left;  . REPLACEMENT TOTAL KNEE    . SIGMOIDOSCOPY  2010    Family History: Family History  Problem Relation Age of Onset  . Dementia Mother   . GER disease Mother   . Hypertension Father   . Arrhythmia Sister   . Heart murmur Sister     Social History:  reports that he has quit smoking. His smoking use included cigarettes. He has a 60.00 pack-year smoking history. He has never used smokeless tobacco. He reports previous alcohol use. He reports that he does not use drugs.  Physical Exam: BP 140/84   Pulse 76   Ht 5' 9"  (1.753 m)   Wt 195 lb (88.5 kg)   BMI 28.80 kg/m    Constitutional:   Alert and oriented, No acute distress. Cardiovascular: No clubbing, cyanosis, or edema. Respiratory: Normal respiratory effort, no increased work of breathing. GI: Abdomen is soft, nontender, nondistended, no abdominal masses GU: No CVA tenderness  Laboratory Data: Reviewed  Pertinent Imaging: I have personally reviewed the CT showing an 8 mm right proximal ureteral stone with upstream hydronephrosis and 2 other right renal stones.  Stone measures 1300HU, 15 cm SSD.  Assessment & Plan:   74 year old male with 8 mm right proximal ureteral stone as well as 2 other right renal stones, and no clinical or laboratory signs of infection.  We discussed various treatment options for urolithiasis including observation with or without medical expulsive therapy, shockwave lithotripsy (SWL), ureteroscopy and laser lithotripsy with stent placement, and percutaneous nephrolithotomy.  We discussed that management is based on stone size, location, density, patient co-morbidities, and patient preference.   Stones <3m in size have a >80% spontaneous passage rate. Data surrounding the use of tamsulosin for medical expulsive therapy is controversial, but meta analyses suggests it is most efficacious for distal stones between 5-150min size. Possible side effects include dizziness/lightheadedness, and retrograde ejaculation.  SWL has a lower stone free rate in a single procedure, but also a lower complication rate compared to ureteroscopy and avoids a stent and associated stent related symptoms. Possible complications include renal hematoma,  steinstrasse, and need for additional treatment.  Ureteroscopy with laser lithotripsy and stent placement has a higher stone free rate than SWL in a single procedure, however increased complication rate including possible infection, ureteral injury, bleeding, and stent related morbidity. Common stent related symptoms include dysuria, urgency/frequency, and flank pain.  I  recommended ureteroscopy with his very dense stone, increased skin to stone distance, and 2 other renal stones that could be treated simultaneously with ureteroscopy.  Right ureteroscopy, laser lithotripsy, stent placement this Friday   Justin Freeman Hesston, MD 11/26/2019  [facility] [address], [place], [place] [address] [phone]

## 2019-11-28 ENCOUNTER — Other Ambulatory Visit
Admission: RE | Admit: 2019-11-28 | Discharge: 2019-11-28 | Disposition: A | Discharge status: Home / Self Care | Payer: Medicare HMO | Source: Ambulatory Visit | Attending: Urology | Admitting: Urology

## 2019-11-28 ENCOUNTER — Other Ambulatory Visit: Payer: Self-pay

## 2019-11-28 DIAGNOSIS — Z01812 Encounter for preprocedural laboratory examination: Secondary | ICD-10-CM | POA: Insufficient documentation

## 2019-11-28 DIAGNOSIS — Z20822 Contact with and (suspected) exposure to covid-19: Secondary | ICD-10-CM | POA: Insufficient documentation

## 2019-11-28 HISTORY — DX: Anxiety disorder, unspecified: F41.9

## 2019-11-28 HISTORY — DX: Bradycardia, unspecified: R00.1

## 2019-11-28 HISTORY — DX: Personal history of urinary calculi: Z87.442

## 2019-11-28 LAB — SARS CORONAVIRUS 2 (TAT 6-24 HRS): SARS Coronavirus 2: NEGATIVE

## 2019-11-28 NOTE — Patient Instructions (Addendum)
Your procedure is scheduled on: Friday, September 3 Report to Day Surgery on the 2nd floor of the [facility]. To find out your arrival time, please call [phone] between 1PM - 3PM on: Thursday, September 2  REMEMBER: Instructions that are not followed completely may result in serious medical risk, up to and including death; or upon the discretion of your surgeon and anesthesiologist your surgery may need to be rescheduled.  Do not eat food after midnight the night before surgery.  No gum chewing, lozengers or hard candies.  You may however, drink CLEAR liquids up to 2 hours before you are scheduled to arrive for your surgery. Do not drink anything within 2 hours of your scheduled arrival time.  Clear liquids include: - water  - apple juice without pulp - gatorade (not RED) - black coffee or tea (Do NOT add milk or creamers to the coffee or tea) Do NOT drink anything that is not on this list.  TAKE THESE MEDICATIONS THE MORNING OF SURGERY WITH A SIP OF WATER:  1.  Buspirone (Buspar) 2.  Famotidine (Pepcid) 3.  Fluoxetine (Prozac) 4.  Gabapentin 5.  Oxycodone or Tylenol if needed for pain 6.  Tamsulosin (Flomax)  One week prior to surgery: Stop Anti-inflammatories (NSAIDS) such as Advil, Aleve, Ibuprofen, Motrin, Naproxen, Naprosyn and Aspirin based products such as Excedrin, Goodys Powder, BC Powder. Stop ANY OVER THE COUNTER supplements until after surgery. (You may continue taking Tylenol, Vitamin D, Vitamin B, and multivitamin.)  No Alcohol for 24 hours before or after surgery.  No Smoking including e-cigarettes for 24 hours prior to surgery.  No chewable tobacco products for at least 6 hours prior to surgery.  No nicotine patches on the day of surgery.  On the morning of surgery brush your teeth with toothpaste and water, you may rinse your mouth with mouthwash if you wish. Do not swallow any toothpaste or mouthwash.  Do not wear jewelry, make-up, hairpins, clips  or nail polish.  Do not wear lotions, powders, or perfumes.   Do not shave 48 hours prior to surgery.   Contact lenses, hearing aids and dentures may not be worn into surgery.  Do not bring valuables to the hospital. [facility] is not responsible for any missing/lost belongings or valuables.   Notify your doctor if there is any change in your medical condition (cold, fever, infection).  Wear comfortable clothing (specific to your surgery type) to the hospital.  Plan for stool softeners for home use; pain medications have a tendency to cause constipation. You can also help prevent constipation by eating foods high in fiber such as fruits and vegetables and drinking plenty of fluids as your diet allows.  After surgery, you can help prevent lung complications by doing breathing exercises.  Take deep breaths and cough every 1-2 hours. Your doctor may order a device called an Incentive Spirometer to help you take deep breaths.  If you are being discharged the day of surgery, you will not be allowed to drive home. You will need a responsible adult (18 years or older) to drive you home and stay with you that night.   If you are taking public transportation, you will need to have a responsible adult (18 years or older) with you. Please confirm with your physician that it is acceptable to use public transportation.   Please call the [place] Dept. at [phone] if you have any questions about these instructions.  Visitation Policy:  Patients undergoing a  surgery or procedure may have one family member or support person with them as long as that person is not COVID-19 positive or experiencing its symptoms.  That person may remain in the waiting area during the procedure.  Masking is required regardless of vaccination status.  Systemwide, no visitors 17 or younger.

## 2019-11-28 NOTE — Progress Notes (Signed)
[facility] Perioperative Services  Pre-Admission/Anesthesia Testing Clinical Review  Date: 11/29/19  Patient Demographics:  Name: Justin Warren DOB:   28-Dec-1945 MRN:   [id number]  Planned Surgical Procedure(s):    Case: [id number] Date/Time: 11/30/19 1115   Procedure: CYSTOSCOPY/URETEROSCOPY/HOLMIUM LASER/STENT PLACEMENT (Right )   Anesthesia type: Choice   Pre-op diagnosis: Right Ureteral Stone   Location: [facility] OR ROOM 10 / [place] ORS FOR ANESTHESIA GROUP   Surgeons: [name], MD     NOTE: Available PAT nursing documentation and vital signs have been reviewed. Clinical nursing staff has updated patient's PMH/PSHx, current medication list, and drug allergies/intolerances to ensure comprehensive history available to assist in medical decision making as it pertains to the aforementioned surgical procedure and anticipated anesthetic course.   Clinical Discussion:  Justin Warren is a 74 y.o. male who is submitted for pre-surgical anesthesia review and clearance prior to him undergoing the above procedure. Patient is a Former Smoker (60 pack years). 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  Patient is followed by cardiology Ubaldo Glassing, MD). He was last seen in the cardiology clinic on 10/24/2019; notes reviewed. Patient reported to be doing "fairly well" from a cardiovascular perspective. Able to perform ADLs without difficulties. Functional capacity felt to be > 4 METS. At the time of his visit the patient denied any angina, palpitations, shortness of breath, orthopnea, PND, dizziness, or presyncope/syncope. Clinically doing well following PPM placement. Exam revealed mild peripheral edema. Last stress echocardiogram done in 03/2018 revealed an LVEF of >55% (see full results below).     Given patient's cardiac history, pre-surgical cardiac clearance has been sought by the PAT team. Per cardiology, patient may proceed with surgery as planned with a LOW risk stratification. This patient is on daily antiplatelet therapy. He has been made aware of recommendations from cardiology asking him to hold his daily low dose ASA x 7 days prior to his procedure.   He denies previous intra-operative complications with anesthesia. He underwent a general anesthetic course here (ASA III) in 03/2018 with no documented complications.   Vitals with BMI 11/28/2019 11/26/2019 11/24/2019  Height 5' 9"  5' 9"  -  Weight 193 lbs 195 lbs -  BMI 55.97 41.63 -  Systolic - 845 364  Diastolic - 84 72  Pulse - 76 60    Providers/Specialists:   NOTE: Primary physician provider listed below. Patient may have been seen by APP or partner within same practice.   PROVIDER ROLE LAST OV  No att. providers found Urology (Surgeon) 11/26/2019  Chrismon, Vickki Muff, [place] Primary Care Provider 06/28/2019  Bartholome Bill, MD Cardiology 10/24/2019   Allergies:  Amoxicillin-pot clavulanate  Current Home Medications:   . acetaminophen (TYLENOL) 650 MG CR tablet  . Apple Cider Vinegar 500 MG TABS  . busPIRone (BUSPAR) 15 MG tablet  . donepezil (ARICEPT) 5 MG tablet  . famotidine (PEPCID) 20 MG tablet  . FLUoxetine (PROZAC) 20 MG capsule  . gabapentin (NEURONTIN) 300 MG capsule  . meloxicam (MOBIC) 15 MG tablet  . oxyCODONE-acetaminophen (PERCOCET) 5-325 MG tablet  . simvastatin (ZOCOR) 20 MG tablet  . tamsulosin (FLOMAX) 0.4 MG CAPS capsule   No current facility-administered medications for this encounter.   History:   Past Medical History:  Diagnosis Date  . Anxiety   . Bradycardia   . GERD (gastroesophageal reflux disease)   .  History of kidney stones   . Hyperlipidemia    Past Surgical History:  Procedure Laterality Date  . APPENDECTOMY  1994  . BACK SURGERY    . CARPAL TUNNEL RELEASE Right 2012   . CATARACT EXTRACTION, BILATERAL  2019  . EYE SURGERY    . INSERT / REPLACE / REMOVE PACEMAKER    . JOINT REPLACEMENT    . LUMBAR LAMINECTOMIES  1976 & 1978   Dr. [name]  . PACEMAKER INSERTION Left 04/20/2018   Procedure: INSERTION PACEMAKER-DUAL CHAMBER;  Surgeon: [name], MD;  Location: [facility] ORS;  Service: Cardiovascular;  Laterality: Left;  . 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 SIGMOIDOSCOPY  2010   Family History  Problem Relation Age of Onset  . Dementia Mother   . GER disease Mother   . Hypertension Father   . Arrhythmia Sister   . Heart murmur Sister    Social History   Tobacco Use  . Smoking status: Former Smoker    Packs/day: 2.00    Years: 30.00    Pack years: 60.00    Types: Cigarettes  . Smokeless tobacco: Never Used  . Tobacco comment: quit in 1991-1992  Vaping Use  . Vaping Use: Never used  Substance Use Topics  . Alcohol use: Not Currently    Alcohol/week: 0.0 standard drinks    Comment: quit over 30 years ago  . Drug use: No    Pertinent Clinical Results:  LABS: Labs reviewed: Acceptable for surgery.  Hospital Outpatient Visit on 11/28/2019  Component Date Value Ref Range Status  . SARS Coronavirus 2 11/28/2019 NEGATIVE  NEGATIVE Final   Comment: (NOTE) SARS-CoV-2 target nucleic acids are NOT DETECTED.  The SARS-CoV-2 RNA is generally detectable in upper and lower respiratory specimens during the acute phase of infection. Negative results do not preclude SARS-CoV-2 infection, do not rule out co-infections with other pathogens, and should not be used as the sole basis for treatment or other patient management decisions. Negative results must be combined with clinical observations, patient history, and epidemiological information. The expected result is Negative.  Fact Sheet for Patients: SugarRoll.be  Fact Sheet for Healthcare Providers: https://www.woods-mathews.com/  This test is  not yet approved or cleared by the [place] FDA and  has been authorized for detection and/or diagnosis of SARS-CoV-2 by FDA under an Emergency Use Authorization (EUA). This EUA will remain  in effect (meaning this test can be used) for the duration of the COVID-19 declaration under Se                          ction 564(b)(1) of the Act, 21 U.S.C. section 360bbb-3(b)(1), unless the authorization is terminated or revoked sooner.  Performed at [facility], [place] [address], [place], [place] [id number]     ECG: Date: 11/24/2019 Time ECG obtained: 1658 PM Rate: 60 bpm Rhythm:  Ventricular paced Axis (leads I and aVF): Normal Intervals: QRS 157 ms. QTc 486 ms. ST segment and T wave changes: No evidence of acute ST segment elevation or depression Comparison: Similar to previous tracing obtained on 10/25/2018 performed at [facility].    IMAGING / PROCEDURES: STRESS ECHOCARDIOGRAM done on 03/31/2018 1. Normal stress echocardiogram  2. No valvular stenosis 3. Resting EF: >55% (Est.) 4. Post Stress EF: >55% (Est.)  5. ECG Results: Normal    Impression and Plan:  Justin Warren has been referred for pre-anesthesia review and clearance prior him undergoing the planned  anesthetic and procedural courses. Available labs, pertinent testing, and imaging results were personally reviewed by me. This patient has been appropriately cleared by cardiology. Perioperative cardiac device programming form has been completed and placed on the patient's chart.   Based on clinical review performed today (11/29/19), barring any significant acute changes in the patient's overall condition, it is anticipated that he will be able to proceed with the planned surgical intervention. Any acute changes in clinical condition may necessitate his procedure being postponed and/or cancelled. Pre-surgical instructions were reviewed with the patient during his PAT appointment and questions were fielded by PAT  clinical staff.  Honor Loh, MSN, APRN, FNP-C, CEN [facility]  Peri-operative Services Nurse Practitioner Phone: [phone] 11/29/19 10:27 AM  NOTE: This note has been prepared using Dragon dictation software. Despite my best ability to proofread, there is always the potential that unintentional transcriptional errors may still occur from this process.

## 2019-11-29 ENCOUNTER — Other Ambulatory Visit: Admission: RE | Admit: 2019-11-29 | Payer: Medicare HMO | Source: Ambulatory Visit

## 2019-11-30 ENCOUNTER — Other Ambulatory Visit: Payer: Self-pay

## 2019-11-30 ENCOUNTER — Encounter: Payer: Self-pay | Admitting: Urology

## 2019-11-30 ENCOUNTER — Ambulatory Visit: Payer: Medicare HMO

## 2019-11-30 ENCOUNTER — Ambulatory Visit: Payer: Medicare HMO | Admitting: Urgent Care

## 2019-11-30 ENCOUNTER — Ambulatory Visit
Admission: RE | Admit: 2019-11-30 | Discharge: 2019-11-30 | Disposition: A | Discharge status: Home / Self Care | Payer: Medicare HMO | Source: Ambulatory Visit | Attending: Urology | Admitting: Urology

## 2019-11-30 ENCOUNTER — Ambulatory Visit: Payer: Medicare HMO | Admitting: Anesthesiology

## 2019-11-30 ENCOUNTER — Encounter
Admission: RE | Disposition: A | Discharge status: Home / Self Care | Payer: Self-pay | Source: Ambulatory Visit | Attending: Urology

## 2019-11-30 DIAGNOSIS — N132 Hydronephrosis with renal and ureteral calculous obstruction: Secondary | ICD-10-CM | POA: Diagnosis not present

## 2019-11-30 DIAGNOSIS — Z87891 Personal history of nicotine dependence: Secondary | ICD-10-CM | POA: Diagnosis not present

## 2019-11-30 DIAGNOSIS — Z96659 Presence of unspecified artificial knee joint: Secondary | ICD-10-CM | POA: Insufficient documentation

## 2019-11-30 DIAGNOSIS — Z950 Presence of cardiac pacemaker: Secondary | ICD-10-CM | POA: Insufficient documentation

## 2019-11-30 DIAGNOSIS — F418 Other specified anxiety disorders: Secondary | ICD-10-CM | POA: Diagnosis not present

## 2019-11-30 DIAGNOSIS — I739 Peripheral vascular disease, unspecified: Secondary | ICD-10-CM | POA: Insufficient documentation

## 2019-11-30 DIAGNOSIS — E7800 Pure hypercholesterolemia, unspecified: Secondary | ICD-10-CM | POA: Diagnosis not present

## 2019-11-30 DIAGNOSIS — N201 Calculus of ureter: Secondary | ICD-10-CM | POA: Diagnosis not present

## 2019-11-30 DIAGNOSIS — N202 Calculus of kidney with calculus of ureter: Secondary | ICD-10-CM | POA: Diagnosis not present

## 2019-11-30 DIAGNOSIS — E785 Hyperlipidemia, unspecified: Secondary | ICD-10-CM | POA: Diagnosis not present

## 2019-11-30 HISTORY — PX: CYSTOSCOPY/URETEROSCOPY/HOLMIUM LASER/STENT PLACEMENT: SHX6546

## 2019-11-30 HISTORY — PX: CYSTOSCOPY W/ RETROGRADES: SHX1426

## 2019-11-30 SURGERY — CYSTOSCOPY/URETEROSCOPY/HOLMIUM LASER/STENT PLACEMENT
Anesthesia: General | Site: Ureter | Laterality: Right

## 2019-11-30 MED ORDER — CHLORHEXIDINE GLUCONATE 0.12 % MT SOLN: 15.0000 mL | Freq: Once | OROMUCOSAL | Status: AC

## 2019-11-30 MED ORDER — PHENYLEPHRINE HCL (PRESSORS) 10 MG/ML IV SOLN
INTRAVENOUS | Status: DC | PRN
  Administered 2019-11-30 (×2): 100 ug via INTRAVENOUS

## 2019-11-30 MED ORDER — MIDAZOLAM HCL 2 MG/2ML IJ SOLN
INTRAMUSCULAR | Status: DC | PRN
  Administered 2019-11-30: 1 mg via INTRAVENOUS

## 2019-11-30 MED ORDER — LACTATED RINGERS IV SOLN: INTRAVENOUS | Status: DC

## 2019-11-30 MED ORDER — ORAL CARE MOUTH RINSE: 15.0000 mL | Freq: Once | OROMUCOSAL | Status: AC

## 2019-11-30 MED ORDER — SUGAMMADEX SODIUM 200 MG/2ML IV SOLN
INTRAVENOUS | Status: DC | PRN
  Administered 2019-11-30: 176 mg via INTRAVENOUS

## 2019-11-30 MED ORDER — ROCURONIUM BROMIDE 100 MG/10ML IV SOLN
INTRAVENOUS | Status: DC | PRN
  Administered 2019-11-30: 50 mg via INTRAVENOUS

## 2019-11-30 MED ORDER — BELLADONNA ALKALOIDS-OPIUM 16.2-60 MG RE SUPP
RECTAL | Status: DC | PRN
Start: 2019-11-30 — End: 2019-11-30
  Administered 2019-11-30: 1 via RECTAL

## 2019-11-30 MED ORDER — KETOROLAC TROMETHAMINE 30 MG/ML IJ SOLN
INTRAMUSCULAR | Status: DC | PRN
  Administered 2019-11-30: 30 mg via INTRAVENOUS

## 2019-11-30 MED ORDER — FENTANYL CITRATE (PF) 100 MCG/2ML IJ SOLN
INTRAMUSCULAR | Status: DC | PRN
Start: 2019-11-30 — End: 2019-11-30
  Administered 2019-11-30 (×2): 50 ug via INTRAVENOUS

## 2019-11-30 MED ORDER — CHLORHEXIDINE GLUCONATE 0.12 % MT SOLN
OROMUCOSAL | Status: AC
  Administered 2019-11-30: 15 mL via OROMUCOSAL
  Filled 2019-11-30: qty 15

## 2019-11-30 MED ORDER — LIDOCAINE HCL (CARDIAC) PF 100 MG/5ML IV SOSY
PREFILLED_SYRINGE | INTRAVENOUS | Status: DC | PRN
  Administered 2019-11-30: 100 mg via INTRAVENOUS

## 2019-11-30 MED ORDER — CIPROFLOXACIN IN D5W 400 MG/200ML IV SOLN
400.0000 mg | INTRAVENOUS | Status: AC
  Administered 2019-11-30: 400 mg via INTRAVENOUS

## 2019-11-30 MED ORDER — PROPOFOL 10 MG/ML IV BOLUS
INTRAVENOUS | Status: DC | PRN
  Administered 2019-11-30: 100 mg via INTRAVENOUS

## 2019-11-30 MED ORDER — DEXAMETHASONE SODIUM PHOSPHATE 10 MG/ML IJ SOLN
INTRAMUSCULAR | Status: DC | PRN
  Administered 2019-11-30: 10 mg via INTRAVENOUS

## 2019-11-30 MED ORDER — CIPROFLOXACIN IN D5W 400 MG/200ML IV SOLN
INTRAVENOUS | Status: AC
  Filled 2019-11-30: qty 200

## 2019-11-30 MED ORDER — BELLADONNA ALKALOIDS-OPIUM 16.2-60 MG RE SUPP
RECTAL | Status: AC
  Filled 2019-11-30: qty 1

## 2019-11-30 MED ORDER — TAMSULOSIN HCL 0.4 MG PO CAPS: 0.4000 mg | ORAL_CAPSULE | Freq: Every day | ORAL | 1 refills | Status: AC

## 2019-11-30 MED ORDER — FENTANYL CITRATE (PF) 250 MCG/5ML IJ SOLN
INTRAMUSCULAR | Status: AC
  Filled 2019-11-30: qty 5

## 2019-11-30 MED ORDER — IOHEXOL 180 MG/ML  SOLN
INTRAMUSCULAR | Status: DC | PRN
  Administered 2019-11-30: 20 mL

## 2019-11-30 MED ORDER — ONDANSETRON HCL 4 MG/2ML IJ SOLN
INTRAMUSCULAR | Status: DC | PRN
  Administered 2019-11-30: 4 mg via INTRAVENOUS

## 2019-11-30 MED ORDER — MIDAZOLAM HCL 2 MG/2ML IJ SOLN
INTRAMUSCULAR | Status: AC
  Filled 2019-11-30: qty 2

## 2019-11-30 SURGICAL SUPPLY — 31 items
BAG DRAIN CYSTO-URO LG1000N (MISCELLANEOUS) ×2 IMPLANT
BRUSH SCRUB EZ 1% IODOPHOR (MISCELLANEOUS) ×2 IMPLANT
CATH URETL 5X70 OPEN END (CATHETERS) ×1 IMPLANT
CNTNR SPEC 2.5X3XGRAD LEK (MISCELLANEOUS)
CONT SPEC 4OZ STER OR WHT (MISCELLANEOUS)
CONT SPEC 4OZ STRL OR WHT (MISCELLANEOUS)
CONTAINER SPEC 2.5X3XGRAD LEK (MISCELLANEOUS) IMPLANT
DRAPE UTILITY 15X26 TOWEL STRL (DRAPES) ×2 IMPLANT
FIBER LASER TRAC TIP (UROLOGICAL SUPPLIES) ×1 IMPLANT
GLOVE BIOGEL PI IND STRL 7.5 (GLOVE) ×1 IMPLANT
GLOVE BIOGEL PI INDICATOR 7.5 (GLOVE) ×1
GOWN STRL REUS W/ TWL LRG LVL3 (GOWN DISPOSABLE) ×1 IMPLANT
GOWN STRL REUS W/ TWL XL LVL3 (GOWN DISPOSABLE) ×1 IMPLANT
GOWN STRL REUS W/TWL LRG LVL3 (GOWN DISPOSABLE) ×2
GOWN STRL REUS W/TWL XL LVL3 (GOWN DISPOSABLE) ×2
GUIDEWIRE STR DUAL SENSOR (WIRE) ×3 IMPLANT
INFUSOR MANOMETER BAG 3000ML (MISCELLANEOUS) ×2 IMPLANT
INTRODUCER DILATOR DOUBLE (INTRODUCER) ×1 IMPLANT
KIT TURNOVER CYSTO (KITS) ×2 IMPLANT
PACK CYSTO AR (MISCELLANEOUS) ×2 IMPLANT
SET CYSTO W/LG BORE CLAMP LF (SET/KITS/TRAYS/PACK) ×2 IMPLANT
SHEATH URETERAL 12FRX35CM (MISCELLANEOUS) IMPLANT
SOL .9 NS 3000ML IRR  AL (IV SOLUTION) ×1
SOL .9 NS 3000ML IRR AL (IV SOLUTION) ×1
SOL .9 NS 3000ML IRR UROMATIC (IV SOLUTION) ×1 IMPLANT
STENT URET 6FRX24 CONTOUR (STENTS) IMPLANT
STENT URET 6FRX26 CONTOUR (STENTS) ×1 IMPLANT
SURGILUBE 2OZ TUBE FLIPTOP (MISCELLANEOUS) ×2 IMPLANT
SYR 10ML LL (SYRINGE) ×2 IMPLANT
VALVE UROSEAL ADJ ENDO (VALVE) ×1 IMPLANT
WATER STERILE IRR 1000ML POUR (IV SOLUTION) ×2 IMPLANT

## 2019-11-30 NOTE — Anesthesia Procedure Notes (Signed)
Procedure Name: Intubation Date/Time: 11/30/2019 11:25 AM Performed by: Nelda Marseille, CRNA Pre-anesthesia Checklist: Patient identified, Patient being monitored, Timeout performed, Emergency Drugs available and Suction available Patient Re-evaluated:Patient Re-evaluated prior to induction Oxygen Delivery Method: Circle system utilized Preoxygenation: Pre-oxygenation with 100% oxygen Induction Type: IV induction Ventilation: Mask ventilation without difficulty Laryngoscope Size: Mac, 3 and McGraph Grade View: Grade I Tube type: Oral Tube size: 7.5 mm Number of attempts: 1 Airway Equipment and Method: Stylet Placement Confirmation: ETT inserted through vocal cords under direct vision,  positive ETCO2 and breath sounds checked- equal and bilateral Secured at: 21 cm Tube secured with: Tape Dental Injury: Teeth and Oropharynx as per pre-operative assessment

## 2019-11-30 NOTE — Interval H&P Note (Signed)
UROLOGY H&P UPDATE  Agree with prior H&P dated 11/26/2019.  74 year old male with 8 mm right proximal ureteral stone, and 2 other small renal stones.  He has been straining his urine and has not caught a stone, continues to have intermittent dull flank pain despite narcotics.  Cardiac: RRR Lungs: CTA bilaterally  Laterality: Right Procedure: Right ureteroscopy, laser lithotripsy, stent placement  Urine: Urinalysis 11/24/2019 11-20 RBCs, 0-5 WBCs, no bacteria, no leukocytes, nitrite negative  We specifically discussed the risks ureteroscopy including bleeding, infection/sepsis, stent related symptoms including flank pain/urgency/frequency/incontinence/dysuria, ureteral injury, inability to access stone, or need for staged or additional procedures.   [name], MD 11/30/2019

## 2019-11-30 NOTE — Op Note (Signed)
Date of procedure: 11/30/19  Preoperative diagnosis:  1. Right proximal ureteral stone 2. Right renal stones  Postoperative diagnosis:  1. Same  Procedure: 1. Cystoscopy, right retrograde pyelogram with intraoperative interpretation and right ureteral stent placement 2. Right ureteroscopy and laser lithotripsy of proximal ureteral stone 3. Right ureteroscopy and laser lithotripsy of multiple renal stones  Surgeon: Nickolas Madrid, MD  Anesthesia: General  Complications: None  Intraoperative findings:  1.  Normal cystoscopy, ureteral orifices orthotopic bilaterally, no suspicious lesions 2.  Uncomplicated dusting of all right-sided stones and stent placement  EBL: Minimal  Specimens: None  Drains: Right 6 French by 26 cm ureteral stent  Indication: Justin Warren is a 74 y.o. patient that presented with renal colic and an 8 mm right proximal ureteral stone with several other renal stones and opted for ureteroscopy for treatment.  After reviewing the management options for treatment, they elected to proceed with the above surgical procedure(s). We have discussed the potential benefits and risks of the procedure, side effects of the proposed treatment, the likelihood of the patient achieving the goals of the procedure, and any potential problems that might occur during the procedure or recuperation. Informed consent has been obtained.  Description of procedure:  The patient was taken to the operating room and general anesthesia was induced. SCDs were placed for DVT prophylaxis. The patient was placed in the dorsal lithotomy position, prepped and draped in the usual sterile fashion, and preoperative antibiotics(Ancef) were administered. A preoperative time-out was performed.   6 French rigid cystoscope was used to intubate the urethra and a normal-appearing urethra was followed proximally into the bladder.  The prostate was moderate in size.  Thorough cystoscopy was performed and the  ureteral orifices were orthotopic bilaterally and there were no suspicious bladder findings.  I performed a retrograde pyelogram on the right side which showed a filling defect in the proximal ureter consistent with his known 8 mm stone.  A sensor wire advanced easily alongside the stone up into the kidney, and a dual-lumen access catheter was used to add a second safety sensor wire.  A single channel digital flexible ureteroscope was advanced over the wire up to the stone and I identified a hard black stone in the proximal ureter.  This was fragmented to dust using a 200 m laser fiber on settings of 0.5 J and 40 Hz.  I then advanced the ureteroscope up into the kidney and thorough pyeloscopy revealed two 4-41m stones in the midpole.  These were also fragmented to dust.  Thorough pyeloscopy revealed no other lesions.    A retrograde pyelogram was performed from the proximal ureter and showed no filling defects or extravasation.  Careful pullback ureteroscopy demonstrated no residual fragments or ureteral injury.  A rigid cystoscope was backloaded over the wire and a 6 [place] 26 cm ureteral stent was uneventfully placed with a shepherd's hook in the upper pole, and an excellent curl in the bladder.  There is brisk drainage of contrast through the side ports of the stent.  The bladder was drained, belladonna suppository was placed, and this concluded our procedure.  Disposition: Stable to PACU  Plan: Stent removal in 1 week  BNickolas Madrid MD

## 2019-11-30 NOTE — Transfer of Care (Signed)
Immediate Anesthesia Transfer of Care Note  Patient: Justin Warren  Procedure(s) Performed: CYSTOSCOPY/URETEROSCOPY/HOLMIUM LASER/STENT PLACEMENT (Right Ureter) CYSTOSCOPY WITH RETROGRADE PYELOGRAM (Right Ureter)  Patient Location: PACU  Anesthesia Type:General  Level of Consciousness: awake and sedated  Airway & Oxygen Therapy: Patient Spontanous Breathing and Patient connected to face mask oxygen  Post-op Assessment: Report given to RN and Post -op Vital signs reviewed and stable  Post vital signs: Reviewed and stable  Last Vitals:  Vitals Value Taken Time  BP    Temp    Pulse    Resp    SpO2      Last Pain:  Vitals:   11/30/19 1207  TempSrc:   PainSc: Asleep         Complications: No complications documented.

## 2019-11-30 NOTE — Discharge Instructions (Signed)

## 2019-11-30 NOTE — Anesthesia Preprocedure Evaluation (Addendum)
Anesthesia Evaluation  Patient identified by MRN, date of birth, ID band Patient awake    Reviewed: Allergy & Precautions, H&P , NPO status , Patient's Chart, lab work & pertinent test results, reviewed documented beta blocker date and time   History of Anesthesia Complications Negative for: history of anesthetic complications  Airway Mallampati: III  TM Distance: >3 FB Neck ROM: full    Dental  (+) Dental Advidsory Given, Caps, Teeth Intact   Pulmonary shortness of breath, neg sleep apnea, neg COPD, neg recent URI, former smoker,           Cardiovascular Exercise Tolerance: Good (-) hypertension(-) angina+ Peripheral Vascular Disease  (-) CAD, (-) Past MI, (-) Cardiac Stents and (-) CABG + dysrhythmias (bradycardia) + pacemaker (-) Valvular Problems/Murmurs     Neuro/Psych PSYCHIATRIC DISORDERS Anxiety Depression Foot drop  Neuromuscular disease    GI/Hepatic Neg liver ROS, GERD  ,  Endo/Other  negative endocrine ROS  Renal/GU negative Renal ROS  negative genitourinary   Musculoskeletal  (+) Arthritis ,   Abdominal   Peds negative pediatric ROS (+)  Hematology negative hematology ROS (+)   Anesthesia Other Findings Past Medical History: No date: GERD (gastroesophageal reflux disease)   Reproductive/Obstetrics negative OB ROS                             Anesthesia Physical  Anesthesia Plan  ASA: III  Anesthesia Plan: General   Post-op Pain Management:    Induction: Intravenous  PONV Risk Score and Plan:   Airway Management Planned: Oral ETT  Additional Equipment:   Intra-op Plan:   Post-operative Plan: Extubation in OR  Informed Consent: I have reviewed the patients History and Physical, chart, labs and discussed the procedure including the risks, benefits and alternatives for the proposed anesthesia with the patient or authorized representative who has indicated his/her  understanding and acceptance.     Dental Advisory Given  Plan Discussed with: Anesthesiologist, CRNA and Surgeon  Anesthesia Plan Comments:        Anesthesia Quick Evaluation

## 2019-12-02 NOTE — Anesthesia Postprocedure Evaluation (Signed)
Anesthesia Post Note  Patient: Justin Warren  Procedure(s) Performed: CYSTOSCOPY/URETEROSCOPY/HOLMIUM LASER/STENT PLACEMENT (Right Ureter) CYSTOSCOPY WITH RETROGRADE PYELOGRAM (Right Ureter)  Patient location during evaluation: PACU Anesthesia Type: General Level of consciousness: awake and alert and oriented Pain management: pain level controlled Vital Signs Assessment: post-procedure vital signs reviewed and stable Respiratory status: spontaneous breathing Cardiovascular status: blood pressure returned to baseline Anesthetic complications: no   No complications documented.   Last Vitals:  Vitals:   11/30/19 1330 11/30/19 1354  BP: (!) 157/76 (!) 157/69  Pulse: (!) 59 60  Resp:  16  Temp:    SpO2: 100% 96%    Last Pain:  Vitals:   11/30/19 1354  TempSrc:   PainSc: 0-No pain                 Teyona Nichelson

## 2019-12-06 ENCOUNTER — Ambulatory Visit (INDEPENDENT_AMBULATORY_CARE_PROVIDER_SITE_OTHER): Payer: Medicare HMO | Admitting: Urology

## 2019-12-06 ENCOUNTER — Encounter: Payer: Self-pay | Admitting: Urology

## 2019-12-06 ENCOUNTER — Other Ambulatory Visit: Payer: Self-pay

## 2019-12-06 VITALS — BP 157/91 | HR 94 | Ht 69.0 in | Wt 193.0 lb

## 2019-12-06 DIAGNOSIS — Z466 Encounter for fitting and adjustment of urinary device: Secondary | ICD-10-CM | POA: Diagnosis not present

## 2019-12-06 DIAGNOSIS — N200 Calculus of kidney: Secondary | ICD-10-CM

## 2019-12-06 MED ORDER — SULFAMETHOXAZOLE-TRIMETHOPRIM 800-160 MG PO TABS
1.0000 | ORAL_TABLET | Freq: Once | ORAL | Status: AC
  Administered 2019-12-06: 1 via ORAL

## 2019-12-06 MED ORDER — LIDOCAINE HCL URETHRAL/MUCOSAL 2 % EX GEL
1.0000 "application " | Freq: Once | CUTANEOUS | Status: AC
  Administered 2019-12-06: 1 via URETHRAL

## 2019-12-06 MED ORDER — SULFAMETHOXAZOLE-TRIMETHOPRIM 800-160 MG PO TABS: 1.0000 | ORAL_TABLET | Freq: Two times a day (BID) | ORAL | Status: DC

## 2019-12-06 NOTE — Patient Instructions (Signed)
Dietary Guidelines to Help Prevent Kidney Stones Kidney stones are deposits of minerals and salts that form inside your kidneys. Your risk of developing kidney stones may be greater depending on your diet, your lifestyle, the medicines you take, and whether you have certain medical conditions. Most people can reduce their chances of developing kidney stones by following the instructions below. Depending on your overall health and the type of kidney stones you tend to develop, your dietitian may give you more specific instructions. What are tips for following this plan? Reading food labels  Choose foods with "no salt added" or "low-salt" labels. Limit your sodium intake to less than 1500 mg per day.  Choose foods with calcium for each meal and snack. Try to eat about 300 mg of calcium at each meal. Foods that contain 200-500 mg of calcium per serving include: ? 8 oz (237 ml) of milk, fortified nondairy milk, and fortified fruit juice. ? 8 oz (237 ml) of kefir, yogurt, and soy yogurt. ? 4 oz (118 ml) of tofu. ? 1 oz of cheese. ? 1 cup (300 g) of dried figs. ? 1 cup (91 g) of cooked broccoli. ? 1-3 oz can of sardines or mackerel.  Most people need 1000 to 1500 mg of calcium each day. Talk to your dietitian about how much calcium is recommended for you. Shopping  Buy plenty of fresh fruits and vegetables. Most people do not need to avoid fruits and vegetables, even if they contain nutrients that may contribute to kidney stones.  When shopping for convenience foods, choose: ? Whole pieces of fruit. ? Premade salads with dressing on the side. ? Low-fat fruit and yogurt smoothies.  Avoid buying frozen meals or prepared deli foods.  Look for foods with live cultures, such as yogurt and kefir. Cooking  Do not add salt to food when cooking. Place a salt shaker on the table and allow each person to add his or her own salt to taste.  Use vegetable protein, such as beans, textured vegetable  protein (TVP), or tofu instead of meat in pasta, casseroles, and soups. Meal planning   Eat less salt, if told by your dietitian. To do this: ? Avoid eating processed or premade food. ? Avoid eating fast food.  Eat less animal protein, including cheese, meat, poultry, or fish, if told by your dietitian. To do this: ? Limit the number of times you have meat, poultry, fish, or cheese each week. Eat a diet free of meat at least 2 days a week. ? Eat only one serving each day of meat, poultry, fish, or seafood. ? When you prepare animal protein, cut pieces into small portion sizes. For most meat and fish, one serving is about the size of one deck of cards.  Eat at least 5 servings of fresh fruits and vegetables each day. To do this: ? Keep fruits and vegetables on hand for snacks. ? Eat 1 piece of fruit or a handful of berries with breakfast. ? Have a salad and fruit at lunch. ? Have two kinds of vegetables at dinner.  Limit foods that are high in a substance called oxalate. These include: ? Spinach. ? Rhubarb. ? Beets. ? Potato chips and french fries. ? Nuts.  If you regularly take a diuretic medicine, make sure to eat at least 1-2 fruits or vegetables high in potassium each day. These include: ? Avocado. ? Banana. ? Orange, prune, carrot, or tomato juice. ? Baked potato. ? Cabbage. ? Beans and split   peas. General instructions   Drink enough fluid to keep your urine clear or pale yellow. This is the most important thing you can do.  Talk to your health care provider and dietitian about taking daily supplements. Depending on your health and the cause of your kidney stones, you may be advised: ? Not to take supplements with vitamin C. ? To take a calcium supplement. ? To take a daily probiotic supplement. ? To take other supplements such as magnesium, fish oil, or vitamin B6.  Take all medicines and supplements as told by your health care provider.  Limit alcohol intake to no  more than 1 drink a day for nonpregnant women and 2 drinks a day for men. One drink equals 12 oz of beer, 5 oz of wine, or 1 oz of hard liquor.  Lose weight if told by your health care provider. Work with your dietitian to find strategies and an eating plan that works best for you. What foods are not recommended? Limit your intake of the following foods, or as told by your dietitian. Talk to your dietitian about specific foods you should avoid based on the type of kidney stones and your overall health. Grains Breads. Bagels. Rolls. Baked goods. Salted crackers. Cereal. Pasta. Vegetables Spinach. Rhubarb. Beets. Canned vegetables. Pickles. Olives. Meats and other protein foods Nuts. Nut butters. Large portions of meat, poultry, or fish. Salted or cured meats. Deli meats. Hot dogs. Sausages. Dairy Cheese. Beverages Regular soft drinks. Regular vegetable juice. Seasonings and other foods Seasoning blends with salt. Salad dressings. Canned soups. Soy sauce. Ketchup. Barbecue sauce. Canned pasta sauce. Casseroles. Pizza. Lasagna. Frozen meals. Potato chips. French fries. Summary  You can reduce your risk of kidney stones by making changes to your diet.  The most important thing you can do is drink enough fluid. You should drink enough fluid to keep your urine clear or pale yellow.  Ask your health care provider or dietitian how much protein from animal sources you should eat each day, and also how much salt and calcium you should have each day. This information is not intended to replace advice given to you by your health care provider. Make sure you discuss any questions you have with your health care provider. Document Revised: 07/05/2018 Document Reviewed: 02/24/2016 Elsevier Patient Education  2020 [employer]  

## 2019-12-06 NOTE — Progress Notes (Signed)
Cystoscopy Procedure Note:  Indication: Stent removal s/p 11/30/2019 right ureteroscopy laser lithotripsy for 8 mm proximal ureteral and multiple small renal stones  After informed consent and discussion of the procedure and its risks, MALIEK SCHELLHORN was positioned and prepped in the standard fashion. Cystoscopy was performed with a flexible cystoscope. The stent was grasped with flexible graspers and removed in its entirety. The patient tolerated the procedure well.  Findings: Uncomplicated stent removal  Assessment and Plan: Follow up in 4 weeks with renal ultrasound to evaluate for silent hydronephrosis  We discussed general stone prevention strategies including adequate hydration with goal of producing 2.5 L of urine daily, increasing citric acid intake, increasing calcium intake during high oxalate meals, minimizing animal protein, and decreasing salt intake. Information about dietary recommendations given today.     [name], MD 12/06/2019

## 2019-12-07 LAB — URINALYSIS, COMPLETE
Bilirubin, UA: NEGATIVE
Glucose, UA: NEGATIVE
Ketones, UA: NEGATIVE
Nitrite, UA: NEGATIVE
Specific Gravity, UA: 1.015 (ref 1.005–1.030)
Urobilinogen, Ur: 0.2 mg/dL (ref 0.2–1.0)
pH, UA: 6.5 (ref 5.0–7.5)

## 2019-12-07 LAB — MICROSCOPIC EXAMINATION

## 2019-12-11 DIAGNOSIS — I442 Atrioventricular block, complete: Secondary | ICD-10-CM | POA: Diagnosis not present

## 2019-12-25 DIAGNOSIS — R001 Bradycardia, unspecified: Secondary | ICD-10-CM | POA: Diagnosis not present

## 2019-12-25 DIAGNOSIS — I739 Peripheral vascular disease, unspecified: Secondary | ICD-10-CM | POA: Diagnosis not present

## 2019-12-25 DIAGNOSIS — I442 Atrioventricular block, complete: Secondary | ICD-10-CM | POA: Diagnosis not present

## 2019-12-25 DIAGNOSIS — R0789 Other chest pain: Secondary | ICD-10-CM | POA: Diagnosis not present

## 2019-12-25 DIAGNOSIS — I4589 Other specified conduction disorders: Secondary | ICD-10-CM | POA: Diagnosis not present

## 2019-12-25 DIAGNOSIS — E785 Hyperlipidemia, unspecified: Secondary | ICD-10-CM | POA: Diagnosis not present

## 2019-12-27 ENCOUNTER — Ambulatory Visit: Payer: Self-pay

## 2019-12-27 NOTE — Telephone Encounter (Signed)
Pt is calling and would like to know how far apart the booster and flu shot should be. Please advise      Instructed pt. He can receive vaccines at the same time. Verbalizes understanding.

## 2019-12-31 IMAGING — CR DG CHEST 2V
2 series · 2 of 2 positions shown · non-contrast
Comparison: 09/07/2017

CLINICAL DATA: Preop for pacemaker

EXAM:
CHEST - 2 VIEW

[chest pa]
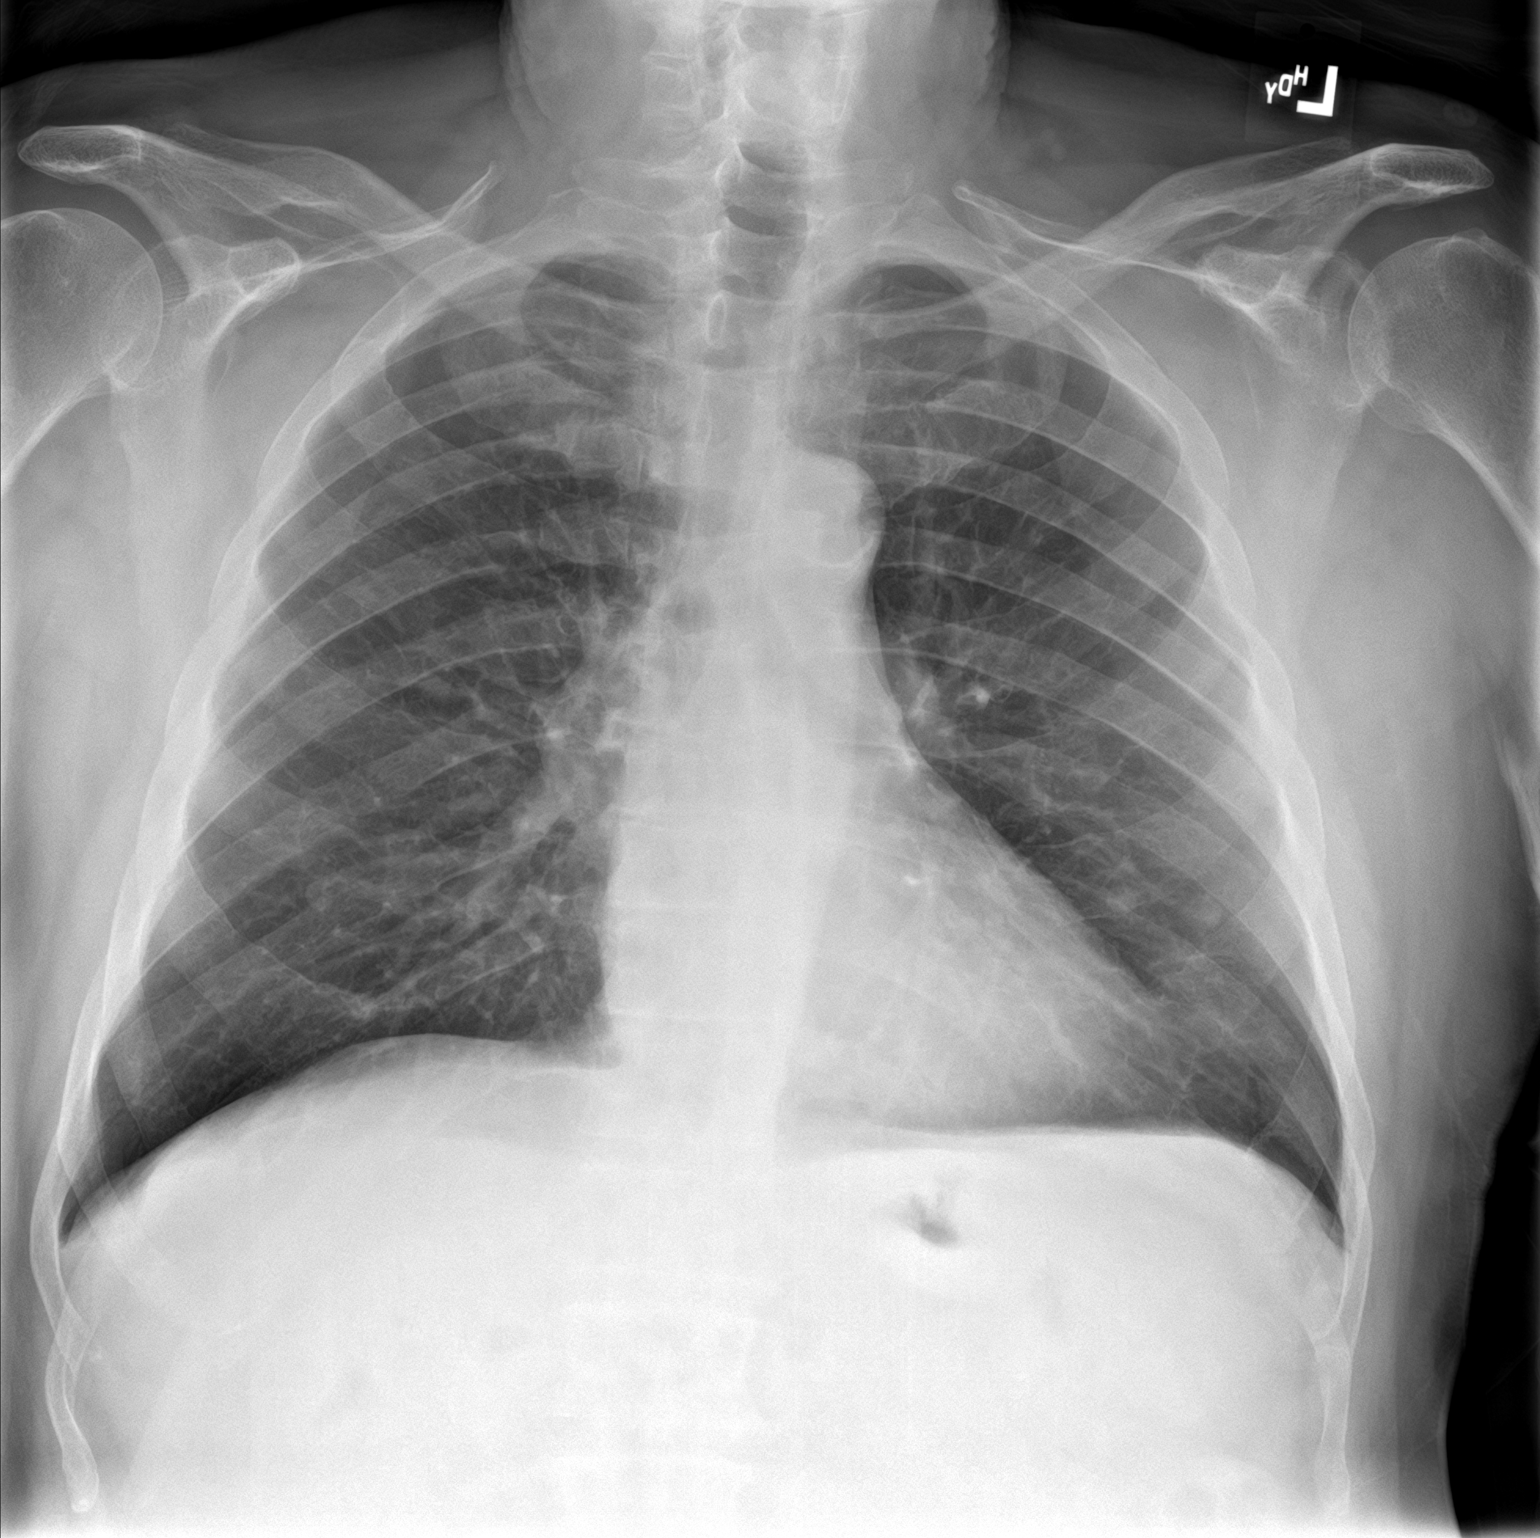

[chest lat]
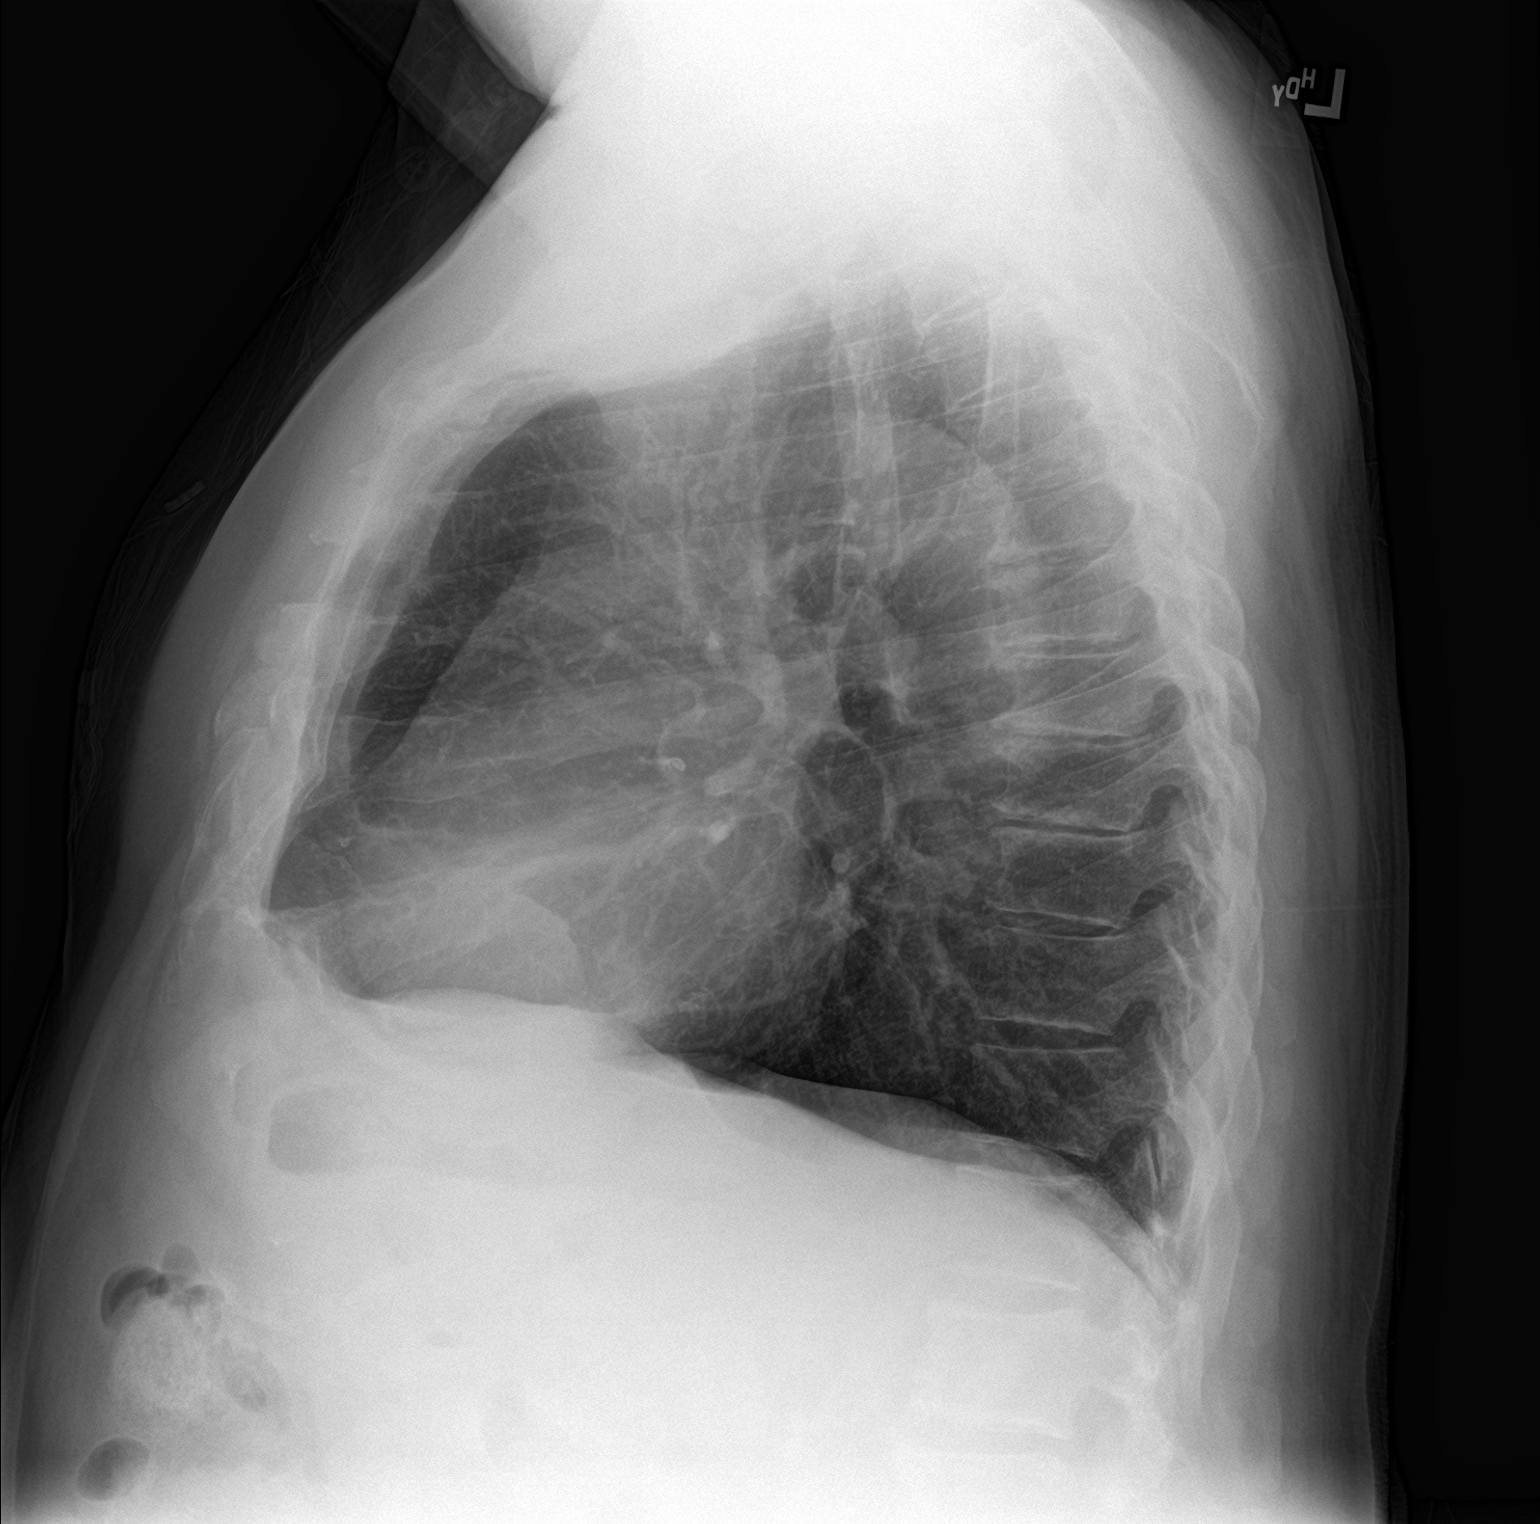

[2 of 2 positions shown; findings below may reference images not displayed]

FINDINGS: Multiple subcentimeter nodular densities have developed at the lung
bases. At least 3 nodular densities are visualized. Lungs are
otherwise clear. No pneumothorax or pleural effusion.
IMPRESSION: New bibasilar nodular densities.  CT is recommended.

## 2020-01-06 IMAGING — DX DG CHEST 1V PORT
2 series · 2 of 2 positions shown · non-contrast
Comparison: none

CLINICAL DATA: Cardiac pacer insertion.

EXAM:
PORTABLE CHEST 1 VIEW

[chest ap (1 of 2)]
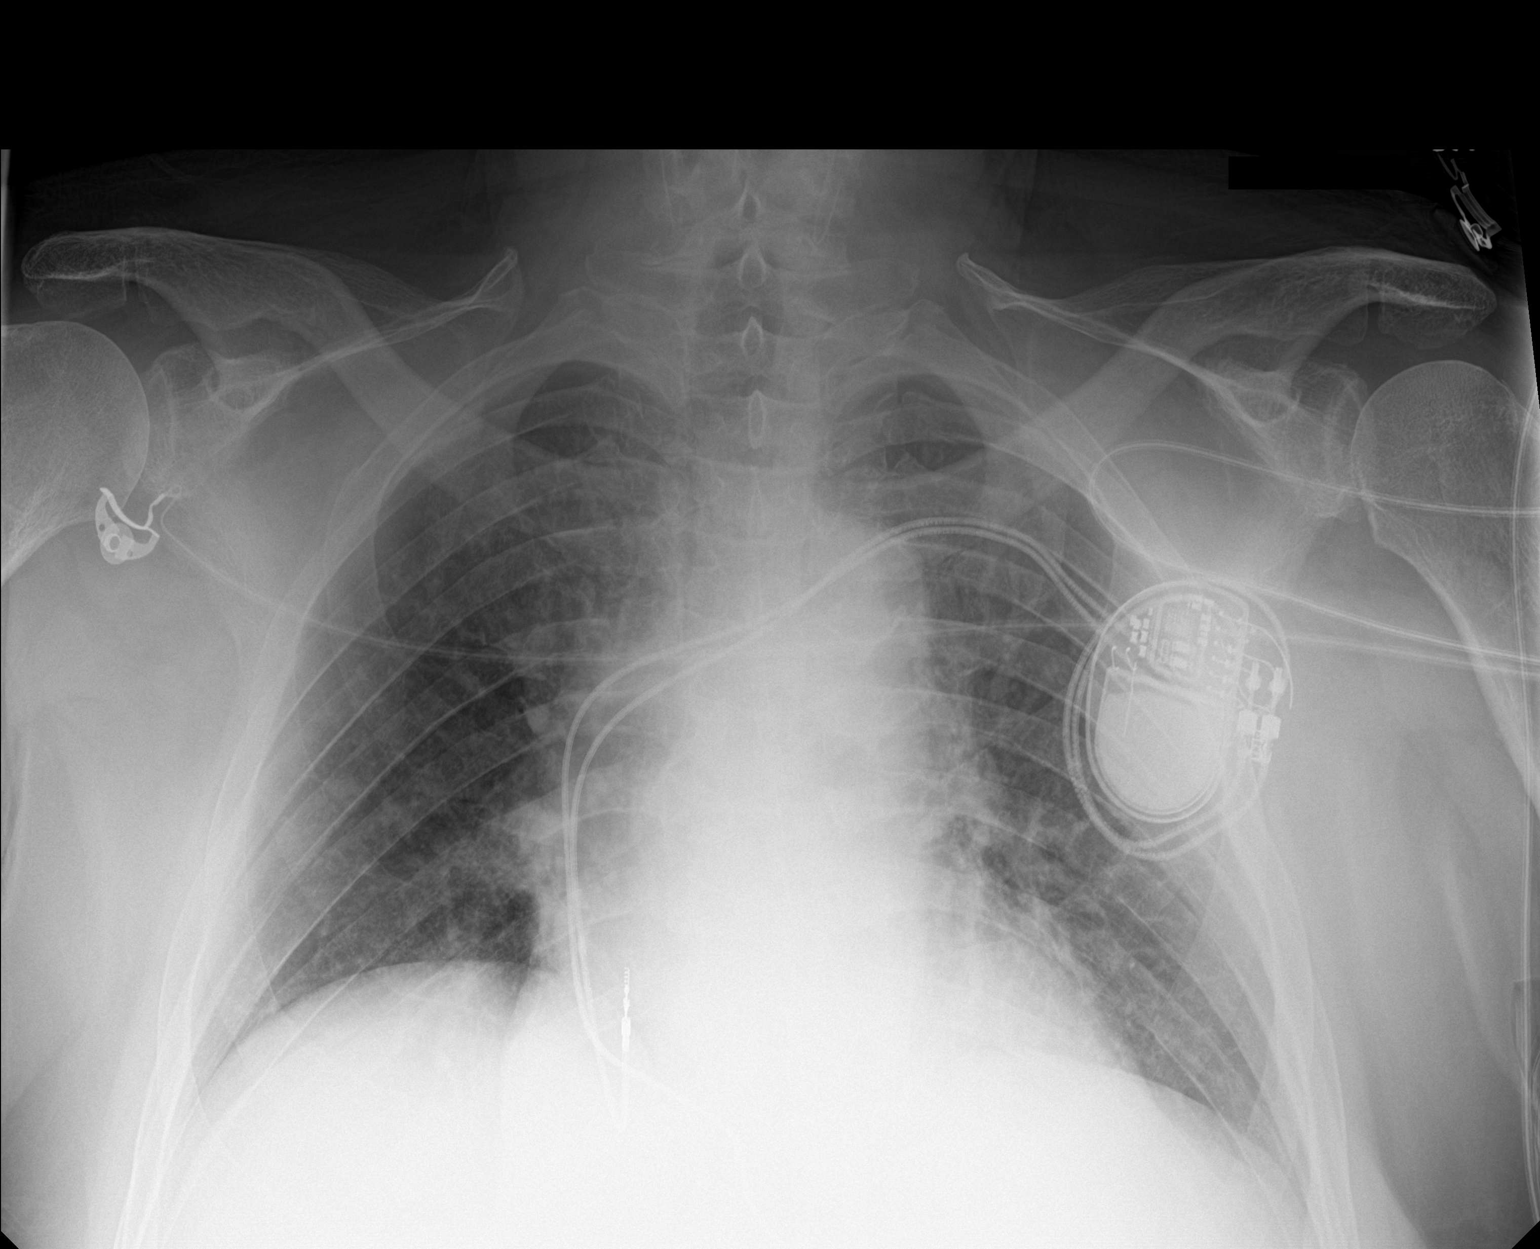

[chest ap (2 of 2)]
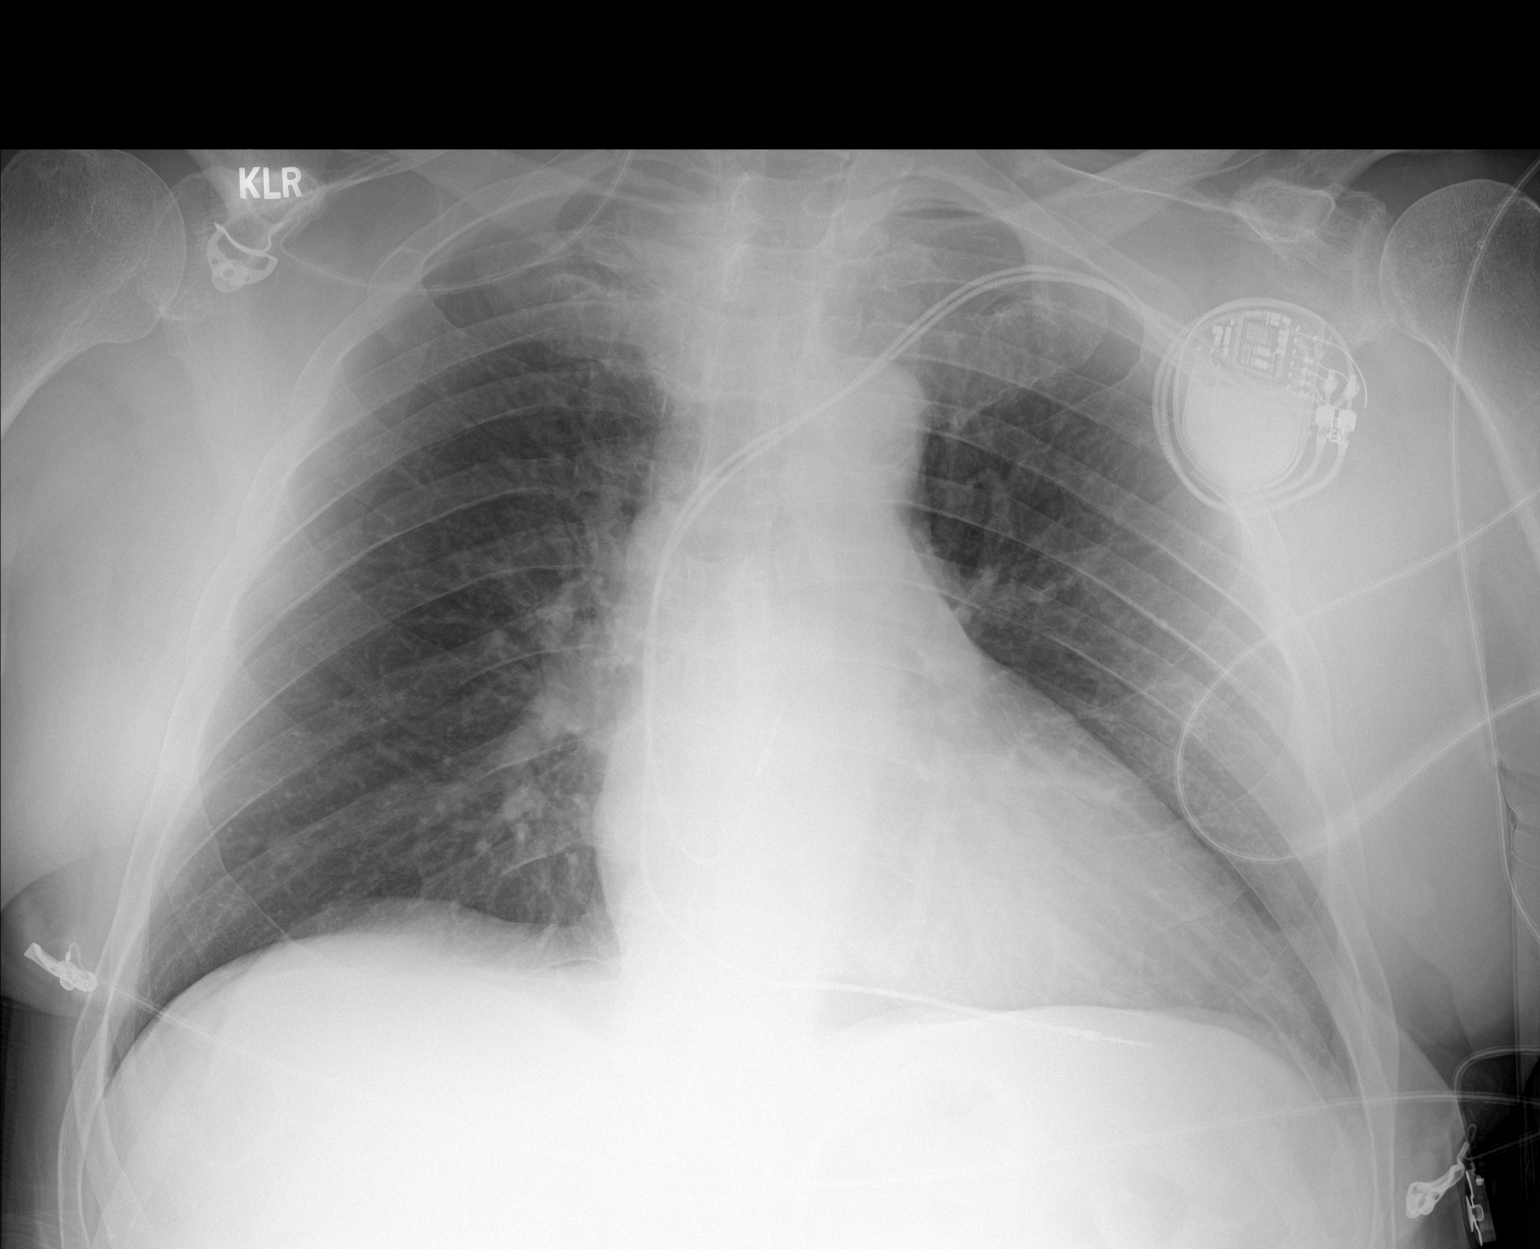

[2 of 2 positions shown; findings below may reference images not displayed]

FINDINGS: Cardiac pacer with lead tips over the right atrium and right
ventricle. No evidence of pneumothorax. Cardiomegaly. No pulmonary
venous congestion. No focal infiltrate. No pleural effusion or
pneumothorax.
IMPRESSION: Cardiac pacer with lead tips over the right atrium and right
ventricle. Cardiomegaly. No pulmonary venous congestion. No acute
pulmonary disease. No pneumothorax.

## 2020-01-07 ENCOUNTER — Ambulatory Visit
Admission: RE | Admit: 2020-01-07 | Discharge: 2020-01-07 | Disposition: A | Discharge status: Home / Self Care | Payer: Medicare HMO | Source: Ambulatory Visit | Attending: Urology | Admitting: Urology

## 2020-01-07 ENCOUNTER — Other Ambulatory Visit: Payer: Self-pay

## 2020-01-07 DIAGNOSIS — N200 Calculus of kidney: Secondary | ICD-10-CM | POA: Diagnosis not present

## 2020-01-14 ENCOUNTER — Other Ambulatory Visit (INDEPENDENT_AMBULATORY_CARE_PROVIDER_SITE_OTHER): Payer: Self-pay | Admitting: Vascular Surgery

## 2020-01-14 DIAGNOSIS — R209 Unspecified disturbances of skin sensation: Secondary | ICD-10-CM

## 2020-01-15 ENCOUNTER — Other Ambulatory Visit: Payer: Self-pay

## 2020-01-15 ENCOUNTER — Ambulatory Visit (INDEPENDENT_AMBULATORY_CARE_PROVIDER_SITE_OTHER): Payer: Medicare HMO | Admitting: Vascular Surgery

## 2020-01-15 ENCOUNTER — Ambulatory Visit (INDEPENDENT_AMBULATORY_CARE_PROVIDER_SITE_OTHER): Payer: Medicare HMO

## 2020-01-15 ENCOUNTER — Encounter (INDEPENDENT_AMBULATORY_CARE_PROVIDER_SITE_OTHER): Payer: Self-pay | Admitting: Vascular Surgery

## 2020-01-15 VITALS — BP 182/68 | HR 62 | Resp 16 | Ht 69.0 in | Wt 195.0 lb

## 2020-01-15 DIAGNOSIS — R231 Pallor: Secondary | ICD-10-CM

## 2020-01-15 DIAGNOSIS — R200 Anesthesia of skin: Secondary | ICD-10-CM

## 2020-01-15 DIAGNOSIS — R209 Unspecified disturbances of skin sensation: Secondary | ICD-10-CM | POA: Diagnosis not present

## 2020-01-15 DIAGNOSIS — E7800 Pure hypercholesterolemia, unspecified: Secondary | ICD-10-CM

## 2020-01-15 DIAGNOSIS — R202 Paresthesia of skin: Secondary | ICD-10-CM

## 2020-01-15 NOTE — Assessment & Plan Note (Signed)
ABIs today were normal at 1.03 on the right and 1.19 on the left with brisk triphasic waveforms and normal digital pressures consistent with no arterial insufficiency.  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

## 2020-01-15 NOTE — Assessment & Plan Note (Signed)
lipid control important in reducing the progression of atherosclerotic disease. Continue statin therapy  

## 2020-01-15 NOTE — Assessment & Plan Note (Signed)
ABIs today were normal at 1.03 on the right and 1.19 on the left with brisk triphasic waveforms and normal digital pressures consistent with no arterial insufficiency.  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  No arterial insufficiency.  Can return to clinic as needed.

## 2020-01-15 NOTE — Progress Notes (Signed)
Patient ID: Justin Warren, male   DOB: 17-Oct-1945, 74 y.o.   MRN: [id number]  Chief Complaint  Patient presents with  . New Patient (Initial Visit)    ref [name] cold feet    HPI ICHAEL Warren is a 74 y.o. male.  I am asked to see the patient by Dr. [name] for evaluation of PAD.  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  He is wearing foot drop splints and that has helped his walking.  He still has a lot of weakness and difficulty with walking.  In addition to this, his feet are very cold both to actual touch as well as perceptively.  For further evaluation of this noninvasive studies were done today to assess his perfusion.  ABIs today were normal at 1.03 on the right and 1.19 on the left with brisk triphasic waveforms and normal digital pressures consistent with no arterial insufficiency.     Past Medical History:  Diagnosis Date  . Anxiety   . Bradycardia   . GERD (gastroesophageal reflux disease)   . History of kidney stones   . Hyperlipidemia   . Kidney stone     Past Surgical History:  Procedure Laterality Date  . APPENDECTOMY  1994  . BACK SURGERY    . CARPAL TUNNEL RELEASE Right 2012  . CATARACT EXTRACTION, BILATERAL  2019  . CYSTOSCOPY W/ RETROGRADES Right 11/30/2019   Procedure: CYSTOSCOPY WITH RETROGRADE PYELOGRAM;  Surgeon: [name], MD;  Location: [facility] ORS;  Service: Urology;  Laterality: Right;  . CYSTOSCOPY/URETEROSCOPY/HOLMIUM LASER/STENT PLACEMENT Right 11/30/2019   Procedure: CYSTOSCOPY/URETEROSCOPY/HOLMIUM LASER/STENT PLACEMENT;  Surgeon: [name], MD;  Location: [facility] ORS;  Service: Urology;  Laterality: Right;  . EYE SURGERY    . INSERT / REPLACE / REMOVE PACEMAKER    . JOINT REPLACEMENT    . LUMBAR LAMINECTOMIES  1976 & 1978   Dr. [name]  . PACEMAKER INSERTION Left 04/20/2018   Procedure: INSERTION PACEMAKER-DUAL CHAMBER;  Surgeon: [name], MD;  Location: [facility] ORS;  Service: Cardiovascular;   Laterality: Left;  . 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 SIGMOIDOSCOPY  2010     Family History  Problem Relation Age of Onset  . Dementia Mother   . GER disease Mother   . Hypertension Father   . Arrhythmia Sister   . Heart murmur Sister      Social History   Tobacco Use  . Smoking status: Former Smoker    Packs/day: 2.00    Years: 30.00    Pack years: 60.00    Types: Cigarettes  . Smokeless tobacco: Never Used  . Tobacco comment: quit in 1991-1992  Vaping Use  . Vaping Use: Never used  Substance Use Topics  . Alcohol use: Not Currently    Alcohol/week: 0.0 standard drinks    Comment: quit over 30 years ago  . Drug use: No    Allergies  Allergen Reactions  . Amoxicillin-Pot Clavulanate Nausea And Vomiting    DID THE REACTION INVOLVE: Swelling of the face/tongue/throat, SOB, or low BP? No Sudden or severe rash/hives, skin peeling, or the inside of the mouth or nose? No Did it require medical treatment? No When did it last happen?Over 10 years ago If all above answers are "NO", may proceed with cephalosporin use.    Current Outpatient Medications  Medication Sig Dispense Refill  . acetaminophen (TYLENOL) 650 MG CR tablet Take 650 mg by mouth every 8 (eight) hours as needed  for pain.    . busPIRone (BUSPAR) 15 MG tablet Take 1 tablet (15 mg total) by mouth 2 (two) times daily. 180 tablet 2  . donepezil (ARICEPT) 5 MG tablet Take 5 mg by mouth at bedtime.     . famotidine (PEPCID) 20 MG tablet Take 1 tablet (20 mg total) by mouth 2 (two) times daily. 180 tablet 2  . FLUoxetine (PROZAC) 20 MG capsule Take 1 capsule (20 mg total) by mouth daily. 90 capsule 3  . meloxicam (MOBIC) 15 MG tablet Take 15 mg by mouth daily.     . simvastatin (ZOCOR) 20 MG tablet Take 1 tablet (20 mg total) by mouth daily. 90 tablet 3  . Apple Cider Vinegar 500 MG TABS Take 500 mg by mouth 2 (two) times daily.  (Patient not taking: Reported on 12/06/2019)     No current  facility-administered medications for this visit.      REVIEW OF SYSTEMS (Negative unless checked)  Constitutional: [] Weight loss  [] Fever  [] Chills Cardiac: [] Chest pain   [] Chest pressure   [] Palpitations   [] Shortness of breath when laying flat   [] Shortness of breath at rest   [] Shortness of breath with exertion. Vascular:  [] Pain in legs with walking   [] Pain in legs at rest   [] Pain in legs when laying flat   [] Claudication   [] Pain in feet when walking  [] Pain in feet at rest  [] Pain in feet when laying flat   [] History of DVT   [] Phlebitis   [] Swelling in legs   [] Varicose veins   [] Non-healing ulcers Pulmonary:   [] Uses home oxygen   [] Productive cough   [] Hemoptysis   [] Wheeze  [] COPD   [] Asthma Neurologic:  [] Dizziness  [] Blackouts   [] Seizures   [] History of stroke   [] History of TIA  [] Aphasia   [] Temporary blindness   [] Dysphagia   [] Weakness or numbness in arms   [x] Weakness or numbness in legs Musculoskeletal:  [] Arthritis   [] Joint swelling   [] Joint pain   [x] Low back pain Hematologic:  [] Easy bruising  [] Easy bleeding   [] Hypercoagulable state   [] Anemic  [] Hepatitis Gastrointestinal:  [] Blood in stool   [] Vomiting blood  [x] Gastroesophageal reflux/heartburn   [] Abdominal pain Genitourinary:  [] Chronic kidney disease   [] Difficult urination  [] Frequent urination  [] Burning with urination   [] Hematuria Skin:  [] Rashes   [] Ulcers   [] Wounds Psychological:  [x] History of anxiety   []  History of major depression.    Physical Exam BP (!) 182/68 (BP Location: Right Arm)   Pulse 62   Resp 16   Ht 5' 9"  (1.753 m)   Wt 195 lb (88.5 kg)   BMI 28.80 kg/m  Gen:  WD/WN, NAD Head: [place]/AT, No temporalis wasting.  Ear/Nose/Throat: Hearing grossly intact, nares w/o erythema or drainage, oropharynx w/o Erythema/Exudate Eyes: Conjunctiva clear, sclera non-icteric  Neck: trachea midline.  No JVD.  Pulmonary:  Good air movement, respirations not labored, no use of accessory muscles    Cardiac: RRR, no JVD Vascular:  Vessel Right Left  Radial Palpable Palpable                          DP  palpable  palpable  PT  palpable  palpable   Gastrointestinal:. No masses, surgical incisions, or scars. Musculoskeletal:   Extremities without ischemic changes.  No deformity or atrophy.  Wearing foot drop splints.  Feet are slightly cool to the touch.  No edema. Neurologic:  Sensation grossly intact in extremities.  Symmetrical.  Speech is fluent.  Psychiatric: Judgment intact, Mood & affect appropriate for pt's clinical situation. Dermatologic: No rashes or ulcers noted.  No cellulitis or open wounds.    Radiology Ultrasound renal complete  Result Date: 01/08/2020 CLINICAL DATA:  Nephrolithiasis EXAM: RENAL / URINARY TRACT ULTRASOUND COMPLETE COMPARISON:  CT abdomen pelvis 11/24/2019 FINDINGS: Right Kidney: Renal measurements: 10.9 x 4.6 x 5.3 cm = volume: 137 mL. Echogenicity within normal limits. No mass or hydronephrosis visualized. There is a shadowing calculus in the mid lateral kidney measuring 0.8 cm. Left Kidney: Renal measurements: 11.5 x 5.3 x 5.3 cm = volume: 169 mL. Echogenicity within normal limits. No mass or hydronephrosis visualized. There is a shadowing calculus in the superior pole measuring 0.9 cm. Bladder: Appears normal for degree of bladder distention. Other: None. IMPRESSION: Bilateral nephrolithiasis.  No hydronephrosis. Electronically Signed   By: Audie Pinto M.D.   On: 01/08/2020 09:47   VAS [place] ABI WITH/WO TBI  Result Date: 01/15/2020 LOWER EXTREMITY DOPPLER STUDY Indications: Bilateral cold feet.  Performing Technologist: Charlane Ferretti RT (R)(VS)  Examination Guidelines: A complete evaluation includes at minimum, Doppler waveform signals and systolic blood pressure reading at the level of bilateral brachial, anterior tibial, and posterior tibial arteries, when vessel segments are accessible. Bilateral testing is considered an integral part of a  complete examination. Photoelectric Plethysmograph (PPG) waveforms and toe systolic pressure readings are included as required and additional duplex testing as needed. Limited examinations for reoccurring indications may be performed as noted.  ABI Findings: +---------+------------------+-----+---------+--------+ Right    Rt Pressure (mmHg)IndexWaveform Comment  +---------+------------------+-----+---------+--------+ Brachial 169                                      +---------+------------------+-----+---------+--------+ ATA      191               1.03 triphasic         +---------+------------------+-----+---------+--------+ PTA      191               1.03 triphasic         +---------+------------------+-----+---------+--------+ Great Toe131               0.71 Normal            +---------+------------------+-----+---------+--------+ +---------+------------------+-----+---------+-------+ Left     Lt Pressure (mmHg)IndexWaveform Comment +---------+------------------+-----+---------+-------+ Brachial 185                                     +---------+------------------+-----+---------+-------+ ATA      211               1.14 triphasic        +---------+------------------+-----+---------+-------+ PTA      220               1.19 triphasic        +---------+------------------+-----+---------+-------+ Great Toe163               0.88 Normal           +---------+------------------+-----+---------+-------+ Summary: Right: Resting right ankle-brachial index is within normal range. No evidence of significant right lower extremity arterial disease. The right toe-brachial index is normal. Left: Resting left ankle-brachial index is within normal range. No evidence of significant left lower extremity arterial disease. The left  toe-brachial index is normal. *See table(s) above for measurements and observations.  Electronically signed by Leotis Pain MD on 01/15/2020 at 8:34:01  AM.   Final     Labs Recent Results (from the past 2160 hour(s))  Basic metabolic panel     Status: Abnormal   Collection Time: 11/24/19  4:25 PM  Result Value Ref Range   Sodium 134 (L) 135 - 145 mmol/L   Potassium 3.2 (L) 3.5 - 5.1 mmol/L   Chloride 98 98 - 111 mmol/L   CO2 25 22 - 32 mmol/L   Glucose, Bld 142 (H) 70 - 99 mg/dL    Comment: Glucose reference range applies only to samples taken after fasting for at least 8 hours.   BUN 23 8 - 23 mg/dL   Creatinine, Ser 1.08 0.61 - 1.24 mg/dL   Calcium 9.3 8.9 - 10.3 mg/dL   GFR calc non Af Amer >60 >60 mL/min   GFR calc Af Amer >60 >60 mL/min   Anion gap 11 5 - 15    Comment: Performed at [facility], [address] [place], [place] [address]  Hepatic function panel     Status: Abnormal   Collection Time: 11/24/19  4:25 PM  Result Value Ref Range   Total Protein 7.1 6.5 - 8.1 g/dL   Albumin 4.4 3.5 - 5.0 g/dL   AST 27 15 - 41 U/L   ALT 23 0 - 44 U/L   Alkaline Phosphatase 82 38 - 126 U/L   Total Bilirubin 1.2 0.3 - 1.2 mg/dL   Bilirubin, Direct 0.2 0.0 - 0.2 mg/dL   Indirect Bilirubin 1.0 (H) 0.3 - 0.9 mg/dL    Comment: Performed at [facility], [address] [place], [place] [address]  Lipase, blood     Status: None   Collection Time: 11/24/19  4:25 PM  Result Value Ref Range   Lipase 34 11 - 51 U/L    Comment: Performed at [facility], [address] [place], [place] [address]  CBC with Differential     Status: None   Collection Time: 11/24/19  4:25 PM  Result Value Ref Range   WBC 8.9 4.0 - 10.5 K/uL   RBC 5.39 4.22 - 5.81 MIL/uL   Hemoglobin 15.7 13.0 - 17.0 g/dL   HCT 44.5 39 - 52 %   MCV 82.6 80.0 - 100.0 fL   MCH 29.1 26.0 - 34.0 pg   MCHC 35.3 30.0 - 36.0 g/dL   RDW 13.2 11.5 - 15.5 %   Platelets 258 150 - 400 K/uL   nRBC 0.0 0.0 - 0.2 %   Neutrophils Relative % 73 %   Neutro Abs 6.4 1.7 - 7.7 K/uL   Lymphocytes Relative 19 %   Lymphs Abs 1.7 0.7 - 4.0 K/uL    Monocytes Relative 6 %   Monocytes Absolute 0.6 0.1 - 1.0 K/uL   Eosinophils Relative 1 %   Eosinophils Absolute 0.1 0.0 - 0.5 K/uL   Basophils Relative 1 %   Basophils Absolute 0.1 0.0 - 0.1 K/uL   Immature Granulocytes 0 %   Abs Immature Granulocytes 0.02 0.00 - 0.07 K/uL    Comment: Performed at [facility], [place], [place] [address]  Troponin I (High Sensitivity)     Status: None   Collection Time: 11/24/19  4:25 PM  Result Value Ref Range   Troponin I (High Sensitivity) 9 <18 ng/L    Comment: (NOTE) Elevated high  sensitivity troponin I (hsTnI) values and significant  changes across serial measurements may suggest ACS but many other  chronic and acute conditions are known to elevate hsTnI results.  Refer to the "Links" section for chest pain algorithms and additional  guidance. Performed at [facility], [address] [place], [place] [address]   Lactic acid, plasma     Status: Abnormal   Collection Time: 11/24/19  4:44 PM  Result Value Ref Range   Lactic Acid, Venous 2.1 (HH) 0.5 - 1.9 mmol/L    Comment: CRITICAL RESULT CALLED TO, READ BACK BY AND VERIFIED WITH MAUREEN TUMEY @1735  ON 11/24/19 SKL Performed at [facility], [address] [place], [place] [address]   Urinalysis, Complete w Microscopic     Status: Abnormal   Collection Time: 11/24/19  4:44 PM  Result Value Ref Range   Color, Urine YELLOW (A) YELLOW   APPearance CLEAR (A) CLEAR   Specific Gravity, Urine >1.046 (H) 1.005 - 1.030   pH 5.0 5.0 - 8.0   Glucose, UA NEGATIVE NEGATIVE mg/dL   Hgb urine dipstick SMALL (A) NEGATIVE   Bilirubin Urine NEGATIVE NEGATIVE   Ketones, ur 20 (A) NEGATIVE mg/dL   Protein, ur NEGATIVE NEGATIVE mg/dL   Nitrite NEGATIVE NEGATIVE   Leukocytes,Ua NEGATIVE NEGATIVE   RBC / HPF 11-20 0 - 5 RBC/hpf   WBC, UA 0-5 0 - 5 WBC/hpf   Bacteria, UA NONE SEEN NONE SEEN   Squamous Epithelial / LPF 0-5 0 - 5   Mucus PRESENT     Comment:  Performed at [facility], [address] [place], [place] [address]  Lactic acid, plasma     Status: None   Collection Time: 11/24/19  7:27 PM  Result Value Ref Range   Lactic Acid, Venous 1.4 0.5 - 1.9 mmol/L    Comment: Performed at [facility], [place], [place] [address]  SARS CORONAVIRUS 2 (TAT 6-24 HRS) Nasopharyngeal Nasopharyngeal Swab     Status: None   Collection Time: 11/28/19  2:40 PM   Specimen: Nasopharyngeal Swab  Result Value Ref Range   SARS Coronavirus 2 NEGATIVE NEGATIVE    Comment: (NOTE) SARS-CoV-2 target nucleic acids are NOT DETECTED.  The SARS-CoV-2 RNA is generally detectable in upper and lower respiratory specimens during the acute phase of infection. Negative results do not preclude SARS-CoV-2 infection, do not rule out co-infections with other pathogens, and should not be used as the sole basis for treatment or other patient management decisions. Negative results must be combined with clinical observations, patient history, and epidemiological information. The expected result is Negative.  Fact Sheet for Patients: SugarRoll.be  Fact Sheet for Healthcare Providers: https://www.woods-mathews.com/  This test is not yet approved or cleared by the [place] FDA and  has been authorized for detection and/or diagnosis of SARS-CoV-2 by FDA under an Emergency Use Authorization (EUA). This EUA will remain  in effect (meaning this test can be used) for the duration of the COVID-19 declaration under Se ction 564(b)(1) of the Act, 21 U.S.C. section 360bbb-3(b)(1), unless the authorization is terminated or revoked sooner.  Performed at [facility], [address], [place], [place] [address]   Urinalysis, Complete     Status: Abnormal   Collection Time: 12/06/19  1:35 PM  Result Value Ref Range   Specific Gravity, UA 1.015 1.005 - 1.030   pH, UA 6.5 5.0 - 7.5   Color, UA  Yellow Yellow  Appearance Ur Hazy (A) Clear   Leukocytes,UA 1+ (A) Negative   Protein,UA 2+ (A) Negative/Trace   Glucose, UA Negative Negative   Ketones, UA Negative Negative   RBC, UA 2+ (A) Negative   Bilirubin, UA Negative Negative   Urobilinogen, Ur 0.2 0.2 - 1.0 mg/dL   Nitrite, UA Negative Negative   Microscopic Examination See below:   Microscopic Examination     Status: Abnormal   Collection Time: 12/06/19  1:35 PM   Urine  Result Value Ref Range   WBC, UA 11-30 (A) 0 - 5 /hpf   RBC 11-30 (A) 0 - 2 /hpf   Epithelial Cells (non renal) 0-10 0 - 10 /hpf   Renal Epithel, UA 0-10 (A) None seen /hpf   Casts Present (A) None seen /lpf   Cast Type Hyaline casts N/A   Bacteria, UA Few None seen/Few    Assessment/Plan:  Numbness and tingling of both legs below knees ABIs today were normal at 1.03 on the right and 1.19 on the left with brisk triphasic waveforms and normal digital pressures consistent with no arterial insufficiency.  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  No arterial insufficiency.  Can return to clinic as needed.  Hypercholesteremia lipid control important in reducing the progression of atherosclerotic disease. Continue statin therapy   Pallor of extremity ABIs today were normal at 1.03 on the right and 1.19 on the left with brisk triphasic waveforms and normal digital pressures consistent with no arterial insufficiency.  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      Leotis Pain 01/15/2020, 11:54 AM   This note was created with Dragon medical transcription system.  Any errors from dictation are unintentional.

## 2020-01-16 ENCOUNTER — Ambulatory Visit (INDEPENDENT_AMBULATORY_CARE_PROVIDER_SITE_OTHER): Payer: Medicare HMO | Admitting: Urology

## 2020-01-16 VITALS — BP 169/85 | HR 65 | Ht 69.0 in | Wt 194.0 lb

## 2020-01-16 DIAGNOSIS — N200 Calculus of kidney: Secondary | ICD-10-CM | POA: Diagnosis not present

## 2020-01-16 NOTE — Progress Notes (Signed)
   01/16/2020 2:03 PM   [name] Aug 25, 1945 [id number]  Reason for visit: Follow up nephrolithiasis  HPI: I saw Mr. With low back in urology clinic for follow-up of nephrolithiasis.  He is a 74 year old male with 3 prior stone events who underwent uncomplicated right ureteroscopy, laser lithotripsy, and stent placement on 11/30/2019 for an 8 mm proximal ureteral stone.  He denies any problems since his stent removal and is doing well.  He denies any gross hematuria or flank pain.  Follow-up renal ultrasound shows no residual hydronephrosis.  I personally reviewed the renal ultrasound.  He has a few ~62m non-obstructing stones on the left side, and we reviewed return precautions at length.  We discussed general stone prevention strategies including adequate hydration with goal of producing 2.5 L of urine daily, increasing citric acid intake, increasing calcium intake during high oxalate meals, minimizing animal protein, and decreasing salt intake. Information about dietary recommendations given today.   He would like to follow-up on an as-needed basis.  We again reviewed return precautions.   [place] [facility] [address] [place] [phone]

## 2020-01-16 NOTE — Patient Instructions (Signed)
Dietary Guidelines to Help Prevent Kidney Stones Kidney stones are deposits of minerals and salts that form inside your kidneys. Your risk of developing kidney stones may be greater depending on your diet, your lifestyle, the medicines you take, and whether you have certain medical conditions. Most people can reduce their chances of developing kidney stones by following the instructions below. Depending on your overall health and the type of kidney stones you tend to develop, your dietitian may give you more specific instructions. What are tips for following this plan? Reading food labels  Choose foods with "no salt added" or "low-salt" labels. Limit your sodium intake to less than 1500 mg per day.  Choose foods with calcium for each meal and snack. Try to eat about 300 mg of calcium at each meal. Foods that contain 200-500 mg of calcium per serving include: ? 8 oz (237 ml) of milk, fortified nondairy milk, and fortified fruit juice. ? 8 oz (237 ml) of kefir, yogurt, and soy yogurt. ? 4 oz (118 ml) of tofu. ? 1 oz of cheese. ? 1 cup (300 g) of dried figs. ? 1 cup (91 g) of cooked broccoli. ? 1-3 oz can of sardines or mackerel.  Most people need 1000 to 1500 mg of calcium each day. Talk to your dietitian about how much calcium is recommended for you. Shopping  Buy plenty of fresh fruits and vegetables. Most people do not need to avoid fruits and vegetables, even if they contain nutrients that may contribute to kidney stones.  When shopping for convenience foods, choose: ? Whole pieces of fruit. ? Premade salads with dressing on the side. ? Low-fat fruit and yogurt smoothies.  Avoid buying frozen meals or prepared deli foods.  Look for foods with live cultures, such as yogurt and kefir. Cooking  Do not add salt to food when cooking. Place a salt shaker on the table and allow each person to add his or her own salt to taste.  Use vegetable protein, such as beans, textured vegetable  protein (TVP), or tofu instead of meat in pasta, casseroles, and soups. Meal planning   Eat less salt, if told by your dietitian. To do this: ? Avoid eating processed or premade food. ? Avoid eating fast food.  Eat less animal protein, including cheese, meat, poultry, or fish, if told by your dietitian. To do this: ? Limit the number of times you have meat, poultry, fish, or cheese each week. Eat a diet free of meat at least 2 days a week. ? Eat only one serving each day of meat, poultry, fish, or seafood. ? When you prepare animal protein, cut pieces into small portion sizes. For most meat and fish, one serving is about the size of one deck of cards.  Eat at least 5 servings of fresh fruits and vegetables each day. To do this: ? Keep fruits and vegetables on hand for snacks. ? Eat 1 piece of fruit or a handful of berries with breakfast. ? Have a salad and fruit at lunch. ? Have two kinds of vegetables at dinner.  Limit foods that are high in a substance called oxalate. These include: ? Spinach. ? Rhubarb. ? Beets. ? Potato chips and french fries. ? Nuts.  If you regularly take a diuretic medicine, make sure to eat at least 1-2 fruits or vegetables high in potassium each day. These include: ? Avocado. ? Banana. ? Orange, prune, carrot, or tomato juice. ? Baked potato. ? Cabbage. ? Beans and split   peas. General instructions   Drink enough fluid to keep your urine clear or pale yellow. This is the most important thing you can do.  Talk to your health care provider and dietitian about taking daily supplements. Depending on your health and the cause of your kidney stones, you may be advised: ? Not to take supplements with vitamin C. ? To take a calcium supplement. ? To take a daily probiotic supplement. ? To take other supplements such as magnesium, fish oil, or vitamin B6.  Take all medicines and supplements as told by your health care provider.  Limit alcohol intake to no  more than 1 drink a day for nonpregnant women and 2 drinks a day for men. One drink equals 12 oz of beer, 5 oz of wine, or 1 oz of hard liquor.  Lose weight if told by your health care provider. Work with your dietitian to find strategies and an eating plan that works best for you. What foods are not recommended? Limit your intake of the following foods, or as told by your dietitian. Talk to your dietitian about specific foods you should avoid based on the type of kidney stones and your overall health. Grains Breads. Bagels. Rolls. Baked goods. Salted crackers. Cereal. Pasta. Vegetables Spinach. Rhubarb. Beets. Canned vegetables. Pickles. Olives. Meats and other protein foods Nuts. Nut butters. Large portions of meat, poultry, or fish. Salted or cured meats. Deli meats. Hot dogs. Sausages. Dairy Cheese. Beverages Regular soft drinks. Regular vegetable juice. Seasonings and other foods Seasoning blends with salt. Salad dressings. Canned soups. Soy sauce. Ketchup. Barbecue sauce. Canned pasta sauce. Casseroles. Pizza. Lasagna. Frozen meals. Potato chips. French fries. Summary  You can reduce your risk of kidney stones by making changes to your diet.  The most important thing you can do is drink enough fluid. You should drink enough fluid to keep your urine clear or pale yellow.  Ask your health care provider or dietitian how much protein from animal sources you should eat each day, and also how much salt and calcium you should have each day. This information is not intended to replace advice given to you by your health care provider. Make sure you discuss any questions you have with your health care provider. Document Revised: 07/05/2018 Document Reviewed: 02/24/2016 Elsevier Patient Education  2020 [employer]  

## 2020-01-21 DIAGNOSIS — R0789 Other chest pain: Secondary | ICD-10-CM | POA: Diagnosis not present

## 2020-01-28 DIAGNOSIS — R001 Bradycardia, unspecified: Secondary | ICD-10-CM | POA: Diagnosis not present

## 2020-01-28 DIAGNOSIS — R0789 Other chest pain: Secondary | ICD-10-CM | POA: Diagnosis not present

## 2020-01-28 DIAGNOSIS — I739 Peripheral vascular disease, unspecified: Secondary | ICD-10-CM | POA: Diagnosis not present

## 2020-01-28 DIAGNOSIS — I4589 Other specified conduction disorders: Secondary | ICD-10-CM | POA: Diagnosis not present

## 2020-01-28 DIAGNOSIS — I442 Atrioventricular block, complete: Secondary | ICD-10-CM | POA: Diagnosis not present

## 2020-01-28 DIAGNOSIS — E785 Hyperlipidemia, unspecified: Secondary | ICD-10-CM | POA: Diagnosis not present

## 2020-01-28 DIAGNOSIS — R413 Other amnesia: Secondary | ICD-10-CM | POA: Diagnosis not present

## 2020-01-30 ENCOUNTER — Inpatient Hospital Stay: Admission: RE | Admit: 2020-01-30 | Payer: Medicare HMO | Source: Ambulatory Visit

## 2020-01-30 NOTE — H&P (Signed)
Justin Warren is an 74 y.o. male.    Chief Complaint: Left hand pain and numbness  HPI: Patient has a history of numbness and tingling in the left hand.  The patient also has had weakness of pinch and grip.  He has had prior right carpal tunnel release which was successful.  He has had nerve conduction studies which show severe left carpal tunnel syndrome.  He is opted for surgery.  Risks and benefits were discussed with him at length.  Wishes to proceed.  Past Medical History:  Diagnosis Date  . Anxiety   . Bradycardia   . GERD (gastroesophageal reflux disease)   . History of kidney stones   . Hyperlipidemia   . Kidney stone     Past Surgical History:  Procedure Laterality Date  . APPENDECTOMY  1994  . BACK SURGERY    . CARPAL TUNNEL RELEASE Right 2012  . CATARACT EXTRACTION, BILATERAL  2019  . CYSTOSCOPY W/ RETROGRADES Right 11/30/2019   Procedure: CYSTOSCOPY WITH RETROGRADE PYELOGRAM;  Surgeon: [name], MD;  Location: [facility] ORS;  Service: Urology;  Laterality: Right;  . CYSTOSCOPY/URETEROSCOPY/HOLMIUM LASER/STENT PLACEMENT Right 11/30/2019   Procedure: CYSTOSCOPY/URETEROSCOPY/HOLMIUM LASER/STENT PLACEMENT;  Surgeon: [name], MD;  Location: [facility] ORS;  Service: Urology;  Laterality: Right;  . EYE SURGERY    . INSERT / REPLACE / REMOVE PACEMAKER    . JOINT REPLACEMENT    . LUMBAR LAMINECTOMIES  1976 & 1978   Dr. [name]  . PACEMAKER INSERTION Left 04/20/2018   Procedure: INSERTION PACEMAKER-DUAL CHAMBER;  Surgeon: [name], MD;  Location: [facility] ORS;  Service: Cardiovascular;  Laterality: Left;  . 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 SIGMOIDOSCOPY  2010    Family History  Problem Relation Age of Onset  . Dementia Mother   . GER disease Mother   . Hypertension Father   . Arrhythmia Sister   . Heart murmur Sister    Social History:  reports that he has quit smoking. His smoking use included cigarettes. He has a 60.00 pack-year smoking history. He  has never used smokeless tobacco. He reports previous alcohol use. He reports that he does not use drugs.  Allergies:  Allergies  Allergen Reactions  . Amoxicillin-Pot Clavulanate Nausea And Vomiting    DID THE REACTION INVOLVE: Swelling of the face/tongue/throat, SOB, or low BP? No Sudden or severe rash/hives, skin peeling, or the inside of the mouth or nose? No Did it require medical treatment? No When did it last happen?Over 10 years ago If all above answers are "NO", may proceed with cephalosporin use.    No medications prior to admission.    No results found for this or any previous visit (from the past 48 hour(s)). No results found.  Review of Systems  There were no vitals taken for this visit. Physical Exam  The patient has decreased sensation in the left hand.  He has decreased pinch and grip.  He has a positive median compression test.  Skin is intact.  There is a well-healed incision on the right carpal tunnel.  Assessment/Plan Left carpal tunnel syndrome-advanced. Left carpal tunnel release  Park Breed, MD 01/30/2020, 1:25 PM

## 2020-01-31 NOTE — Progress Notes (Addendum)
  [facility] Perioperative Services: Pre-Admission/Anesthesia Testing     Date: 01/31/20  Name: Justin Warren MRN:   [id number]  Re: Anesthesia plans for surgery   Case: [id number] Date/Time: 02/06/20 0730   Procedure: CARPAL TUNNEL RELEASE (Left )   Anesthesia type: General   Pre-op diagnosis: G56.02 Carpal tunnel syndrome, left upper limb   Location: [facility] OR ROOM 03 / [place] [facility]S FOR ANESTHESIA GROUP   Surgeons: Earnestine Leys, MD    Patient scheduled for the above procedure on 02/06/2020 with Dr. Earnestine Leys.  Call was placed to patient to complete preoperative interview, however he declined call citing that he wanted to speak to his doctor first.  Call was placed to Dr. Ammie Ferrier office by patient, and subsequent email received from physician's office, indicating that patient "did not want to be put to sleep".  Patient is requesting a change in anesthesia.  Surgery scheduler consult to Dr. Sabra Heck who advised, "he can request this from the anesthesia people.  It is fine with me."  Discussed patient's case with attending anesthesiologist on-call Ronelle Nigh, MD).  MD made aware that patient does not wish to be put to sleep for his upcoming elective orthopedic procedure.  Discussed alternatives for this case. MD advising that Bier's block or local + sedation can be used for this procedure. Will touch base with performing surgeons office to make them aware so that options can be discussed and patient can decide whether or not he is willing to proceed. Stressed to surgery coordinator that patient must understand that additional anesthesia may be required if procedure cannot be safely and effectively completed with the aforementioned methods. Will ask that surgery coordinator to notify PAT office once patient has decided so we can move forward with getting him scheduled for pre-operative interview if he is going to proceed with surgery.   Honor Loh, MSN, APRN, FNP-C, CEN [facility]  Peri-operative Services Nurse Practitioner Phone: [phone] 01/31/20 4:04 PM

## 2020-02-01 ENCOUNTER — Other Ambulatory Visit: Payer: Self-pay

## 2020-02-01 ENCOUNTER — Encounter
Admission: RE | Admit: 2020-02-01 | Discharge: 2020-02-01 | Disposition: A | Discharge status: Home / Self Care | Payer: Medicare HMO | Source: Ambulatory Visit | Attending: Specialist | Admitting: Specialist

## 2020-02-01 HISTORY — DX: Polyneuropathy, unspecified: G62.9

## 2020-02-01 HISTORY — DX: Essential (primary) hypertension: I10

## 2020-02-01 NOTE — Patient Instructions (Addendum)
Your procedure is scheduled on: Wednesday, November 10 Report to the Registration Desk on the 1st floor of the [facility]. To find out your arrival time, please call [phone] between 1PM - 3PM on: Tuesday, November 9  REMEMBER: Instructions that are not followed completely may result in serious medical risk, up to and including death; or upon the discretion of your surgeon and anesthesiologist your surgery may need to be rescheduled.  Do not eat food after midnight the night before surgery.  No gum chewing, lozengers or hard candies.  You may however, drink CLEAR liquids up to 2 hours before you are scheduled to arrive for your surgery. Do not drink anything within 2 hours of your scheduled arrival time.  Clear liquids include: - water  - apple juice without pulp - gatorade (not RED, PURPLE, OR BLUE) - black coffee or tea (Do NOT add milk or creamers to the coffee or tea) Do NOT drink anything that is not on this list.  TAKE THESE MEDICATIONS THE MORNING OF SURGERY WITH A SIP OF WATER:  1.  Buspirone 2.  Famotidine 3.  Fluoxetine  One week prior to surgery: Stop Anti-inflammatories (NSAIDS) such as Advil, Aleve, Ibuprofen, Motrin, Naproxen, Naprosyn and Aspirin based products such as Excedrin, Goodys Powder, BC Powder. Stop ANY OVER THE COUNTER supplements until after surgery.  No Alcohol for 24 hours before or after surgery.  On the morning of surgery brush your teeth with toothpaste and water, you may rinse your mouth with mouthwash if you wish. Do not swallow any toothpaste or mouthwash.  Do not wear jewelry.  Do not wear lotions, powders, or perfumes.   Do not shave 48 hours prior to surgery.   Do not bring valuables to the hospital. [facility] is not responsible for any missing/lost belongings or valuables.   Use CHG Soap as directed on instruction sheet.  Notify your doctor if there is any change in your medical condition (cold, fever, infection).  If you  are being discharged the day of surgery, you will not be allowed to drive home. You will need a responsible adult (18 years or older) to drive you home and stay with you that night.   Please call the [facility] at [phone] if you have any questions about these instructions.  Visitation Policy:  Patients undergoing a surgery or procedure may have one family member or support person with them as long as that person is not COVID-19 positive or experiencing its symptoms.  That person may remain in the waiting area during the procedure.

## 2020-02-04 ENCOUNTER — Other Ambulatory Visit
Admission: RE | Admit: 2020-02-04 | Discharge: 2020-02-04 | Disposition: A | Discharge status: Home / Self Care | Payer: Medicare HMO | Source: Ambulatory Visit | Attending: Specialist | Admitting: Specialist

## 2020-02-04 ENCOUNTER — Other Ambulatory Visit: Payer: Self-pay

## 2020-02-04 DIAGNOSIS — Z20822 Contact with and (suspected) exposure to covid-19: Secondary | ICD-10-CM | POA: Insufficient documentation

## 2020-02-04 DIAGNOSIS — Z01812 Encounter for preprocedural laboratory examination: Secondary | ICD-10-CM | POA: Diagnosis not present

## 2020-02-04 LAB — SARS CORONAVIRUS 2 (TAT 6-24 HRS): SARS Coronavirus 2: NEGATIVE

## 2020-02-05 ENCOUNTER — Other Ambulatory Visit: Payer: Medicare HMO

## 2020-02-06 ENCOUNTER — Other Ambulatory Visit: Payer: Self-pay

## 2020-02-06 ENCOUNTER — Encounter
Admission: RE | Disposition: A | Discharge status: Home / Self Care | Payer: Self-pay | Source: Home / Self Care | Attending: Specialist

## 2020-02-06 ENCOUNTER — Encounter: Payer: Self-pay | Admitting: Specialist

## 2020-02-06 ENCOUNTER — Ambulatory Visit: Payer: Medicare HMO | Admitting: Urgent Care

## 2020-02-06 ENCOUNTER — Ambulatory Visit
Admission: RE | Admit: 2020-02-06 | Discharge: 2020-02-06 | Disposition: A | Discharge status: Home / Self Care | Payer: Medicare HMO | Attending: Specialist | Admitting: Specialist

## 2020-02-06 DIAGNOSIS — Z880 Allergy status to penicillin: Secondary | ICD-10-CM | POA: Insufficient documentation

## 2020-02-06 DIAGNOSIS — Z87891 Personal history of nicotine dependence: Secondary | ICD-10-CM | POA: Diagnosis not present

## 2020-02-06 DIAGNOSIS — M65332 Trigger finger, left middle finger: Secondary | ICD-10-CM | POA: Insufficient documentation

## 2020-02-06 DIAGNOSIS — I10 Essential (primary) hypertension: Secondary | ICD-10-CM | POA: Diagnosis not present

## 2020-02-06 DIAGNOSIS — G5602 Carpal tunnel syndrome, left upper limb: Secondary | ICD-10-CM | POA: Insufficient documentation

## 2020-02-06 DIAGNOSIS — F418 Other specified anxiety disorders: Secondary | ICD-10-CM | POA: Diagnosis not present

## 2020-02-06 DIAGNOSIS — E7800 Pure hypercholesterolemia, unspecified: Secondary | ICD-10-CM | POA: Diagnosis not present

## 2020-02-06 HISTORY — PX: CARPAL TUNNEL RELEASE: SHX101

## 2020-02-06 HISTORY — PX: TRIGGER FINGER RELEASE: SHX641

## 2020-02-06 SURGERY — CARPAL TUNNEL RELEASE
Anesthesia: Regional | Laterality: Left

## 2020-02-06 MED ORDER — SEVOFLURANE IN SOLN
RESPIRATORY_TRACT | Status: AC
  Filled 2020-02-06: qty 250

## 2020-02-06 MED ORDER — METHYLENE BLUE 0.5 % INJ SOLN
INTRAVENOUS | Status: AC
  Filled 2020-02-06: qty 10

## 2020-02-06 MED ORDER — CEFAZOLIN SODIUM-DEXTROSE 2-4 GM/100ML-% IV SOLN
2.0000 g | INTRAVENOUS | Status: AC
  Administered 2020-02-06: 2 g via INTRAVENOUS

## 2020-02-06 MED ORDER — LIDOCAINE HCL (PF) 0.5 % IJ SOLN
INTRAMUSCULAR | Status: DC | PRN
  Administered 2020-02-06: 50 mL via INTRAVENOUS

## 2020-02-06 MED ORDER — CHLORHEXIDINE GLUCONATE 0.12 % MT SOLN: 15.0000 mL | Freq: Once | OROMUCOSAL | Status: AC

## 2020-02-06 MED ORDER — CLINDAMYCIN PHOSPHATE 600 MG/50ML IV SOLN
600.0000 mg | INTRAVENOUS | Status: AC
  Administered 2020-02-06: 600 mg via INTRAVENOUS

## 2020-02-06 MED ORDER — HYDROCODONE-ACETAMINOPHEN 5-325 MG PO TABS
1.0000 | ORAL_TABLET | Freq: Four times a day (QID) | ORAL | 0 refills | Status: DC | PRN
Start: 2020-02-06 — End: 2020-05-08

## 2020-02-06 MED ORDER — CLINDAMYCIN PHOSPHATE 600 MG/50ML IV SOLN
INTRAVENOUS | Status: AC
  Filled 2020-02-06: qty 50

## 2020-02-06 MED ORDER — DROPERIDOL 2.5 MG/ML IJ SOLN
0.6250 mg | Freq: Once | INTRAMUSCULAR | Status: DC | PRN
  Filled 2020-02-06: qty 2

## 2020-02-06 MED ORDER — SODIUM CHLORIDE FLUSH 0.9 % IV SOLN
INTRAVENOUS | Status: AC
  Filled 2020-02-06: qty 10

## 2020-02-06 MED ORDER — FENTANYL CITRATE (PF) 100 MCG/2ML IJ SOLN
INTRAMUSCULAR | Status: DC | PRN
  Administered 2020-02-06 (×2): 50 ug via INTRAVENOUS

## 2020-02-06 MED ORDER — OXYCODONE HCL 5 MG/5ML PO SOLN: 5.0000 mg | Freq: Once | ORAL | Status: DC | PRN

## 2020-02-06 MED ORDER — LIDOCAINE HCL (PF) 2 % IJ SOLN
INTRAMUSCULAR | Status: AC
  Filled 2020-02-06: qty 5

## 2020-02-06 MED ORDER — LORAZEPAM 2 MG/ML IJ SOLN: 1.0000 mg | Freq: Once | INTRAMUSCULAR | Status: DC | PRN

## 2020-02-06 MED ORDER — ORAL CARE MOUTH RINSE: 15.0000 mL | Freq: Once | OROMUCOSAL | Status: AC

## 2020-02-06 MED ORDER — GABAPENTIN 400 MG PO CAPS: 400.0000 mg | ORAL_CAPSULE | Freq: Three times a day (TID) | ORAL | 3 refills | Status: DC

## 2020-02-06 MED ORDER — LACTATED RINGERS IV SOLN: INTRAVENOUS | Status: DC

## 2020-02-06 MED ORDER — MELOXICAM 7.5 MG PO TABS: 15.0000 mg | ORAL_TABLET | ORAL | Status: AC

## 2020-02-06 MED ORDER — PROMETHAZINE HCL 25 MG/ML IJ SOLN: 6.2500 mg | INTRAMUSCULAR | Status: DC | PRN

## 2020-02-06 MED ORDER — LIDOCAINE HCL (PF) 0.5 % IJ SOLN
INTRAMUSCULAR | Status: AC
  Filled 2020-02-06: qty 50

## 2020-02-06 MED ORDER — GABAPENTIN 300 MG PO CAPS
ORAL_CAPSULE | ORAL | Status: AC
  Administered 2020-02-06: 300 mg via ORAL
  Filled 2020-02-06: qty 1

## 2020-02-06 MED ORDER — DEXAMETHASONE SODIUM PHOSPHATE 10 MG/ML IJ SOLN
INTRAMUSCULAR | Status: AC
  Filled 2020-02-06: qty 1

## 2020-02-06 MED ORDER — CHLORHEXIDINE GLUCONATE CLOTH 2 % EX PADS
6.0000 | MEDICATED_PAD | Freq: Once | CUTANEOUS | Status: AC
  Administered 2020-02-06: 6 via TOPICAL

## 2020-02-06 MED ORDER — BUPIVACAINE-EPINEPHRINE (PF) 0.25% -1:200000 IJ SOLN
INTRAMUSCULAR | Status: AC
  Filled 2020-02-06: qty 30

## 2020-02-06 MED ORDER — MELOXICAM 7.5 MG PO TABS
ORAL_TABLET | ORAL | Status: AC
  Administered 2020-02-06: 15 mg via ORAL
  Filled 2020-02-06: qty 2

## 2020-02-06 MED ORDER — BUPIVACAINE HCL (PF) 0.5 % IJ SOLN
INTRAMUSCULAR | Status: AC
  Filled 2020-02-06: qty 30

## 2020-02-06 MED ORDER — PROPOFOL 10 MG/ML IV BOLUS
INTRAVENOUS | Status: AC
  Filled 2020-02-06: qty 40

## 2020-02-06 MED ORDER — OXYCODONE HCL 5 MG PO TABS: 5.0000 mg | ORAL_TABLET | Freq: Once | ORAL | Status: DC | PRN

## 2020-02-06 MED ORDER — BUPIVACAINE-EPINEPHRINE (PF) 0.5% -1:200000 IJ SOLN
INTRAMUSCULAR | Status: AC
  Filled 2020-02-06: qty 30

## 2020-02-06 MED ORDER — CEFAZOLIN SODIUM-DEXTROSE 2-4 GM/100ML-% IV SOLN
INTRAVENOUS | Status: AC
  Filled 2020-02-06: qty 100

## 2020-02-06 MED ORDER — FENTANYL CITRATE (PF) 100 MCG/2ML IJ SOLN
INTRAMUSCULAR | Status: AC
  Filled 2020-02-06: qty 2

## 2020-02-06 MED ORDER — BUPIVACAINE HCL (PF) 0.5 % IJ SOLN
INTRAMUSCULAR | Status: DC | PRN
  Administered 2020-02-06: 18 mL

## 2020-02-06 MED ORDER — DEXAMETHASONE SODIUM PHOSPHATE 4 MG/ML IJ SOLN
INTRAMUSCULAR | Status: DC | PRN
  Administered 2020-02-06: 10 mg via INTRAVENOUS

## 2020-02-06 MED ORDER — GABAPENTIN 300 MG PO CAPS: 300.0000 mg | ORAL_CAPSULE | ORAL | Status: AC

## 2020-02-06 MED ORDER — CHLORHEXIDINE GLUCONATE 0.12 % MT SOLN
OROMUCOSAL | Status: AC
  Administered 2020-02-06: 15 mL via OROMUCOSAL
  Filled 2020-02-06: qty 15

## 2020-02-06 MED ORDER — PROPOFOL 500 MG/50ML IV EMUL
INTRAVENOUS | Status: DC | PRN
  Administered 2020-02-06: 55 ug/kg/min via INTRAVENOUS

## 2020-02-06 MED ORDER — HYDROMORPHONE HCL 1 MG/ML IJ SOLN: 0.2500 mg | INTRAMUSCULAR | Status: DC | PRN

## 2020-02-06 MED ORDER — MEPERIDINE HCL 50 MG/ML IJ SOLN: 6.2500 mg | INTRAMUSCULAR | Status: DC | PRN

## 2020-02-06 SURGICAL SUPPLY — 36 items
APL PRP STRL LF DISP 70% ISPRP (MISCELLANEOUS) ×1
BLADE SURG MINI STRL (BLADE) ×2 IMPLANT
BNDG ESMARK 4X12 TAN STRL LF (GAUZE/BANDAGES/DRESSINGS) ×3 IMPLANT
CANISTER SUCT 1200ML W/VALVE (MISCELLANEOUS) ×2 IMPLANT
CHLORAPREP W/TINT 26 (MISCELLANEOUS) ×2 IMPLANT
CORD BIP STRL DISP 12FT (MISCELLANEOUS) ×1 IMPLANT
COVER WAND RF STERILE (DRAPES) ×2 IMPLANT
CUFF TOURN SGL QUICK 18X4 (TOURNIQUET CUFF) IMPLANT
DRSG GAUZE FLUFF 36X18 (GAUZE/BANDAGES/DRESSINGS) ×4 IMPLANT
ELECT REM PT RETURN 9FT ADLT (ELECTROSURGICAL) ×2
ELECTRODE REM PT RTRN 9FT ADLT (ELECTROSURGICAL) ×1 IMPLANT
FORCEPS JEWEL BIP 4-3/4 STR (INSTRUMENTS) ×1 IMPLANT
GAUZE XEROFORM 1X8 LF (GAUZE/BANDAGES/DRESSINGS) ×2 IMPLANT
GLOVE BIO SURGEON STRL SZ8 (GLOVE) ×2 IMPLANT
GOWN STRL REUS W/ TWL LRG LVL3 (GOWN DISPOSABLE) ×1 IMPLANT
GOWN STRL REUS W/TWL LRG LVL3 (GOWN DISPOSABLE) ×2
GOWN STRL REUS W/TWL LRG LVL4 (GOWN DISPOSABLE) ×2 IMPLANT
KIT TURNOVER KIT A (KITS) ×2 IMPLANT
MANIFOLD NEPTUNE II (INSTRUMENTS) ×2 IMPLANT
NS IRRIG 500ML POUR BTL (IV SOLUTION) ×2 IMPLANT
PACK EXTREMITY (MISCELLANEOUS) ×2 IMPLANT
PAD PREP 24X41 OB/GYN DISP (PERSONAL CARE ITEMS) ×2 IMPLANT
PADDING CAST 4IN STRL (MISCELLANEOUS) ×1
PADDING CAST BLEND 4X4 STRL (MISCELLANEOUS) ×1 IMPLANT
SPLINT CAST 1 STEP 3X12 (MISCELLANEOUS) ×2 IMPLANT
STOCKINETTE 48X4 2 PLY STRL (GAUZE/BANDAGES/DRESSINGS) ×1 IMPLANT
STOCKINETTE BIAS CUT 4 980044 (GAUZE/BANDAGES/DRESSINGS) ×2 IMPLANT
STOCKINETTE STRL 4IN 9604848 (GAUZE/BANDAGES/DRESSINGS) ×2 IMPLANT
STOCKINETTE STRL 6IN 960660 (GAUZE/BANDAGES/DRESSINGS) ×2 IMPLANT
SUT ETHILON 4-0 (SUTURE) ×2
SUT ETHILON 4-0 FS2 18XMFL BLK (SUTURE) ×1
SUT ETHILON 5-0 (SUTURE) ×2
SUT ETHILON 5-0 C-3 18XMFL BLK (SUTURE) ×1
SUT ETHILON 5-0 FS-2 18 BLK (SUTURE) ×2 IMPLANT
SUTURE ETHLN 4-0 FS2 18XMF BLK (SUTURE) ×1 IMPLANT
SUTURE ETHLN 5-0 C3 18XMF BLK (SUTURE) ×1 IMPLANT

## 2020-02-06 NOTE — Anesthesia Procedure Notes (Signed)
Anesthesia Procedure Note

## 2020-02-06 NOTE — Op Note (Signed)
02/06/2020  8:50 AM  PATIENT:  Justin Warren    PRE-OPERATIVE DIAGNOSIS: LEFT CARPAL TUNNEL SYNDROME                                                      TRIGGERING LEFT MIDDLE FINGER  POST-OPERATIVE DIAGNOSIS: LEFT CARPAL TUNNEL SYNDROME                       LEFT MIDDLE FINGER TRIGGERING  PROCEDURE:  LEFT CARPAL TUNNEL RELEASE                            RELEASE LEFT MIDDLE FINGER  SURGEON: Park Breed, MD  TOURNIQUET TIME: 67  MIN   ANESTHESIA:   IV REGIONAL  PREOPERATIVE INDICATIONS:  Justin Warren is a  74 y.o. male with a diagnosis of left carpal tunnel syndrome and triggering of the left middle finger who failed conservative measures and elected for surgical management.    The risks benefits and alternatives were discussed with the patient preoperatively including but not limited to the risks of infection, bleeding, nerve injury, incomplete relief of symptoms, pillar pain, cardiopulmonary complications, the need for revision surgery, among others, and the patient was willing to proceed.  OPERATIVE FINDINGS: Thickened volar ligament and nerve compression. Thickened A-1 pulley left middle finger  OPERATIVE PROCEDURE: The patient is brought to the operating room placed in the supine position. IV regional anesthesia was administered. The left upper extremity was prepped and draped in usual sterile fashion. Time out was performed. Using loupe magification, a transverse incision was made over the A-1 pulley of the left middle finger and dissection carried out in the midline down to the pulley. Retractors were inserted and the pulley released. The sheath was released proximally and distally as well. There was no further catching. After irrigation, the wound was closed with 4-0 nylon. An incision was made in line with the radial border of the ring finger. The carpal tunnel transverse fascia was identified, cleaned, and incised sharply. The common sensory branches were visualized  along with the superficial palmar arch and protected.  The median nerve was protected below. A Kelly clamp was  placed underneath the transverse carpal ligament, protecting the nerve. I released the ligament completely, and then released the proximal distal volar forearm fascia. The nerve was identified, and visualized, and protected throughout the case. The motor branch was intact upon inspection. No masses or abnormalities were identified in the ulnar bursa.  The wounds were irrigated copiously and the skin closed with nylon. The wound was injected with 1/2 % marcaine followed by a sterile dressing and volar splint. Tourniquet was deflated with good return of blood flow to all fingers. Sponge and needle counts were correct.  The patient tolerated this well, with no complications. The patient was awakened and taken to recovery in good condition.

## 2020-02-06 NOTE — Anesthesia Postprocedure Evaluation (Signed)
Anesthesia Post Note  Patient: Justin Warren  Procedure(s) Performed: CARPAL TUNNEL RELEASE (Left ) RELEASE TRIGGER FINGER/A-1 PULLEY (Left )  Patient location during evaluation: PACU Anesthesia Type: Bier Block and General Level of consciousness: awake and awake and alert Pain management: pain level controlled Vital Signs Assessment: post-procedure vital signs reviewed and stable Respiratory status: spontaneous breathing and nonlabored ventilation Cardiovascular status: blood pressure returned to baseline and stable Postop Assessment: no headache and no apparent nausea or vomiting Anesthetic complications: no   No complications documented.   Last Vitals:  Vitals:   02/06/20 0606 02/06/20 0855  BP: (!) 144/75   Pulse: (!) 59   Resp: 18   Temp: 36.5 C (!) (P) 36.1 C  SpO2: 96%     Last Pain:  Vitals:   02/06/20 0606  TempSrc: Temporal  PainSc: 2                  Neva Seat

## 2020-02-06 NOTE — Transfer of Care (Signed)
Immediate Anesthesia Transfer of Care Note  Patient: Justin Warren  Procedure(s) Performed: CARPAL TUNNEL RELEASE (Left ) RELEASE TRIGGER FINGER/A-1 PULLEY (Left )  Patient Location: PACU  Anesthesia Type:Regional and MAC combined with regional for post-op pain  Level of Consciousness: sedated  Airway & Oxygen Therapy: Patient Spontanous Breathing and Patient connected to nasal cannula oxygen  Post-op Assessment: Report given to RN and Post -op Vital signs reviewed and stable  Post vital signs: Reviewed and stable  Last Vitals:  Vitals Value Taken Time  BP 162/90 02/06/20 0857  Temp    Pulse 58 02/06/20 0858  Resp 9 02/06/20 0858  SpO2 100 % 02/06/20 0858  Vitals shown include unvalidated device data.  Last Pain:  Vitals:   02/06/20 0855  TempSrc:   PainSc: (P) 0-No pain         Complications: No complications documented.

## 2020-02-06 NOTE — Discharge Instructions (Signed)

## 2020-02-06 NOTE — H&P (Signed)
THE PATIENT WAS SEEN PRIOR TO SURGERY TODAY.  HISTORY, ALLERGIES, HOME MEDICATIONS AND OPERATIVE PROCEDURE WERE REVIEWED. RISKS AND BENEFITS OF SURGERY DISCUSSED WITH PATIENT AGAIN.  NO CHANGES FROM INITIAL HISTORY AND PHYSICAL NOTED.    

## 2020-02-06 NOTE — Anesthesia Preprocedure Evaluation (Signed)
Anesthesia Evaluation  Patient identified by MRN, date of birth, ID band Patient awake    Reviewed: Allergy & Precautions, NPO status , Patient's Chart, lab work & pertinent test results  Airway Mallampati: II       Dental  (+) Poor Dentition,    Pulmonary neg pulmonary ROS, former smoker,    Pulmonary exam normal breath sounds clear to auscultation       Cardiovascular hypertension, + Peripheral Vascular Disease  negative cardio ROS Normal cardiovascular exam+ dysrhythmias + pacemaker  Rhythm:Regular Rate:Normal     Neuro/Psych PSYCHIATRIC DISORDERS Anxiety Depression  Neuromuscular disease    GI/Hepatic Neg liver ROS, GERD  ,  Endo/Other  negative endocrine ROS  Renal/GU negative Renal ROS  negative genitourinary   Musculoskeletal  (+) Arthritis ,   Abdominal   Peds negative pediatric ROS (+)  Hematology negative hematology ROS (+)   Anesthesia Other Findings   Reproductive/Obstetrics negative OB ROS                             Anesthesia Physical Anesthesia Plan  ASA: III  Anesthesia Plan: Bier Block and General and Bier Block-LIDOCAINE ONLY   Post-op Pain Management:    Induction: Intravenous  PONV Risk Score and Plan: 2 and Propofol infusion  Airway Management Planned: Nasal Cannula  Additional Equipment:   Intra-op Plan:   Post-operative Plan:   Informed Consent: I have reviewed the patients History and Physical, chart, labs and discussed the procedure including the risks, benefits and alternatives for the proposed anesthesia with the patient or authorized representative who has indicated his/her understanding and acceptance.       Plan Discussed with: CRNA, Anesthesiologist and Surgeon  Anesthesia Plan Comments:         Anesthesia Quick Evaluation

## 2020-02-25 ENCOUNTER — Telehealth (INDEPENDENT_AMBULATORY_CARE_PROVIDER_SITE_OTHER): Payer: Medicare HMO | Admitting: Family Medicine

## 2020-02-25 ENCOUNTER — Encounter: Payer: Self-pay | Admitting: Family Medicine

## 2020-02-25 ENCOUNTER — Other Ambulatory Visit: Payer: Self-pay

## 2020-02-25 DIAGNOSIS — R195 Other fecal abnormalities: Secondary | ICD-10-CM | POA: Diagnosis not present

## 2020-02-25 DIAGNOSIS — R143 Flatulence: Secondary | ICD-10-CM

## 2020-02-25 NOTE — Progress Notes (Signed)
Virtual telephone visit    Virtual Visit via Telephone Note   This visit type was conducted due to national recommendations for restrictions regarding the COVID-19 Pandemic (e.g. social distancing) in an effort to limit this patient's exposure and mitigate transmission in our community. Due to his co-morbid illnesses, this patient is at least at moderate risk for complications without adequate follow up. This format is felt to be most appropriate for this patient at this time. The patient did not have access to video technology or had technical difficulties with video requiring transitioning to audio format only (telephone). Physical exam was limited to content and character of the telephone converstion.    Patient location: home Provider location: office  I discussed the limitations of evaluation and management by telemedicine and the availability of in person appointments. The patient expressed understanding and agreed to proceed.   Visit Date: 02/25/2020  Today's healthcare provider: Vernie Murders, PA   No chief complaint on file.  Subjective    HPI  Patient is a 74 year old male who presents via phone visit for evaluation of abdominal/GI issues.  He states that he has been having increased amounts of gas and daily episodes of soft watery bowel movements.  He states this has been going on for about 2 months.  He does not have any pain, fever or blood in the stool.  He does not relate it to any specific foods but does state he recently started on Aricept. Past Medical History:  Diagnosis Date  . Anxiety   . Bradycardia   . GERD (gastroesophageal reflux disease)   . History of kidney stones   . Hyperlipidemia   . Hypertension   . Kidney stone   . Neuropathy    Past Surgical History:  Procedure Laterality Date  . APPENDECTOMY  1994  . BACK SURGERY    . CARPAL TUNNEL RELEASE Right 2012  . CARPAL TUNNEL RELEASE Left 02/06/2020   Procedure: CARPAL TUNNEL RELEASE;   Surgeon: Earnestine Leys, MD;  Location: [facility];  Service: Orthopedics;  Laterality: Left;  BLOCK  . CATARACT EXTRACTION, BILATERAL  2019  . CYSTOSCOPY W/ RETROGRADES Right 11/30/2019   Procedure: CYSTOSCOPY WITH RETROGRADE PYELOGRAM;  Surgeon: [name], MD;  Location: [facility];  Service: Urology;  Laterality: Right;  . CYSTOSCOPY/URETEROSCOPY/HOLMIUM LASER/STENT PLACEMENT Right 11/30/2019   Procedure: CYSTOSCOPY/URETEROSCOPY/HOLMIUM LASER/STENT PLACEMENT;  Surgeon: [name], MD;  Location: [facility];  Service: Urology;  Laterality: Right;  . EYE SURGERY    . INSERT / REPLACE / REMOVE PACEMAKER    . JOINT REPLACEMENT    . LUMBAR LAMINECTOMIES  1976 & 1978   Dr. [name]  . PACEMAKER INSERTION Left 04/20/2018   Procedure: INSERTION PACEMAKER-DUAL CHAMBER;  Surgeon: [name], MD;  Location: [facility];  Service: Cardiovascular;  Laterality: Left;  . 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 SIGMOIDOSCOPY  2010  . TRIGGER FINGER RELEASE Left 02/06/2020   Procedure: RELEASE TRIGGER FINGER/A-1 PULLEY;  Surgeon: Earnestine Leys, MD;  Location: [facility];  Service: Orthopedics;  Laterality: Left;   Family History  Problem Relation Age of Onset  . Dementia Mother   . GER disease Mother   . Hypertension Father   . Arrhythmia Sister   . Heart murmur Sister    Social History   Tobacco Use  . Smoking status: Former Smoker    Packs/day: 2.00    Years: 30.00    Pack years: 60.00    Types: Cigarettes  .  Smokeless tobacco: Never Used  . Tobacco comment: quit in 1991-1992  Vaping Use  . Vaping Use: Never used  Substance Use Topics  . Alcohol use: Not Currently    Alcohol/week: 0.0 standard drinks    Comment: quit over 30 years ago  . Drug use: No   Allergies  Allergen Reactions  . Amoxicillin-Pot Clavulanate Nausea And Vomiting    DID THE REACTION INVOLVE: Swelling of the face/tongue/throat, SOB, or low BP? No Sudden or severe rash/hives, skin peeling, or the inside of  the mouth or nose? No Did it require medical treatment? No When did it last happen?Over 10 years ago If all above answers are "NO", may proceed with cephalosporin use.   Medications: Outpatient Medications Prior to Visit  Medication Sig  . acetaminophen (TYLENOL) 650 MG CR tablet Take 650 mg by mouth every 8 (eight) hours as needed for pain.  . busPIRone (BUSPAR) 15 MG tablet Take 1 tablet (15 mg total) by mouth 2 (two) times daily.  Marland Kitchen donepezil (ARICEPT) 5 MG tablet Take 5 mg by mouth at bedtime.   . famotidine (PEPCID) 20 MG tablet Take 1 tablet (20 mg total) by mouth 2 (two) times daily.  Marland Kitchen FLUoxetine (PROZAC) 20 MG capsule Take 1 capsule (20 mg total) by mouth daily.  Marland Kitchen gabapentin (NEURONTIN) 400 MG capsule Take 1 capsule (400 mg total) by mouth 3 (three) times daily.  Marland Kitchen HYDROcodone-acetaminophen (NORCO) 5-325 MG tablet Take 1-2 tablets by mouth every 6 (six) hours as needed.  . meloxicam (MOBIC) 15 MG tablet Take 15 mg by mouth daily.   . simvastatin (ZOCOR) 20 MG tablet Take 1 tablet (20 mg total) by mouth daily.   No facility-administered medications prior to visit.    Review of Systems  Gastrointestinal: Positive for diarrhea. Negative for abdominal pain, anal bleeding, blood in stool, constipation, nausea and vomiting.      Objective    There were no vitals taken for this visit.  During telephonic interview, no apparent acute distress.   Assessment & Plan     1. Flatulence symptom Onset over the past 6-8 weeks having unusual stools with some gas. No blood in stools or nausea. Recommend Beano for better digestion and anti-gas treatment.   2. Loose stools No diarrhea or blood in stools. No abdominal pains or vomiting. Has been started on Metoprolol by Dr. Ubaldo Glassing (cardiologist) and Aricept by Dr. [name] (neurologist) for dementia symptoms. Recommend add Probiotic supplement for the next 1-2 weeks and call report of response. May need further evaluation or referral to  gastroenterologist.   No follow-ups on file.    I discussed the assessment and treatment plan with the patient. The patient was provided an opportunity to ask questions and all were answered. The patient agreed with the plan and demonstrated an understanding of the instructions.   The patient was advised to call back or seek an in-person evaluation if the symptoms worsen or if the condition fails to improve as anticipated.  I provided 15 minutes of non-face-to-face time during this encounter.  Andres Shad, PA, have reviewed all documentation for this visit. The documentation on 02/25/20 for the exam, diagnosis, procedures, and orders are all accurate and complete.   Vernie Murders, [place] [phone] (phone) [phone] (fax)  [place]

## 2020-02-29 ENCOUNTER — Ambulatory Visit (INDEPENDENT_AMBULATORY_CARE_PROVIDER_SITE_OTHER): Payer: Medicare HMO | Admitting: Family Medicine

## 2020-02-29 ENCOUNTER — Encounter: Payer: Self-pay | Admitting: Family Medicine

## 2020-02-29 DIAGNOSIS — J45909 Unspecified asthma, uncomplicated: Secondary | ICD-10-CM

## 2020-02-29 MED ORDER — ALBUTEROL SULFATE HFA 108 (90 BASE) MCG/ACT IN AERS: 2.0000 | INHALATION_SPRAY | Freq: Four times a day (QID) | RESPIRATORY_TRACT | 2 refills | Status: DC | PRN

## 2020-02-29 MED ORDER — AZITHROMYCIN 250 MG PO TABS: ORAL_TABLET | ORAL | 0 refills | Status: DC

## 2020-02-29 NOTE — Progress Notes (Signed)
Virtual telephone visit    Virtual Visit via Telephone Note   This visit type was conducted due to national recommendations for restrictions regarding the COVID-19 Pandemic (e.g. social distancing) in an effort to limit this patient's exposure and mitigate transmission in our community. Due to his co-morbid illnesses, this patient is at least at moderate risk for complications without adequate follow up. This format is felt to be most appropriate for this patient at this time. The patient did not have access to video technology or had technical difficulties with video requiring transitioning to audio format only (telephone). Physical exam was limited to content and character of the telephone converstion.    Patient location: Home Provider location: Office  I discussed the limitations of evaluation and management by telemedicine and the availability of in person appointments. The patient expressed understanding and agreed to proceed.   Visit Date: 02/29/2020  Today's healthcare provider: Vernie Murders, PA   No chief complaint on file.  Subjective    HPI  This 74 year old male developed a cough with greenish sputum, head congestion, PND and some wheezing after working in his yard clearing leaves over the past weekend. Denies having any fever, sore throat, earache, GI upset, chills or body aches. Has not tried any OTC meds.    Past Medical History:  Diagnosis Date  . Anxiety   . Bradycardia   . GERD (gastroesophageal reflux disease)   . History of kidney stones   . Hyperlipidemia   . Hypertension   . Kidney stone   . Neuropathy    Past Surgical History:  Procedure Laterality Date  . APPENDECTOMY  1994  . BACK SURGERY    . CARPAL TUNNEL RELEASE Right 2012  . CARPAL TUNNEL RELEASE Left 02/06/2020   Procedure: CARPAL TUNNEL RELEASE;  Surgeon: Earnestine Leys, MD;  Location: [facility];  Service: Orthopedics;  Laterality: Left;  BLOCK  . CATARACT EXTRACTION, BILATERAL  2019    . CYSTOSCOPY W/ RETROGRADES Right 11/30/2019   Procedure: CYSTOSCOPY WITH RETROGRADE PYELOGRAM;  Surgeon: [name], MD;  Location: [facility];  Service: Urology;  Laterality: Right;  . CYSTOSCOPY/URETEROSCOPY/HOLMIUM LASER/STENT PLACEMENT Right 11/30/2019   Procedure: CYSTOSCOPY/URETEROSCOPY/HOLMIUM LASER/STENT PLACEMENT;  Surgeon: [name], MD;  Location: [facility];  Service: Urology;  Laterality: Right;  . EYE SURGERY    . INSERT / REPLACE / REMOVE PACEMAKER    . JOINT REPLACEMENT    . LUMBAR LAMINECTOMIES  1976 & 1978   Dr. [name]  . PACEMAKER INSERTION Left 04/20/2018   Procedure: INSERTION PACEMAKER-DUAL CHAMBER;  Surgeon: [name], MD;  Location: [facility];  Service: Cardiovascular;  Laterality: Left;  . 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 SIGMOIDOSCOPY  2010  . TRIGGER FINGER RELEASE Left 02/06/2020   Procedure: RELEASE TRIGGER FINGER/A-1 PULLEY;  Surgeon: Earnestine Leys, MD;  Location: [facility];  Service: Orthopedics;  Laterality: Left;   Social History   Tobacco Use  . Smoking status: Former Smoker    Packs/day: 2.00    Years: 30.00    Pack years: 60.00    Types: Cigarettes  . Smokeless tobacco: Never Used  . Tobacco comment: quit in 1991-1992  Vaping Use  . Vaping Use: Never used  Substance Use Topics  . Alcohol use: Not Currently    Alcohol/week: 0.0 standard drinks    Comment: quit over 30 years ago  . Drug use: No   Family History  Problem Relation Age of Onset  . Dementia Mother   .  GER disease Mother   . Hypertension Father   . Arrhythmia Sister   . Heart murmur Sister    Allergies  Allergen Reactions  . Amoxicillin-Pot Clavulanate Nausea And Vomiting    DID THE REACTION INVOLVE: Swelling of the face/tongue/throat, SOB, or low BP? No Sudden or severe rash/hives, skin peeling, or the inside of the mouth or nose? No Did it require medical treatment? No When did it last happen?Over 10 years ago If all above answers are "NO", may  proceed with cephalosporin use.      Medications: Outpatient Medications Prior to Visit  Medication Sig  . acetaminophen (TYLENOL) 650 MG CR tablet Take 650 mg by mouth every 8 (eight) hours as needed for pain.  . busPIRone (BUSPAR) 15 MG tablet Take 1 tablet (15 mg total) by mouth 2 (two) times daily.  Marland Kitchen donepezil (ARICEPT) 5 MG tablet Take 5 mg by mouth at bedtime.   . famotidine (PEPCID) 20 MG tablet Take 1 tablet (20 mg total) by mouth 2 (two) times daily.  Marland Kitchen FLUoxetine (PROZAC) 20 MG capsule Take 1 capsule (20 mg total) by mouth daily.  Marland Kitchen gabapentin (NEURONTIN) 400 MG capsule Take 1 capsule (400 mg total) by mouth 3 (three) times daily.  Marland Kitchen HYDROcodone-acetaminophen (NORCO) 5-325 MG tablet Take 1-2 tablets by mouth every 6 (six) hours as needed.  . meloxicam (MOBIC) 15 MG tablet Take 15 mg by mouth daily.   . simvastatin (ZOCOR) 20 MG tablet Take 1 tablet (20 mg total) by mouth daily.   No facility-administered medications prior to visit.    Review of Systems    Objective    There were no vitals taken for this visit.  During telephonic interview, some coughing heard but no acute dyspnea.   Assessment & Plan     1. Bronchitis, allergic, unspecified asthma severity, uncomplicated Onset over the past 5 days after working in his yard clearing leaves. No fever, sore throat or loss of taste/smell. No exposure to COVID known. Has had COVID vaccinations and booster. Monitor for fever or COVID symptoms. Maintain COVID restrictions. Treat with antibiotic, bronchodilator and Mucinex-DM. Recheck prn. - azithromycin (ZITHROMAX) 250 MG tablet; Take 2 tablets by mouth today, then 1 daily for 4 days.  Dispense: 6 tablet; Refill: 0 - albuterol (VENTOLIN HFA) 108 (90 Base) MCG/ACT inhaler; Inhale 2 puffs into the lungs every 6 (six) hours as needed for wheezing or shortness of breath.  Dispense: 8 g; Refill: 2   No follow-ups on file.    I discussed the assessment and treatment plan with  the patient. The patient was provided an opportunity to ask questions and all were answered. The patient agreed with the plan and demonstrated an understanding of the instructions.   The patient was advised to call back or seek an in-person evaluation if the symptoms worsen or if the condition fails to improve as anticipated.  I provided 20 minutes of non-face-to-face time during this encounter.  Andres Shad, PA, have reviewed all documentation for this visit. The documentation on 02/29/20 for the exam, diagnosis, procedures, and orders are all accurate and complete.    Vernie Murders, [place] [phone] (phone) [phone] (fax)  [place]

## 2020-03-07 ENCOUNTER — Telehealth: Payer: Self-pay | Admitting: Family Medicine

## 2020-03-07 NOTE — Telephone Encounter (Signed)
[place] faxed refill request for the following medications:  simvastatin (ZOCOR) 20 MG tablet famotidine (PEPCID) 20 MG tablet   Please advise. Thanks, [employer]

## 2020-03-10 MED ORDER — SIMVASTATIN 20 MG PO TABS
20.0000 mg | ORAL_TABLET | Freq: Every day | ORAL | 3 refills | Status: DC
Start: 2020-03-10 — End: 2020-11-10

## 2020-03-10 MED ORDER — FAMOTIDINE 20 MG PO TABS
20.0000 mg | ORAL_TABLET | Freq: Two times a day (BID) | ORAL | 2 refills | Status: DC
Start: 2020-03-10 — End: 2020-07-02

## 2020-03-12 ENCOUNTER — Telehealth: Payer: Self-pay

## 2020-03-12 NOTE — Telephone Encounter (Signed)
Copied from [place] [id number]. Topic: General - Other >> Mar 12, 2020  8:28 AM Celene Kras wrote: Pt called and is requesting to speak with PCP or nurse regarding chronic care center for the numbness and pain in his feet. He states that they do not accept insurance and is requesting to know if this would be a good investment for him. Please advise.

## 2020-03-24 ENCOUNTER — Ambulatory Visit: Payer: Self-pay

## 2020-03-24 ENCOUNTER — Other Ambulatory Visit: Payer: Self-pay | Admitting: Family Medicine

## 2020-03-24 ENCOUNTER — Telehealth: Payer: Self-pay | Admitting: Family Medicine

## 2020-03-24 DIAGNOSIS — J45909 Unspecified asthma, uncomplicated: Secondary | ICD-10-CM

## 2020-03-24 MED ORDER — DOXYCYCLINE HYCLATE 100 MG PO TABS: 100.0000 mg | ORAL_TABLET | Freq: Two times a day (BID) | ORAL | 0 refills | Status: DC

## 2020-03-24 NOTE — Telephone Encounter (Signed)
Please advise 

## 2020-03-24 NOTE — Telephone Encounter (Signed)
Pt called requesting a refill of the antibiotic that he was just recently prescribed. He has a cough, and congestion in his chest/head.   Please advise if this is possible, warm transferring to triage since he mentioned wheezing  [facility]

## 2020-03-24 NOTE — Telephone Encounter (Signed)
Recommend Doxycycline 100 mg BID #20 (sent to [facility]) and use the Albuterol inhaler as needed with Mucinex-DM. Schedule recheck appointment if no better in 5-6 days.

## 2020-03-24 NOTE — Telephone Encounter (Signed)
Changed antibiotic to the [place].

## 2020-03-24 NOTE — Telephone Encounter (Signed)
Advised 

## 2020-03-24 NOTE — Telephone Encounter (Signed)
Pt. Reports he was treated for bronchitis earlier this month and got better. This weekend started coughing again with yellow mucus, also sinus discharge with yellow mucus. Has some wheezing with his cough. No fever. Asking if he needs to be on more antibiotics "to clear this up." Please advise pt.  Answer Assessment - Initial Assessment Questions 1. ONSET: "When did the cough begin?"      Beginning of December 2. SEVERITY: "How bad is the cough today?"       Moderate 3. SPUTUM: "Describe the color of your sputum" (none, dry cough; clear, white, yellow, green)     Yellow 4. HEMOPTYSIS: "Are you coughing up any blood?" If so ask: "How much?" (flecks, streaks, tablespoons, etc.)     No 5. DIFFICULTY BREATHING: "Are you having difficulty breathing?" If Yes, ask: "How bad is it?" (e.g., mild, moderate, severe)    - MILD: No SOB at rest, mild SOB with walking, speaks normally in sentences, can lay down, no retractions, pulse < 100.    - MODERATE: SOB at rest, SOB with minimal exertion and prefers to sit, cannot lie down flat, speaks in phrases, mild retractions, audible wheezing, pulse 100-120.    - SEVERE: Very SOB at rest, speaks in single words, struggling to breathe, sitting hunched forward, retractions, pulse > 120      Mild 6. FEVER: "Do you have a fever?" If Yes, ask: "What is your temperature, how was it measured, and when did it start?"     No 7. CARDIAC HISTORY: "Do you have any history of heart disease?" (e.g., heart attack, congestive heart failure)      Pacemaker 8. LUNG HISTORY: "Do you have any history of lung disease?"  (e.g., pulmonary embolus, asthma, emphysema)     No 9. PE RISK FACTORS: "Do you have a history of blood clots?" (or: recent major surgery, recent prolonged travel, bedridden)     No 10. OTHER SYMPTOMS: "Do you have any other symptoms?" (e.g., runny nose, wheezing, chest pain)       Wheezing 11. PREGNANCY: "Is there any chance you are pregnant?" "When was your last  menstrual period?"       n/a 12. TRAVEL: "Have you traveled out of the country in the last month?" (e.g., travel history, exposures)       No  Protocols used: [place]

## 2020-04-03 ENCOUNTER — Telehealth: Payer: Self-pay

## 2020-04-03 NOTE — Telephone Encounter (Signed)
Copied from [place] [phone] [phone]. Topic: General - Other >> Apr 03, 2020  3:05 PM Alanda Slim E wrote: Reason for CRM: Pt asked if provider can mail him a cologuard kit / Pt stated hes been having some irregularities with his bowels / He stated he had talked to [facility] about this before and was suppose to come by and get one but he would like it mailed / please advise

## 2020-04-03 NOTE — Telephone Encounter (Signed)
What type of irregularities? If blood in stools, Cologuard will only confirm need for colonoscopy.

## 2020-04-10 ENCOUNTER — Other Ambulatory Visit: Payer: Self-pay

## 2020-04-10 ENCOUNTER — Ambulatory Visit (INDEPENDENT_AMBULATORY_CARE_PROVIDER_SITE_OTHER): Payer: Medicare HMO | Admitting: Family Medicine

## 2020-04-10 ENCOUNTER — Encounter: Payer: Self-pay | Admitting: Family Medicine

## 2020-04-10 VITALS — BP 171/81 | HR 59 | Temp 97.6°F | Resp 16 | Wt 198.2 lb

## 2020-04-10 DIAGNOSIS — M21371 Foot drop, right foot: Secondary | ICD-10-CM | POA: Diagnosis not present

## 2020-04-10 DIAGNOSIS — R195 Other fecal abnormalities: Secondary | ICD-10-CM | POA: Diagnosis not present

## 2020-04-10 DIAGNOSIS — M21372 Foot drop, left foot: Secondary | ICD-10-CM

## 2020-04-10 DIAGNOSIS — E7800 Pure hypercholesterolemia, unspecified: Secondary | ICD-10-CM | POA: Diagnosis not present

## 2020-04-10 NOTE — Patient Instructions (Signed)
Indigestion Indigestion is a feeling of pain, discomfort, burning, or fullness in the upper part of your abdomen. It can come and go. It may occur frequently or rarely. Indigestion tends to occur while you are eating or right after you have finished eating. It may be worse at night and while bending over or lying down. Indigestion may be a symptom of an underlying digestive condition. Follow these instructions at home: Eating and drinking  Follow an eating plan as recommended by your health care provider.  Avoid certain foods and drinks as told by your health care provider. This may include: ? Chocolate and cocoa. ? Peppermint and mint flavorings. ? Garlic and onions. ? Horseradish. ? Spicy and acidic foods, including peppers, chili powder, curry powder, vinegar, hot sauces, and barbecue sauce. ? Citrus fruits, such as oranges, lemons, and limes. ? Tomato-based foods, such as red sauce, chili, salsa, and pizza with red sauce. ? Fried and fatty foods, such as donuts, french fries, potato chips, and high-fat dressings. ? High-fat meats, such as hot dogs and fatty cuts of red and white meats, such as rib eye steak, sausage, ham, and bacon. ? High-fat dairy items, such as whole milk, butter, and cream cheese. ? Coffee and tea, with or without caffeine. ? Drinks that contain alcohol. ? Energy drinks and sports drinks. ? Carbonated drinks or sodas. ? Citrus fruit juices.  Eat small, frequent meals instead of large meals.  Avoid drinking large amounts of liquid with your meals.  Avoid eating meals during the 2-3 hours before bedtime.  Avoid lying down right after you eat.  Avoid exercise for 2 hours after you eat.   Lifestyle  Maintain a healthy weight. Ask your health care provider what weight is healthy for you. If you need to lose weight, work with your health care provider to do so safely.  Exercise for at least 30 minutes on 5 or more days each week, or as told by your health care  provider. Avoid exercises that include bending forward. This can make your symptoms worse.  Wear loose-fitting clothing. Do not wear anything tight around your waist that causes pressure on your abdomen.  Do not use any products that contain nicotine or tobacco. These products include cigarettes, chewing tobacco, and vaping devices, such as e-cigarettes. These can make symptoms worse. If you need help quitting, ask your health care provider.  Raise (elevate) the head of your bed about 6 inches (15 cm) when you sleep. You can use a wedge to do this.  Try to reduce your stress, such as with yoga or meditation. If you need help reducing stress, ask your health care provider.      General instructions  Take over-the-counter and prescription medicines only as told by your health care provider.  Do not take aspirin or NSAIDs, such as ibuprofen, unless your health care provider told you to take them.  Pay attention to any changes in your symptoms.  Keep all follow-up visits. This is important. Contact a health care provider if:  You have new symptoms.  You have unexplained weight loss.  You have difficulty swallowing, or it hurts to swallow.  Your symptoms do not improve with treatment.  Your symptoms last for more than 2 days.  You have a fever.  You vomit. Get help right away if:  You suddenly have pain in your arms, neck, jaw, teeth, or back.  You suddenly feel sweaty, dizzy, or light-headed.  You faint.  You have chest pain  or shortness of breath.  You cannot stop vomiting, or you vomit blood.  Your stool is bloody or black.  You have severe pain in your abdomen. These symptoms may represent a serious problem that is an emergency. Do not wait to see if the symptoms will go away. Get medical help right away. Call your local emergency services (911 in the [place] Do not drive yourself to the hospital. Summary  Indigestion is a feeling of pain, discomfort, burning, or  fullness in the upper part of your abdomen. It tends to occur while you are eating or right after you have finished eating.  Follow an eating plan and other lifestyle changes as told by your health care provider.  Take over-the-counter and prescription medicines only as told by your health care provider. Do not take aspirin or NSAIDs, such as ibuprofen, unless your health care provider told you to do so.  Contact your health care provider if your symptoms do not get better or they get worse. Some symptoms may represent a serious problem that is an emergency. Do not wait to see if the symptoms will go away. Get medical help right away. This information is not intended to replace advice given to you by your health care provider. Make sure you discuss any questions you have with your health care provider. Document Revised: 09/19/2019 Document Reviewed: 09/19/2019 Elsevier Patient Education  [place].

## 2020-04-10 NOTE — Progress Notes (Signed)
Established patient visit   Patient: Justin Warren   DOB: 08-05-1945   75 y.o. Male  MRN: [id number] Visit Date: 04/10/2020  Today's healthcare provider: Vernie Murders, PA-C   Chief Complaint  Patient presents with  . Abdominal Pain   Subjective    HPI  Abdominal Pain  He reports new onset abdominal pain. The most recent episode started a few weeks ago and is gradually worsening. The abdominal pain is located in the upper abdomen and does not radiate. It is described as cramping, is moderate in intensity, occurring intermittently. It is aggravated by eating and is relieved by having a bowel movement. He has tried nothing to try to relieve pain with no relief.  Associated symptoms: No anorexia  No belching  No bloody stool No blood in urine   No constipation Yes diarrhea  No dysuria No fever  Yes flatus No headaches  No headaches No joint pains  No myalgias No nausea  No vomiting No weight loss     Recent GI studies:colonoscopy in 2014 benign hyperplastic polyp in the transverse colon removed by cold snare.   Previous labs Lab Results  Component Value Date   WBC 8.9 11/24/2019   HGB 15.7 11/24/2019   HCT 44.5 11/24/2019   MCV 82.6 11/24/2019   MCH 29.1 11/24/2019   RDW 13.2 11/24/2019   PLT 258 11/24/2019   Lab Results  Component Value Date   GLUCOSE 142 (H) 11/24/2019   NA 134 (L) 11/24/2019   K 3.2 (L) 11/24/2019   CL 98 11/24/2019   CO2 25 11/24/2019   BUN 23 11/24/2019   CREATININE 1.08 11/24/2019   GFRNONAA >60 11/24/2019   GFRAA >60 11/24/2019   CALCIUM 9.3 11/24/2019   PROT 7.1 11/24/2019   ALBUMIN 4.4 11/24/2019   LABGLOB 2.0 05/23/2019   AGRATIO 2.2 05/23/2019   BILITOT 1.2 11/24/2019   ALKPHOS 82 11/24/2019   AST 27 11/24/2019   ALT 23 11/24/2019   ANIONGAP 11 11/24/2019   No results found for: AMYLASE ----------------------------------------------------------------------------------------- Past Medical History:  Diagnosis Date   . Anxiety   . Bradycardia   . GERD (gastroesophageal reflux disease)   . History of kidney stones   . Hyperlipidemia   . Hypertension   . Kidney stone   . Neuropathy    Past Surgical History:  Procedure Laterality Date  . APPENDECTOMY  1994  . BACK SURGERY    . CARPAL TUNNEL RELEASE Right 2012  . CARPAL TUNNEL RELEASE Left 02/06/2020   Procedure: CARPAL TUNNEL RELEASE;  Surgeon: Earnestine Leys, MD;  Location: [facility] ORS;  Service: Orthopedics;  Laterality: Left;  BLOCK  . CATARACT EXTRACTION, BILATERAL  2019  . CYSTOSCOPY W/ RETROGRADES Right 11/30/2019   Procedure: CYSTOSCOPY WITH RETROGRADE PYELOGRAM;  Surgeon: [name], MD;  Location: [facility];  Service: Urology;  Laterality: Right;  . CYSTOSCOPY/URETEROSCOPY/HOLMIUM LASER/STENT PLACEMENT Right 11/30/2019   Procedure: CYSTOSCOPY/URETEROSCOPY/HOLMIUM LASER/STENT PLACEMENT;  Surgeon: [name], MD;  Location: [facility] OR[facility];  Service: Urology;  Laterality: Right;  . EYE SURGERY    . INSERT / REPLACE / REMOVE PACEMAKER    . JOINT REPLACEMENT    . LUMBAR LAMINECTOMIES  1976 & 1978   Dr. [name]  . PACEMAKER INSERTION Left 04/20/2018   Procedure: INSERTION PACEMAKER-DUAL CHAMBER;  Surgeon: [name], MD;  Location: [facility] OR[facility];  Service: Cardiovascular;  Laterality: Left;  . 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 SIGMOIDOSCOPY  2010  . TRIGGER FINGER  RELEASE Left 02/06/2020   Procedure: RELEASE TRIGGER FINGER/A-1 PULLEY;  Surgeon: Earnestine Leys, MD;  Location: [facility];  Service: Orthopedics;  Laterality: Left;   Social History   Tobacco Use  . Smoking status: Former Smoker    Packs/day: 2.00    Years: 30.00    Pack years: 60.00    Types: Cigarettes  . Smokeless tobacco: Never Used  . Tobacco comment: quit in 1991-1992  Vaping Use  . Vaping Use: Never used  Substance Use Topics  . Alcohol use: Not Currently    Alcohol/week: 0.0 standard drinks    Comment: quit over 30 years ago  . Drug use: No   Family  History  Problem Relation Age of Onset  . Dementia Mother   . GER disease Mother   . Hypertension Father   . Arrhythmia Sister   . Heart murmur Sister    Allergies  Allergen Reactions  . Amoxicillin-Pot Clavulanate Nausea And Vomiting    DID THE REACTION INVOLVE: Swelling of the face/tongue/throat, SOB, or low BP? No Sudden or severe rash/hives, skin peeling, or the inside of the mouth or nose? No Did it require medical treatment? No When did it last happen?Over 10 years ago If all above answers are "NO", may proceed with cephalosporin use.       Medications: Outpatient Medications Prior to Visit  Medication Sig  . acetaminophen (TYLENOL) 650 MG CR tablet Take 650 mg by mouth every 8 (eight) hours as needed for pain.  Marland Kitchen albuterol (VENTOLIN HFA) 108 (90 Base) MCG/ACT inhaler Inhale 2 puffs into the lungs every 6 (six) hours as needed for wheezing or shortness of breath.  . busPIRone (BUSPAR) 15 MG tablet Take 1 tablet (15 mg total) by mouth 2 (two) times daily.  Marland Kitchen donepezil (ARICEPT) 5 MG tablet Take 5 mg by mouth at bedtime.   Marland Kitchen doxycycline (VIBRA-TABS) 100 MG tablet Take 1 tablet (100 mg total) by mouth 2 (two) times daily.  . famotidine (PEPCID) 20 MG tablet Take 1 tablet (20 mg total) by mouth 2 (two) times daily.  Marland Kitchen FLUoxetine (PROZAC) 20 MG capsule Take 1 capsule (20 mg total) by mouth daily.  Marland Kitchen gabapentin (NEURONTIN) 400 MG capsule Take 1 capsule (400 mg total) by mouth 3 (three) times daily.  Marland Kitchen HYDROcodone-acetaminophen (NORCO) 5-325 MG tablet Take 1-2 tablets by mouth every 6 (six) hours as needed.  . meloxicam (MOBIC) 15 MG tablet Take 15 mg by mouth daily.   . simvastatin (ZOCOR) 20 MG tablet Take 1 tablet (20 mg total) by mouth daily.   No facility-administered medications prior to visit.    Review of Systems  Constitutional: Negative.   HENT: Negative.   Respiratory: Negative.   Cardiovascular: Negative.   Gastrointestinal:       Poor digestion with gas.       Objective    BP (!) 171/81   Pulse (!) 59   Temp 97.6 F (36.4 C) (Oral)   Resp 16   Wt 198 lb 3.2 oz (89.9 kg)   SpO2 97%   BMI 29.27 kg/m    Physical Exam Constitutional:      General: He is not in acute distress.    Appearance: He is well-developed and well-nourished.  HENT:     Head: Normocephalic and atraumatic.     Right Ear: Hearing normal.     Left Ear: Hearing normal.     Nose: Nose normal.  Eyes:     General: Lids are normal. No  scleral icterus.       Right eye: No discharge.        Left eye: No discharge.     Conjunctiva/sclera: Conjunctivae normal.  Cardiovascular:     Rate and Rhythm: Normal rate and regular rhythm.     Heart sounds: Normal heart sounds.  Pulmonary:     Effort: Pulmonary effort is normal. No respiratory distress.     Breath sounds: Normal breath sounds.  Abdominal:     General: Abdomen is flat. Bowel sounds are normal.     Palpations: Abdomen is soft.     Tenderness: There is no abdominal tenderness.  Musculoskeletal:        General: Normal range of motion.  Skin:    General: Skin is intact.     Findings: No lesion or rash.  Neurological:     Mental Status: He is alert and oriented to person, place, and time.  Psychiatric:        Mood and Affect: Mood and affect normal.        Speech: Speech normal.        Behavior: Behavior normal.        Thought Content: Thought content normal.       No results found for any visits on 04/10/20.  Assessment & Plan     1. Loose stools Intermittent loose stools and gas problems. No significant heart burn, hematemesis, hematochezia, constipation, dysuria, nausea or vomiting. May try Beano (digestive enzymes and anti-gas) before each meal. Check routine labs to assess for anemia, infection or liver dysfunction. - CBC with Differential/Platelet - Comprehensive metabolic panel  2. Hypercholesteremia Still on simvastatin 20 mg qd. Maintain low fat diet and recheck labs. - Comprehensive  metabolic panel - Lipid panel - TSH  3. Foot drop, bilateral Unchanged and maintain follow up with neurologist and vascular surgeon.   No follow-ups on file.      I, Anitra Doxtater, PA-C, have reviewed all documentation for this visit. The documentation on 04/10/20 for the exam, diagnosis, procedures, and orders are all accurate and complete.    Vernie Murders, PA-C  [employer] [phone] (phone) [phone] (fax)  [place]

## 2020-04-16 DIAGNOSIS — R195 Other fecal abnormalities: Secondary | ICD-10-CM | POA: Diagnosis not present

## 2020-04-16 DIAGNOSIS — E7800 Pure hypercholesterolemia, unspecified: Secondary | ICD-10-CM | POA: Diagnosis not present

## 2020-04-17 LAB — CBC WITH DIFFERENTIAL/PLATELET
Basophils Absolute: 0.1 10*3/uL (ref 0.0–0.2)
Basos: 1 %
EOS (ABSOLUTE): 0.2 10*3/uL (ref 0.0–0.4)
Eos: 3 %
Hematocrit: 47.1 % (ref 37.5–51.0)
Hemoglobin: 16.1 g/dL (ref 13.0–17.7)
Immature Grans (Abs): 0 10*3/uL (ref 0.0–0.1)
Immature Granulocytes: 0 %
Lymphocytes Absolute: 1.6 10*3/uL (ref 0.7–3.1)
Lymphs: 29 %
MCH: 28.1 pg (ref 26.6–33.0)
MCHC: 34.2 g/dL (ref 31.5–35.7)
MCV: 82 fL (ref 79–97)
Monocytes Absolute: 0.5 10*3/uL (ref 0.1–0.9)
Monocytes: 9 %
Neutrophils Absolute: 3.2 10*3/uL (ref 1.4–7.0)
Neutrophils: 58 %
Platelets: 232 10*3/uL (ref 150–450)
RBC: 5.72 x10E6/uL (ref 4.14–5.80)
RDW: 13.2 % (ref 11.6–15.4)
WBC: 5.5 10*3/uL (ref 3.4–10.8)

## 2020-04-17 LAB — COMPREHENSIVE METABOLIC PANEL
ALT: 25 IU/L (ref 0–44)
AST: 24 IU/L (ref 0–40)
Albumin/Globulin Ratio: 2 (ref 1.2–2.2)
Albumin: 4.4 g/dL (ref 3.7–4.7)
Alkaline Phosphatase: 120 IU/L (ref 44–121)
BUN/Creatinine Ratio: 13 (ref 10–24)
BUN: 13 mg/dL (ref 8–27)
Bilirubin Total: 0.7 mg/dL (ref 0.0–1.2)
CO2: 24 mmol/L (ref 20–29)
Calcium: 10 mg/dL (ref 8.6–10.2)
Chloride: 104 mmol/L (ref 96–106)
Creatinine, Ser: 1.01 mg/dL (ref 0.76–1.27)
GFR calc Af Amer: 83 mL/min/{1.73_m2} (ref >59)
GFR calc non Af Amer: 71 mL/min/{1.73_m2} (ref >59)
Globulin, Total: 2.2 g/dL (ref 1.5–4.5)
Glucose: 107 mg/dL — ABNORMAL HIGH (ref 65–99)
Potassium: 4.4 mmol/L (ref 3.5–5.2)
Sodium: 142 mmol/L (ref 134–144)
Total Protein: 6.6 g/dL (ref 6.0–8.5)

## 2020-04-17 LAB — LIPID PANEL
Chol/HDL Ratio: 4.4 ratio (ref 0.0–5.0)
Cholesterol, Total: 171 mg/dL (ref 100–199)
HDL: 39 mg/dL — ABNORMAL LOW (ref >39)
LDL Chol Calc (NIH): 104 mg/dL — ABNORMAL HIGH (ref 0–99)
Triglycerides: 159 mg/dL — ABNORMAL HIGH (ref 0–149)
VLDL Cholesterol Cal: 28 mg/dL (ref 5–40)

## 2020-04-17 LAB — TSH: TSH: 2 u[IU]/mL (ref 0.450–4.500)

## 2020-05-06 ENCOUNTER — Telehealth: Payer: Self-pay

## 2020-05-06 ENCOUNTER — Ambulatory Visit: Payer: Medicare HMO | Admitting: Adult Health

## 2020-05-06 NOTE — Telephone Encounter (Signed)
PT called in requesting a referral to a gastroenterologist due to continued stomach issues, PT asked if the referral could be placed to [place]. Please advise.

## 2020-05-06 NOTE — Telephone Encounter (Signed)
Patient has been advised that referral can not be made until he is evaluated by PA or MD. Appointment has been  Scheduled with [facility] Thursday for evaluation and treatment. KW

## 2020-05-06 NOTE — Telephone Encounter (Signed)
Patient came into office for 3 pm appointment with Sharyn Lull. Patient wants a call back advising him what he needs to do regarding his stomach issues or if he can be referred since it's reoccurring.  Please call patient back to advise.  Thanks, [employer]

## 2020-05-08 ENCOUNTER — Other Ambulatory Visit: Payer: Self-pay

## 2020-05-08 ENCOUNTER — Ambulatory Visit (INDEPENDENT_AMBULATORY_CARE_PROVIDER_SITE_OTHER): Payer: Medicare HMO | Admitting: Physician Assistant

## 2020-05-08 ENCOUNTER — Encounter: Payer: Self-pay | Admitting: Physician Assistant

## 2020-05-08 VITALS — BP 128/80 | HR 63 | Temp 98.2°F | Resp 16 | Wt 195.4 lb

## 2020-05-08 DIAGNOSIS — M25512 Pain in left shoulder: Secondary | ICD-10-CM | POA: Diagnosis not present

## 2020-05-08 DIAGNOSIS — Z1211 Encounter for screening for malignant neoplasm of colon: Secondary | ICD-10-CM | POA: Diagnosis not present

## 2020-05-08 DIAGNOSIS — R110 Nausea: Secondary | ICD-10-CM | POA: Diagnosis not present

## 2020-05-08 DIAGNOSIS — R197 Diarrhea, unspecified: Secondary | ICD-10-CM | POA: Diagnosis not present

## 2020-05-08 DIAGNOSIS — M25511 Pain in right shoulder: Secondary | ICD-10-CM

## 2020-05-08 DIAGNOSIS — R12 Heartburn: Secondary | ICD-10-CM

## 2020-05-08 DIAGNOSIS — R140 Abdominal distension (gaseous): Secondary | ICD-10-CM

## 2020-05-08 MED ORDER — OMEPRAZOLE 40 MG PO CPDR: 40.0000 mg | DELAYED_RELEASE_CAPSULE | Freq: Every day | ORAL | 1 refills | Status: DC

## 2020-05-08 MED ORDER — MELOXICAM 15 MG PO TABS: 15.0000 mg | ORAL_TABLET | Freq: Every day | ORAL | 1 refills | Status: DC

## 2020-05-08 NOTE — Progress Notes (Signed)
Established patient visit   Patient: Justin Warren   DOB: 1945/06/03   75 y.o. Male  MRN: [id number] Visit Date: 05/08/2020  Today's healthcare provider: Mar Daring, PA-C   Chief Complaint  Patient presents with  . Abdominal Pain   Subjective    Abdominal Pain This is a recurrent (Started in December.) problem. The onset quality is sudden. The problem occurs constantly (loose stools daily, especially after eating mayonnaisen). The problem has been gradually improving (in the last two days). The patient is experiencing no pain. The quality of the pain is dull. The abdominal pain does not radiate. Associated symptoms include belching, diarrhea, flatus and nausea. Pertinent negatives include no dysuria, fever, hematuria, melena, vomiting or weight loss. Exacerbated by: certain foods. He has tried antacids for the symptoms. The treatment provided moderate relief. His past medical history is significant for GERD.     Patient Active Problem List   Diagnosis Date Noted  . Pallor of extremity 01/15/2020  . Numbness and tingling of both legs below knees 06/10/2019  . Loss of memory 06/10/2019  . Difficulty walking 06/10/2019  . Ataxia 06/10/2019  . PAD (peripheral artery disease) ([place]) 05/25/2019  . History of total knee arthroplasty 12/07/2018  . CHB (complete heart block) ([place]) 04/20/2018  . Atrioventricular (AV) dissociation 04/06/2018  . Bradycardia 03/09/2018  . Atypical chest pain 03/09/2018  . Foot drop, bilateral 05/02/2017  . 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 Hx of decompressive lumbar laminectomy 04/09/2016  . Anxiety 03/07/2015  . Clinical depression 03/07/2015  . Difficulty hearing 03/07/2015  . Hypercholesteremia 03/07/2015  . 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 Acid reflux 06/26/2009  . External hemorrhoids without complication 33/35/4562  . 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 Smoking status: Former Smoker    Packs/day: 2.00    Years: 30.00    Pack years: 60.00    Types: Cigarettes  . Smokeless tobacco: Never Used  . Tobacco comment: quit in 1991-1992  Vaping Use  . Vaping Use: Never used  Substance Use Topics  . Alcohol use: Not Currently    Alcohol/week: 0.0 standard drinks    Comment: quit over 30 years ago  . Drug use: No   Allergies  Allergen Reactions  . Amoxicillin-Pot Clavulanate Nausea And Vomiting    DID THE REACTION INVOLVE: Swelling of the face/tongue/throat, SOB, or low BP? No Sudden or severe rash/hives, skin peeling, or the inside of the mouth or nose? No Did it require medical treatment? No When did it last happen?Over 10 years ago If all above answers are "NO", may proceed with cephalosporin use.       Medications: Outpatient Medications Prior to Visit  Medication Sig  . acetaminophen (TYLENOL) 650 MG CR tablet Take 650 mg by mouth every 8 (eight) hours as needed for pain.  . busPIRone (BUSPAR) 15 MG tablet Take 1 tablet (15 mg total) by mouth 2 (two) times daily.  Marland Kitchen donepezil (ARICEPT) 5 MG tablet Take 5 mg by mouth at bedtime.   . famotidine (PEPCID) 20 MG tablet Take 1 tablet (20 mg total) by mouth 2 (two) times daily.  Marland Kitchen FLUoxetine (PROZAC) 20 MG capsule Take 1 capsule (20 mg total) by mouth daily.  . meloxicam (MOBIC) 15 MG tablet Take 15 mg by mouth daily.   . simvastatin (ZOCOR) 20 MG tablet Take 1 tablet (20 mg  total) by mouth daily.  . [DISCONTINUED] albuterol (VENTOLIN HFA) 108 (90 Base) MCG/ACT inhaler Inhale 2 puffs into the lungs every 6 (six) hours as needed for wheezing or shortness of breath.  . [DISCONTINUED] doxycycline (VIBRA-TABS) 100 MG tablet Take 1 tablet (100 mg total) by mouth 2 (two) times daily. (Patient not taking: Reported on 04/10/2020)  . [DISCONTINUED] gabapentin (NEURONTIN) 400 MG capsule Take 1 capsule (400 mg total) by mouth 3 (three) times daily.  .  [DISCONTINUED] HYDROcodone-acetaminophen (NORCO) 5-325 MG tablet Take 1-2 tablets by mouth every 6 (six) hours as needed. (Patient not taking: Reported on 04/10/2020)   No facility-administered medications prior to visit.    Review of Systems  Constitutional: Negative for fever and weight loss.  Respiratory: Negative.   Cardiovascular: Negative.   Gastrointestinal: Positive for abdominal distention, abdominal pain, diarrhea, flatus and nausea. Negative for melena and vomiting.  Genitourinary: Negative for dysuria and hematuria.    Last CBC Lab Results  Component Value Date   WBC 5.5 04/16/2020   HGB 16.1 04/16/2020   HCT 47.1 04/16/2020   MCV 82 04/16/2020   MCH 28.1 04/16/2020   RDW 13.2 04/16/2020   PLT 232 09/26/1599   Last metabolic panel Lab Results  Component Value Date   GLUCOSE 107 (H) 04/16/2020   NA 142 04/16/2020   K 4.4 04/16/2020   CL 104 04/16/2020   CO2 24 04/16/2020   BUN 13 04/16/2020   CREATININE 1.01 04/16/2020   GFRNONAA 71 04/16/2020   GFRAA 83 04/16/2020   CALCIUM 10.0 04/16/2020   PROT 6.6 04/16/2020   ALBUMIN 4.4 04/16/2020   LABGLOB 2.2 04/16/2020   AGRATIO 2.0 04/16/2020   BILITOT 0.7 04/16/2020   ALKPHOS 120 04/16/2020   AST 24 04/16/2020   ALT 25 04/16/2020   ANIONGAP 11 11/24/2019   Last lipids Lab Results  Component Value Date   CHOL 171 04/16/2020   HDL 39 (L) 04/16/2020   LDLCALC 104 (H) 04/16/2020   TRIG 159 (H) 04/16/2020   CHOLHDL 4.4 04/16/2020        Objective    BP 128/80 (BP Location: Left Arm, Patient Position: Sitting, Cuff Size: Normal)   Pulse 63   Temp 98.2 F (36.8 C) (Oral)   Resp 16   Wt 195 lb 6.4 oz (88.6 kg)   BMI 28.86 kg/m  BP Readings from Last 3 Encounters:  05/08/20 128/80  04/10/20 (!) 171/81  02/06/20 (!) 178/77   Wt Readings from Last 3 Encounters:  05/08/20 195 lb 6.4 oz (88.6 kg)  04/10/20 198 lb 3.2 oz (89.9 kg)  02/01/20 191 lb (86.6 kg)      Physical Exam Vitals reviewed.   Constitutional:      General: He is not in acute distress.    Appearance: He is well-developed and well-nourished. He is obese. He is not ill-appearing.  HENT:     Head: Normocephalic and atraumatic.  Eyes:     Extraocular Movements: EOM normal.     Conjunctiva/sclera: Conjunctivae normal.  Pulmonary:     Effort: Pulmonary effort is normal. No respiratory distress.  Abdominal:     Tenderness: There is abdominal tenderness in the right upper quadrant and epigastric area.  Musculoskeletal:     Cervical back: Normal range of motion and neck supple.  Neurological:     Mental Status: He is alert.  Psychiatric:        Mood and Affect: Mood and affect normal.  Behavior: Behavior normal.        Thought Content: Thought content normal.        Judgment: Judgment normal.      No results found for any visits on 05/08/20.  Assessment & Plan     1. Colon cancer screening Patient requesting cologuard testing for colon cancer screening. Ordered as below. - Cologuard  2. Diarrhea, unspecified type Has improved over the last 2 days. Patient is having bloating after eating (worse with fatty foods) that last for 30 minutes to 2 hours with associated indigestion. Occasionally has diarrhea associated as well. Diarrhea is described as loose, watery stool. No hematochezia or melena. Diarrhea has not occurred over last 2 days but still had the bloating and indigestion after eating. Symptoms may be consistent with gallbladder disease/gallstones. Will get [place] as below for further evaluation. Call if worsening.  - US Abdomen Limited RUQ (LIVER/GB); Future  3. Abdominal bloating See above medical treatment plan. - US Abdomen Limited RUQ (LIVER/GB); Future  4. Nausea Mild. No treatment needed.  - US Abdomen Limited RUQ (LIVER/GB); Future  5. Pain of both shoulder joints Stable. Diagnosis pulled for medication refill. Continue current medical treatment plan. - meloxicam (MOBIC) 15 MG tablet;  Take 1 tablet (15 mg total) by mouth daily.  Dispense: 90 tablet; Refill: 1  6. Heart burn Will treat with omeprazole as below. Take in the morning before breakfast. Take famotidine in the evening if needed for breakthrough symptoms.  - omeprazole (PRILOSEC) 40 MG capsule; Take 1 capsule (40 mg total) by mouth daily.  Dispense: 90 capsule; Refill: 1   No follow-ups on file.      Reynolds Bowl, PA-C, have reviewed all documentation for this visit. The documentation on 05/08/20 for the exam, diagnosis, procedures, and orders are all accurate and complete.   [place]ye [place]  [facility] [phone] (phone) [phone] (fax)  [place]

## 2020-05-12 DIAGNOSIS — D225 Melanocytic nevi of trunk: Secondary | ICD-10-CM | POA: Diagnosis not present

## 2020-05-12 DIAGNOSIS — D2262 Melanocytic nevi of left upper limb, including shoulder: Secondary | ICD-10-CM | POA: Diagnosis not present

## 2020-05-12 DIAGNOSIS — D2261 Melanocytic nevi of right upper limb, including shoulder: Secondary | ICD-10-CM | POA: Diagnosis not present

## 2020-05-12 DIAGNOSIS — L538 Other specified erythematous conditions: Secondary | ICD-10-CM | POA: Diagnosis not present

## 2020-05-12 DIAGNOSIS — D2271 Melanocytic nevi of right lower limb, including hip: Secondary | ICD-10-CM | POA: Diagnosis not present

## 2020-05-12 DIAGNOSIS — L570 Actinic keratosis: Secondary | ICD-10-CM | POA: Diagnosis not present

## 2020-05-12 DIAGNOSIS — L820 Inflamed seborrheic keratosis: Secondary | ICD-10-CM | POA: Diagnosis not present

## 2020-05-12 DIAGNOSIS — D2272 Melanocytic nevi of left lower limb, including hip: Secondary | ICD-10-CM | POA: Diagnosis not present

## 2020-05-12 DIAGNOSIS — L821 Other seborrheic keratosis: Secondary | ICD-10-CM | POA: Diagnosis not present

## 2020-05-14 ENCOUNTER — Other Ambulatory Visit: Payer: Self-pay

## 2020-05-14 ENCOUNTER — Ambulatory Visit
Admission: RE | Admit: 2020-05-14 | Discharge: 2020-05-14 | Disposition: A | Discharge status: Home / Self Care | Payer: Medicare HMO | Source: Ambulatory Visit | Attending: Physician Assistant | Admitting: Physician Assistant

## 2020-05-14 DIAGNOSIS — R197 Diarrhea, unspecified: Secondary | ICD-10-CM

## 2020-05-14 DIAGNOSIS — R110 Nausea: Secondary | ICD-10-CM

## 2020-05-14 DIAGNOSIS — K8020 Calculus of gallbladder without cholecystitis without obstruction: Secondary | ICD-10-CM | POA: Diagnosis not present

## 2020-05-14 DIAGNOSIS — R140 Abdominal distension (gaseous): Secondary | ICD-10-CM | POA: Diagnosis not present

## 2020-05-15 ENCOUNTER — Other Ambulatory Visit: Payer: Self-pay | Admitting: Physician Assistant

## 2020-05-15 DIAGNOSIS — K8020 Calculus of gallbladder without cholecystitis without obstruction: Secondary | ICD-10-CM

## 2020-05-16 ENCOUNTER — Telehealth: Payer: Self-pay

## 2020-05-16 ENCOUNTER — Ambulatory Visit: Payer: Self-pay | Admitting: *Deleted

## 2020-05-16 DIAGNOSIS — Z1211 Encounter for screening for malignant neoplasm of colon: Secondary | ICD-10-CM | POA: Diagnosis not present

## 2020-05-16 NOTE — Telephone Encounter (Signed)
-----   Message from Mar Daring, [place] sent at 05/15/2020  5:46 PM EST ----- Justin Warren,  There are gallstones present in the gallbladder. I will refer you to general surgery for further evaluation.  Grace Bushy, [facility]

## 2020-05-16 NOTE — Telephone Encounter (Signed)
Pt given message per notes of J. Marlyn Corporal, PA from 05/15/20 on 05/16/20. Pt verbalized understanding. Patient asking if the referral would go to [facility]. Reported to patient , referral would be to a general surgery and no documentation noted which general surgery at this time. Reinforced that the surgeons office would contact him. Patient verbalized understanding .

## 2020-05-16 NOTE — Telephone Encounter (Signed)
Call patient and no answer. [place], if patient calls back okay for [facility] to advise.

## 2020-05-17 LAB — COLOGUARD: Cologuard: POSITIVE — AB

## 2020-05-18 ENCOUNTER — Other Ambulatory Visit: Payer: Self-pay | Admitting: Family Medicine

## 2020-05-18 DIAGNOSIS — F419 Anxiety disorder, unspecified: Secondary | ICD-10-CM

## 2020-05-19 ENCOUNTER — Telehealth: Payer: Self-pay

## 2020-05-19 NOTE — Telephone Encounter (Unsigned)
Copied from [place] [id number]. Topic: General - Call Back - No Documentation >> May 19, 2020  3:12 PM Justin Warren wrote: Reason for CRM: Pt is requesting a call back regarding his referral for GI issues Best contact: [phone]

## 2020-05-22 ENCOUNTER — Encounter: Payer: Self-pay | Admitting: Family Medicine

## 2020-05-23 ENCOUNTER — Telehealth: Payer: Self-pay

## 2020-05-23 LAB — COLOGUARD: COLOGUARD: POSITIVE — AB

## 2020-05-23 NOTE — Telephone Encounter (Signed)
Received Cologuard results and it was positive. This has been abstracted into his chart. Please review. Thanks!

## 2020-05-23 NOTE — Telephone Encounter (Signed)
Can we call and let him know and see if he has a preference on GI specialist.  Thank you

## 2020-05-26 ENCOUNTER — Telehealth: Payer: Self-pay

## 2020-05-26 NOTE — Telephone Encounter (Signed)
Copied from [place] [id number]. Topic: General - Other >> May 26, 2020  3:33 PM Leward Quan A wrote: Reason for CRM: Tarryn with [employer] called to inform that the report have been sent over with abnormal test results. Ph# [phone]

## 2020-05-26 NOTE — Telephone Encounter (Signed)
Copied from [place] [id number]. Topic: General - Other >> May 26, 2020  3:33 PM Leward Quan A wrote: Reason for CRM: Tarryn with [employer] called to inform that the report have been sent over with abnormal test results. Ph# [phone]

## 2020-05-26 NOTE — Telephone Encounter (Signed)
Pt called and is requesting to have nurse give him a call back to go over his results. Please advise.

## 2020-05-27 ENCOUNTER — Other Ambulatory Visit: Payer: Self-pay

## 2020-05-27 ENCOUNTER — Encounter: Payer: Self-pay | Admitting: Surgery

## 2020-05-27 ENCOUNTER — Ambulatory Visit (INDEPENDENT_AMBULATORY_CARE_PROVIDER_SITE_OTHER): Payer: Medicare HMO | Admitting: Surgery

## 2020-05-27 VITALS — BP 152/75 | HR 86 | Temp 98.3°F | Ht 69.0 in | Wt 196.0 lb

## 2020-05-27 DIAGNOSIS — K8020 Calculus of gallbladder without cholecystitis without obstruction: Secondary | ICD-10-CM | POA: Diagnosis not present

## 2020-05-27 NOTE — Progress Notes (Signed)
Patient ID: Justin Warren, male   DOB: Jan 13, 1946, 75 y.o.   MRN: [id number]  Chief Complaint: Gallstones  History of Present Illness Justin Warren is a 75 y.o. male with significant history of indigestion, diarrhea and excessive flatus.  Undergone GI work-up with Cologuard.  He was taking H2 blockers, most recently famotidine.  He was recently started on a PPI, and has become significantly better.  His work-up included an ultrasound which revealed gallstones without any other evidence of complication.  He denies any history of right upper quadrant or epigastric pain.  No history of postprandial nausea or vomiting.  No history of radiating pain to his back or his right flank area.  He does have a history of kidney stones which were asymptomatic at one point in time and then suddenly became problematic.  He has no interest in surgery if it is not necessary at this point in time.  Past Medical History Past Medical History:  Diagnosis Date  . Anxiety   . Bradycardia   . GERD (gastroesophageal reflux disease)   . History of kidney stones   . Hyperlipidemia   . Hypertension   . Kidney stone   . Neuropathy       Past Surgical History:  Procedure Laterality Date  . APPENDECTOMY  1994  . BACK SURGERY    . CARPAL TUNNEL RELEASE Right 2012  . CARPAL TUNNEL RELEASE Left 02/06/2020   Procedure: CARPAL TUNNEL RELEASE;  Surgeon: Earnestine Leys, MD;  Location: [facility] ORS;  Service: Orthopedics;  Laterality: Left;  BLOCK  . CATARACT EXTRACTION, BILATERAL  2019  . CYSTOSCOPY W/ RETROGRADES Right 11/30/2019   Procedure: CYSTOSCOPY WITH RETROGRADE PYELOGRAM;  Surgeon: [name], MD;  Location: [facility] ORS;  Service: Urology;  Laterality: Right;  . CYSTOSCOPY/URETEROSCOPY/HOLMIUM LASER/STENT PLACEMENT Right 11/30/2019   Procedure: CYSTOSCOPY/URETEROSCOPY/HOLMIUM LASER/STENT PLACEMENT;  Surgeon: [name], MD;  Location: [facility] ORS;  Service: Urology;  Laterality: Right;  . EYE SURGERY    . INSERT /  REPLACE / REMOVE PACEMAKER    . JOINT REPLACEMENT    . LUMBAR LAMINECTOMIES  1976 & 1978   Dr. [name]  . PACEMAKER INSERTION Left 04/20/2018   Procedure: INSERTION PACEMAKER-DUAL CHAMBER;  Surgeon: [name], MD;  Location: [facility] ORS;  Service: Cardiovascular;  Laterality: Left;  . 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 SIGMOIDOSCOPY  2010  . TRIGGER FINGER RELEASE Left 02/06/2020   Procedure: RELEASE TRIGGER FINGER/A-1 PULLEY;  Surgeon: Earnestine Leys, MD;  Location: [facility] ORS;  Service: Orthopedics;  Laterality: Left;    Allergies  Allergen Reactions  . Amoxicillin-Pot Clavulanate Nausea And Vomiting    DID THE REACTION INVOLVE: Swelling of the face/tongue/throat, SOB, or low BP? No Sudden or severe rash/hives, skin peeling, or the inside of the mouth or nose? No Did it require medical treatment? No When did it last happen?Over 10 years ago If all above answers are "NO", may proceed with cephalosporin use.    Current Outpatient Medications  Medication Sig Dispense Refill  . acetaminophen (TYLENOL) 650 MG CR tablet Take 650 mg by mouth every 8 (eight) hours as needed for pain.    . busPIRone (BUSPAR) 15 MG tablet TAKE 1 TABLET TWICE DAILY 180 tablet 0  . donepezil (ARICEPT) 5 MG tablet Take 5 mg by mouth at bedtime.     . famotidine (PEPCID) 20 MG tablet Take 1 tablet (20 mg total) by mouth 2 (two) times daily. (Patient taking differently: Take 20 mg by mouth at  bedtime.) 180 tablet 2  . FLUoxetine (PROZAC) 20 MG capsule Take 1 capsule (20 mg total) by mouth daily. 90 capsule 3  . meloxicam (MOBIC) 15 MG tablet Take 1 tablet (15 mg total) by mouth daily. 90 tablet 1  . omeprazole (PRILOSEC) 40 MG capsule Take 1 capsule (40 mg total) by mouth daily. 90 capsule 1  . simvastatin (ZOCOR) 20 MG tablet Take 1 tablet (20 mg total) by mouth daily. 90 tablet 3   No current facility-administered medications for this visit.    Family History Family History  Problem Relation Age  of Onset  . Dementia Mother   . GER disease Mother   . Hypertension Father   . Arrhythmia Sister   . Heart murmur Sister       Social History Social History   Tobacco Use  . Smoking status: Former Smoker    Packs/day: 2.00    Years: 30.00    Pack years: 60.00    Types: Cigarettes  . Smokeless tobacco: Never Used  . Tobacco comment: quit in 1991-1992  Vaping Use  . Vaping Use: Never used  Substance Use Topics  . Alcohol use: Not Currently    Alcohol/week: 0.0 standard drinks    Comment: quit over 30 years ago  . Drug use: No        Review of Systems  Constitutional: Negative.   HENT: Positive for hearing loss.   Eyes: Negative.   Respiratory: Positive for shortness of breath.   Cardiovascular: Negative.   Gastrointestinal: Positive for diarrhea and heartburn.  Genitourinary: Positive for frequency and urgency.  Skin: Negative.   Neurological: Positive for focal weakness (Drop Foot with poor balance.  Wears orthotics. ).  Psychiatric/Behavioral: Negative.       Physical Exam Blood pressure (!) 152/75, pulse 86, temperature 98.3 F (36.8 C), height 5' 9"  (1.753 m), weight 196 lb (88.9 kg), SpO2 97 %. Last Weight  Most recent update: 05/27/2020 10:03 AM   Weight  88.9 kg (196 lb)            CONSTITUTIONAL: Well developed, and nourished, appropriately responsive and aware without distress.   EYES: Sclera non-icteric.   EARS, NOSE, MOUTH AND THROAT: Mask worn.    Hearing is intact to voice.  RESPIRATORY:  Lungs are clear, normal respiratory effort without pathologic use of accessory muscles. CARDIOVASCULAR: Heart is regular in rate and rhythm. GI: The abdomen is soft, nontender, and nondistended. There were no palpable masses. I did not appreciate hepatosplenomegaly. There were normal bowel sounds. MUSCULOSKELETAL:  Symmetrical muscle tone appreciated in all four extremities.    SKIN: Skin turgor is normal. No pathologic skin lesions appreciated.  NEUROLOGIC:   Motor and sensation appear grossly normal.  Cranial nerves are grossly without defect. PSYCH:  Alert and oriented to person, place and time. Affect is appropriate for situation.  Data Reviewed I have personally reviewed what is currently available of the patient's imaging, recent labs and medical records.   Labs:  CBC Latest Ref Rng & Units 04/16/2020 11/24/2019 05/23/2019  WBC 3.4 - 10.8 x10E3/uL 5.5 8.9 4.2  Hemoglobin 13.0 - 17.7 g/dL 16.1 15.7 15.1  Hematocrit 37.5 - 51.0 % 47.1 44.5 45.2  Platelets 150 - 450 x10E3/uL 232 258 199   CMP Latest Ref Rng & Units 04/16/2020 11/24/2019 05/23/2019  Glucose 65 - 99 mg/dL 107(H) 142(H) 95  BUN 8 - 27 mg/dL 13 23 14   Creatinine 0.76 - 1.27 mg/dL 1.01 1.08 1.00  Sodium 134 -  144 mmol/L 142 134(L) 141  Potassium 3.5 - 5.2 mmol/L 4.4 3.2(L) 4.0  Chloride 96 - 106 mmol/L 104 98 104  CO2 20 - 29 mmol/L 24 25 24   Calcium 8.6 - 10.2 mg/dL 10.0 9.3 9.7  Total Protein 6.0 - 8.5 g/dL 6.6 7.1 6.3  Total Bilirubin 0.0 - 1.2 mg/dL 0.7 1.2 0.6  Alkaline Phos 44 - 121 IU/L 120 82 110  AST 0 - 40 IU/L 24 27 27   ALT 0 - 44 IU/L 25 23 24      Imaging: Radiology review:  CLINICAL DATA:  Nausea.  Vomiting.  Diarrhea.  Bloating.  EXAM: ULTRASOUND ABDOMEN LIMITED RIGHT UPPER QUADRANT  COMPARISON:  11/24/2019  FINDINGS: Gallbladder:  There are gallstones without evidence for acute cholecystitis. The sonographic Percell Miller sign is negative. There is no gallbladder wall thickening or pericholecystic free fluid.  Common bile duct:  Diameter: 4 mm  Liver:  No focal lesion identified. Within normal limits in parenchymal echogenicity. Portal vein is patent on color Doppler imaging with normal direction of blood flow towards the liver.  Other: None.  IMPRESSION: There is cholelithiasis without secondary signs of acute cholecystitis.   Electronically Signed   By: Constance Holster M.D.   On: 05/15/2020 17:21  Within last 24 hrs: No  results found.  Assessment    Cholelithiasis, seemingly asymptomatic at this time. Patient Active Problem List   Diagnosis Date Noted  . Pallor of extremity 01/15/2020  . Numbness and tingling of both legs below knees 06/10/2019  . Loss of memory 06/10/2019  . Difficulty walking 06/10/2019  . Ataxia 06/10/2019  . PAD (peripheral artery disease) ([place]) 05/25/2019  . History of total knee arthroplasty 12/07/2018  . CHB (complete heart block) ([place]) 04/20/2018  . Atrioventricular (AV) dissociation 04/06/2018  . Bradycardia 03/09/2018  . Atypical chest pain 03/09/2018  . Foot drop, bilateral 05/02/2017  . 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 Hx of decompressive lumbar laminectomy 04/09/2016  . Anxiety 03/07/2015  . Clinical depression 03/07/2015  . Difficulty hearing 03/07/2015  . Hypercholesteremia 03/07/2015  . 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 Acid reflux 06/26/2009  . External hemorrhoids without complication 65/68/1275  . 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  At the present time he does not appear to be suffering with any of these things specific for the gallbladder.  Feeling significantly better from his previous symptoms currently on a PPI.  We advised that surgery may be prophylactic to some degree, and like his prior experience with kidney stones could become emergent at some point in time as well.  We discussed the role of avoiding fatty foods and I believe this is something he is already doing and desires to continue.  We made both he and his wife well aware of various symptoms including even vague symptoms of postprandial nausea that may be related.  I believe at this point in time they do not desire to pursue elective cholecystectomy.  But are well aware and how to go should his  symptoms seem to indicate cholecystitis.  Face-to-face time spent with the patient and accompanying care providers(if present) was 30 minutes, with more than 50% of the time spent counseling, educating, and coordinating care of the patient.      Ronny Bacon M.D., FACS 05/27/2020, 10:45 AM

## 2020-05-27 NOTE — Telephone Encounter (Addendum)
See phone message from 05/23/2020. This already has been addressed by Fenton Malling.

## 2020-05-27 NOTE — Patient Instructions (Addendum)
We have seen you today for Gallstones.  Follow-up with our office as needed. Call us if you start to develop any gallbladder related symptoms.   Please call and ask to speak with a nurse if you develop questions or concerns.    Cholelithiasis Cholelithiasis is a form of gallbladder disease in which gallstones form in the gallbladder. The gallbladder is an organ that stores bile. Bile is made in the liver, and it helps to digest fats. Gallstones begin as small crystals and slowly grow into stones. They may cause no symptoms until the gallbladder tightens (contracts) and a gallstone is blocking the duct (gallbladder attack), which can cause pain. Cholelithiasis is also referred to as gallstones. There are two main types of gallstones:  Cholesterol stones. These are made of hardened cholesterol and are usually yellow-green in color. They are the most common type of gallstone. Cholesterol is a white, waxy, fat-like substance that is made in the liver.  Pigment stones. These are dark in color and are made of a red-yellow substance that forms when hemoglobin from red blood cells breaks down (bilirubin).  What are the causes? This condition may be caused by an imbalance in the substances that bile is made of. This can happen if the bile:  Has too much bilirubin.  Has too much cholesterol.  Does not have enough bile salts. These salts help the body absorb and digest fats.  In some cases, this condition can also be caused by the gallbladder not emptying completely or often enough. What increases the risk? The following factors may make you more likely to develop this condition:  Being male.  Having multiple pregnancies. Health care providers sometimes advise removing diseased gallbladders before future pregnancies.  Eating a diet that is heavy in fried foods, fat, and refined carbohydrates, like white bread and white rice.  Being obese.  Being older than age 23.  Prolonged use of  medicines that contain male hormones (estrogen).  Having diabetes mellitus.  Rapidly losing weight.  Having a family history of gallstones.  Being of [place] or [place] descent.  Having an intestinal disease such as Crohn disease.  Having metabolic syndrome.  Having cirrhosis.  Having severe types of anemia such as sickle cell anemia.  What are the signs or symptoms? In most cases, there are no symptoms. These are known as silent gallstones. If a gallstone blocks the bile ducts, it can cause a gallbladder attack. The main symptom of a gallbladder attack is sudden pain in the upper right abdomen. The pain usually comes at night or after eating a large meal. The pain can last for one or several hours and can spread to the right shoulder or chest. If the bile duct is blocked for more than a few hours, it can cause infection or inflammation of the gallbladder, liver, or pancreas, which may cause:  Nausea.  Vomiting.  Abdominal pain that lasts for 5 hours or more.  Fever or chills.  Yellowing of the skin or the whites of the eyes (jaundice).  Dark urine.  Light-colored stools.  How is this diagnosed? This condition may be diagnosed based on:  A physical exam.  Your medical history.  An ultrasound of your gallbladder.  CT scan.  MRI.  Blood tests to check for signs of infection or inflammation.  A scan of your gallbladder and bile ducts (biliary system) using nonharmful radioactive material and special cameras that can see the radioactive material (cholescintigram). This test checks to see how your  gallbladder contracts and whether bile ducts are blocked.  Inserting a small tube with a camera on the end (endoscope) through your mouth to inspect bile ducts and check for blockages (endoscopic retrograde cholangiopancreatogram).  How is this treated? Treatment for gallstones depends on the severity of the condition. Silent gallstones do not need treatment. If  the gallstones cause a gallbladder attack or other symptoms, treatment may be required. Options for treatment include:  Surgery to remove the gallbladder (cholecystectomy). This is the most common treatment.  Medicines to dissolve gallstones. These are most effective at treating small gallstones. You may need to take medicines for up to 6-12 months.  Shock wave treatment (extracorporeal biliary lithotripsy). In this treatment, an ultrasound machine sends shock waves to the gallbladder to break gallstones into smaller pieces. These pieces can then be passed into the intestines or be dissolved by medicine. This is rarely used.  Removing gallstones through endoscopic retrograde cholangiopancreatogram. A small basket can be attached to the endoscope and used to capture and remove gallstones.  Follow these instructions at home:  Take over-the-counter and prescription medicines only as told by your health care provider.  Maintain a healthy weight and follow a healthy diet. This includes: ? Reducing fatty foods, such as fried food. ? Reducing refined carbohydrates, like white bread and white rice. ? Increasing fiber. Aim for foods like almonds, fruit, and beans.  Keep all follow-up visits as told by your health care provider. This is important. Contact a health care provider if:  You think you have had a gallbladder attack.  You have been diagnosed with silent gallstones and you develop abdominal pain or indigestion. Get help right away if:  You have pain from a gallbladder attack that lasts for more than 2 hours.  You have abdominal pain that lasts for more than 5 hours.  You have a fever or chills.  You have persistent nausea and vomiting.  You develop jaundice.  You have dark urine or light-colored stools. Summary  Cholelithiasis (also called gallstones) is a form of gallbladder disease in which gallstones form in the gallbladder.  This condition is caused by an imbalance in the  substances that make up bile. This can happen if the bile has too much cholesterol, too much bilirubin, or not enough bile salts.  You are more likely to develop this condition if you are male, pregnant, using medicines with estrogen, obese, older than age 32, or have a family history of gallstones. You may also develop gallstones if you have diabetes, an intestinal disease, cirrhosis, or metabolic syndrome.  Treatment for gallstones depends on the severity of the condition. Silent gallstones do not need treatment.  If gallstones cause a gallbladder attack or other symptoms, treatment may be needed. The most common treatment is surgery to remove the gallbladder. This information is not intended to replace advice given to you by your health care provider. Make sure you discuss any questions you have with your health care provider. Document Released: 03/11/2005 Document Revised: 11/30/2015 Document Reviewed: 11/30/2015 Elsevier Interactive Patient Education  2017 [employer].

## 2020-05-27 NOTE — Telephone Encounter (Signed)
See phone message from 05/23/2020. This has already been addressed by Fenton Malling.

## 2020-05-28 ENCOUNTER — Telehealth: Payer: Self-pay | Admitting: Family Medicine

## 2020-05-28 DIAGNOSIS — R195 Other fecal abnormalities: Secondary | ICD-10-CM

## 2020-05-28 NOTE — Telephone Encounter (Signed)
Please let patient know its positive and if he has a preference on GI as noted in previous message on 05/23/20

## 2020-05-28 NOTE — Telephone Encounter (Signed)
Patient inquiring about cologuard results, please advise

## 2020-05-28 NOTE — Telephone Encounter (Signed)
Copied from [place] [phone]. Topic: Medicare AWV >> May 28, 2020  3:25 PM Cher Nakai R wrote: Reason for CRM:   No answer unable to leave a message for patient to call back and schedule Medicare Annual Wellness Visit (AWV) in office.   If not able to come in office, please offer to do virtually or by telephone.   Last AWV:05/21/2019  Please schedule at anytime with [facility] Health Advisor.  If any questions, please contact me at [phone]

## 2020-05-28 NOTE — Telephone Encounter (Signed)
Please review report it looks like it states that it was positive 05/23/20. Should I advise patient that center will be back in touch with him? Or does patient need to follow back up in regards to this?KW

## 2020-05-30 NOTE — Telephone Encounter (Signed)
See response below. KW

## 2020-05-30 NOTE — Telephone Encounter (Signed)
Patient advised and is agreeable, he states that he would like to see Dr. Lissa Merlin? If he is local. Justin Warren

## 2020-06-04 DIAGNOSIS — R195 Other fecal abnormalities: Secondary | ICD-10-CM | POA: Insufficient documentation

## 2020-06-04 NOTE — Addendum Note (Signed)
Addended by: Mar Daring on: 06/04/2020 01:21 PM   Modules accepted: Orders

## 2020-06-04 NOTE — Addendum Note (Signed)
Addended by: Mar Daring on: 06/04/2020 05:14 PM   Modules accepted: Orders

## 2020-06-04 NOTE — Telephone Encounter (Signed)
I think he means Dr. Christian Mate. I am not sure if he does colonoscopies. He is a Education officer, environmental. I can place referral to him, but we may have to change to GI specific for the colonoscopy.

## 2020-06-06 ENCOUNTER — Encounter: Payer: Self-pay | Admitting: Gastroenterology

## 2020-06-16 DIAGNOSIS — R200 Anesthesia of skin: Secondary | ICD-10-CM | POA: Diagnosis not present

## 2020-06-16 DIAGNOSIS — R270 Ataxia, unspecified: Secondary | ICD-10-CM | POA: Diagnosis not present

## 2020-06-16 DIAGNOSIS — M21371 Foot drop, right foot: Secondary | ICD-10-CM | POA: Diagnosis not present

## 2020-06-16 DIAGNOSIS — R262 Difficulty in walking, not elsewhere classified: Secondary | ICD-10-CM | POA: Diagnosis not present

## 2020-06-16 DIAGNOSIS — M21372 Foot drop, left foot: Secondary | ICD-10-CM | POA: Diagnosis not present

## 2020-06-16 DIAGNOSIS — R202 Paresthesia of skin: Secondary | ICD-10-CM | POA: Diagnosis not present

## 2020-06-16 DIAGNOSIS — R413 Other amnesia: Secondary | ICD-10-CM | POA: Diagnosis not present

## 2020-06-19 NOTE — Progress Notes (Addendum)
Subjective:   Justin Warren is a 75 y.o. male who presents for Medicare Annual/Subsequent preventive examination.  I connected with Donzetta Matters today by telephone and verified that I am speaking with the correct person using two identifiers. Location patient: home Location provider: work Persons participating in the virtual visit: patient, provider.   I discussed the limitations, risks, security and privacy concerns of performing an evaluation and management service by telephone and the availability of in person appointments. I also discussed with the patient that there may be a patient responsible charge related to this service. The patient expressed understanding and verbally consented to this telephonic visit.    Interactive audio and video telecommunications were attempted between this provider and patient, however failed, due to patient having technical difficulties OR patient did not have access to video capability.  We continued and completed visit with audio only.   Review of Systems    N/A  Cardiac Risk Factors include: advanced age (>26mn, >>66women);male gender;dyslipidemia;hypertension     Objective:    There were no vitals filed for this visit. There is no height or weight on file to calculate BMI.  Advanced Directives 06/23/2020 02/06/2020 02/01/2020 11/28/2019 11/24/2019 05/21/2019 05/18/2018  Does Patient Have a Medical Advance Directive? Yes Yes Yes Yes No;Yes Yes Yes  Type of AParamedicof AMimbresLiving will [place]Living will [place]Living will Living will Living will [place]apid CityLiving will [place]ff[place]Living will  Does patient want to make changes to medical advance directive? - No - Patient declined No - Patient declined - - - -  Copy of [place] Chart? No - copy requested No - copy requested No - copy requested - - No - copy requested -   Would patient like information on creating a medical advance directive? - - - - - - -    Current Medications (verified) Outpatient Encounter Medications as of 06/23/2020  Medication Sig   acetaminophen (TYLENOL) 650 MG CR tablet Take 650 mg by mouth every 8 (eight) hours as needed for pain.   busPIRone (BUSPAR) 15 MG tablet TAKE 1 TABLET TWICE DAILY   donepezil (ARICEPT) 5 MG tablet Take 5 mg by mouth at bedtime.    famotidine (PEPCID) 20 MG tablet Take 1 tablet (20 mg total) by mouth 2 (two) times daily. (Patient taking differently: Take 20 mg by mouth at bedtime.)   FLUoxetine (PROZAC) 20 MG capsule Take 1 capsule (20 mg total) by mouth daily.   meloxicam (MOBIC) 15 MG tablet Take 1 tablet (15 mg total) by mouth daily.   omeprazole (PRILOSEC) 40 MG capsule Take 1 capsule (40 mg total) by mouth daily.   simvastatin (ZOCOR) 20 MG tablet Take 1 tablet (20 mg total) by mouth daily.   No facility-administered encounter medications on file as of 06/23/2020.    Allergies (verified) Amoxicillin-pot clavulanate   History: Past Medical History:  Diagnosis Date   Anxiety    Bradycardia    GERD (gastroesophageal reflux disease)    History of kidney stones    Hyperlipidemia    Hypertension    Kidney stone    Neuropathy    Past Surgical History:  Procedure Laterality Date   APPENDECTOMY  1994   BACK SURGERY     CARPAL TUNNEL RELEASE Right 2012   CARPAL TUNNEL RELEASE Left 02/06/2020   Procedure: CARPAL TUNNEL RELEASE;  Surgeon: MEarnestine Leys MD;  Location: [facility] ORS;  Service: Orthopedics;  Laterality: Left;  BLOCK   CATARACT EXTRACTION, BILATERAL  2019   CYSTOSCOPY W/ RETROGRADES Right 11/30/2019   Procedure: CYSTOSCOPY WITH RETROGRADE PYELOGRAM;  Surgeon: [name], MD;  Location: [facility] ORS;  Service: Urology;  Laterality: Right;   CYSTOSCOPY/URETEROSCOPY/HOLMIUM LASER/STENT PLACEMENT Right 11/30/2019   Procedure: CYSTOSCOPY/URETEROSCOPY/HOLMIUM LASER/STENT PLACEMENT;  Surgeon:  [name], MD;  Location: [facility] ORS;  Service: Urology;  Laterality: Right;   EYE SURGERY     INSERT / REPLACE / REMOVE PACEMAKER     JOINT REPLACEMENT     LUMBAR LAMINECTOMIES  1976 & 1978   Dr. 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   SIGMOIDOSCOPY  2010   TRIGGER FINGER RELEASE Left 02/06/2020   Procedure: RELEASE TRIGGER FINGER/A-1 PULLEY;  Surgeon: Earnestine Leys, MD;  Location: [facility] ORS;  Service: Orthopedics;  Laterality: Left;   Family History  Problem Relation Age of Onset   Dementia Mother    GER disease Mother    Hypertension Father    Arrhythmia Sister    Heart murmur Sister    Social History   Socioeconomic History   Marital status: Married    Spouse name: Not on file   Number of children: 3   Years of education: Not on file   Highest education level: Bachelor's degree (e.g., BA, AB, BS)  Occupational History   Occupation: retired  Tobacco Use   Smoking status: Former Smoker    Packs/day: 2.00    Years: 30.00    Pack years: 60.00    Types: Cigarettes   Smokeless tobacco: Never Used   Tobacco comment: quit in 1991-1992  Vaping Use   Vaping Use: Never used  Substance and Sexual Activity   Alcohol use: Not Currently    Alcohol/week: 0.0 standard drinks    Comment: quit over 30 years ago   Drug use: No   Sexual activity: Yes    Birth control/protection: None  Other Topics Concern   Not on file  Social History Narrative   Not on file   Social Determinants of Health   Financial Resource Strain: Low Risk    Difficulty of Paying Living Expenses: Not hard at all  Food Insecurity: No Food Insecurity   Worried About Charity fundraiser in the Last Year: Never true   [place] in the Last Year: Never true  Transportation Needs: No Transportation Needs    Lack of Transportation (Medical): No   Lack of Transportation (Non-Medical): No  Physical Activity: Inactive   Days of Exercise per Week: 0 days   Minutes of Exercise per Session: 0 min  Stress: No Stress Concern Present   Feeling of Stress : Only a little  Social Connections: Moderately Integrated   Frequency of Communication with Friends and Family: More than three times a week   Frequency of Social Gatherings with Friends and Family: More than three times a week   Attends Religious Services: More than 4 times per year   Active Member of [employer] or Organizations: No   Attends Archivist Meetings: Never   Marital Status: Married    Tobacco Counseling Counseling given: Not Answered Comment: quit in 1991-1992   Clinical Intake:  Pre-visit preparation completed: Yes  Pain : No/denies pain     Nutritional Risks: None Diabetes: No  How often do you need to have someone help you when you read instructions, pamphlets, or other written materials from your doctor or pharmacy?: 1 - Never  Diabetic? No  Interpreter Needed?: No  Information entered by :: [facility], LPN   Activities of Daily Living In your present state of health, do you have any difficulty performing the following activities: 06/23/2020 05/08/2020  Hearing? Tempie Donning  Comment Wears bilateral hearing aids. -  Vision? N N  Difficulty concentrating or making decisions? Y N  Comment Currently on Aricept. -  Walking or climbing stairs? N Y  Dressing or bathing? N N  Doing errands, shopping? N N  Preparing Food and eating ? N -  Using the Toilet? N -  In the past six months, have you accidently leaked urine? N -  Do you have problems with loss of bowel control? N -  Managing your Medications? N -  Managing your Finances? N -  Housekeeping or managing your Housekeeping? N -  Some recent data might be hidden    Patient Care Team: Chrismon, Vickki Muff, PA-C as PCP - General (Family Medicine) Patty, A. Joneen Caraway,  MD as Consulting Physician (Ophthalmology) Ubaldo Glassing Javier Docker, MD as Consulting Physician (Cardiology) Lovell Sheehan, MD as Consulting Physician (Orthopedic Surgery) Anabel Bene, MD as Referring Physician (Neurology)  Indicate any recent Medical Services you may have received from other than [facility] providers in the past year (date may be approximate).     Assessment:   This is a routine wellness examination for [place].  Hearing/Vision screen No exam data present  Dietary issues and exercise activities discussed: Current Exercise Habits: The patient does not participate in regular exercise at present, Exercise limited by: None identified  Goals      DIET - INCREASE WATER INTAKE     Recommend increasing water intake to 4 glasses a day.      LIFESTYLE - DECREASE FALLS RISK     Recommend to remove any items from the home that may cause slips or trips.        Depression Screen PHQ 2/9 Scores 06/23/2020 05/08/2020 05/21/2019 05/18/2018 05/18/2018 04/15/2017 12/13/2016  PHQ - 2 Score 0 0 4 0 0 0 0  PHQ- 9 Score - 0 15 0 - - 4    Fall Risk Fall Risk  06/23/2020 05/08/2020 05/21/2019 05/18/2018 04/15/2017  Falls in the past year? 1 1 1 1  Yes  Number falls in past yr: 1 1 1 1 2  or more  Comment accidental falls - - - -  Injury with Fall? 0 0 1 0 No  Comment - - bruised ribs - -  Risk for fall due to : Impaired balance/gait History of fall(s) Impaired balance/gait Impaired mobility -  Follow up Falls prevention discussed Falls prevention discussed;Education provided Falls prevention discussed Falls prevention discussed Falls prevention discussed    FALL RISK PREVENTION PERTAINING TO THE HOME:  Any stairs in or around the home? Yes  If so, are there any without handrails? No  Home free of loose throw rugs in walkways, pet beds, electrical cords, etc? Yes  Adequate lighting in your home to reduce risk of falls? Yes   ASSISTIVE DEVICES UTILIZED TO PREVENT FALLS:  Life alert? No  Use of  a cane, walker or w/c? Yes  Grab bars in the bathroom? Yes  Shower chair or bench in shower? Yes  Elevated toilet seat or a handicapped toilet? Yes    Cognitive Function:   MMSE -  Mini Mental State Exam 05/21/2019  Orientation to time 4  Orientation to Place 5  Registration 3  Attention/ Calculation 5  Recall 2  Language- name 2 objects 2  Language- repeat 1  Language- follow 3 step command 3  Language- read & follow direction 1  Write a sentence 1  Copy design 1  Total score 28     6CIT Screen 05/21/2019 05/18/2018 04/15/2017  What Year? 0 points 0 points 0 points  What month? 0 points 0 points 0 points  What time? 0 points 0 points 0 points  Count back from 20 0 points 0 points 0 points  Months in reverse 0 points 0 points 0 points  Repeat phrase 0 points 0 points 0 points  Total Score 0 0 0    Immunizations Immunization History  Administered Date(s) Administered   Fluad Quad(high Dose 65+) 01/02/2020   Influenza Split 01/08/2010   Influenza, High Dose Seasonal PF 04/01/2014, 02/14/2018, 02/05/2019   Influenza,inj,Quad PF,6+ Mos 03/20/2013   Influenza-Unspecified 01/18/2015   PFIZER(Purple Top)SARS-COV-2 Vaccination 05/08/2019, 05/29/2019, 01/09/2020   Pneumococcal Conjugate-13 04/01/2014   Pneumococcal Polysaccharide-23 04/03/2015   Zoster 03/01/2011    TDAP status: Due, Education has been provided regarding the importance of this vaccine. Advised may receive this vaccine at local pharmacy or Health Dept. Aware to provide a copy of the vaccination record if obtained from local pharmacy or Health Dept. Verbalized acceptance and understanding.  Flu Vaccine status: Up to date  Pneumococcal vaccine status: Up to date  Covid-19 vaccine status: Completed vaccines  Qualifies for Shingles Vaccine? Yes   Zostavax completed Yes   Shingrix Completed?: No.    Education has been provided regarding the importance of this vaccine. Patient has been advised to call insurance  company to determine out of pocket expense if they have not yet received this vaccine. Advised may also receive vaccine at local pharmacy or Health Dept. Verbalized acceptance and understanding.  Screening Tests Health Maintenance  Topic Date Due   TETANUS/TDAP  06/23/2021 (Originally 03/29/2020)   COVID-19 Vaccine (4 - Booster for Pfizer series) 07/09/2020   INFLUENZA VACCINE  Completed   Hepatitis C Screening  Completed   PNA vac Low Risk Adult  Completed   HPV VACCINES  Aged Out    Health Maintenance  There are no preventive care reminders to display for this patient.  Colorectal cancer screening: No longer required.   Lung Cancer Screening: (Low Dose CT Chest recommended if Age 35-80 years, 30 pack-year currently smoking OR have quit w/in 15years.) does not qualify.   Additional Screening:  Hepatitis C Screening: Up to date  Vision Screening: Recommended annual ophthalmology exams for early detection of glaucoma and other disorders of the eye. Is the patient up to date with their annual eye exam?  Yes  Who is the provider or what is the name of the office in which the patient attends annual eye exams? [facility] If pt is not established with a provider, would they like to be referred to a provider to establish care? No .   Dental Screening: Recommended annual dental exams for proper oral hygiene  Community Resource Referral / Chronic Care Management: CRR required this visit?  No   CCM required this visit?  No      Plan:     I have personally reviewed and noted the following in the patient's chart:   Medical and social history Use of alcohol, tobacco or illicit drugs  Current medications and  supplements Functional ability and status Nutritional status Physical activity Advanced directives List of other physicians Hospitalizations, surgeries, and ER visits in previous 12 months Vitals Screenings to include cognitive, depression, and falls Referrals and  appointments  In addition, I have reviewed and discussed with patient certain preventive protocols, quality metrics, and best practice recommendations. A written personalized care plan for preventive services as well as general preventive health recommendations were provided to patient.     Cheyenne Bordeaux [place], [place]   08/24/4130   Nurse Notes: None.  Reviewed note of Nurse Health Advisor's screening. Agree with documentation and recommendations.

## 2020-06-23 ENCOUNTER — Other Ambulatory Visit: Payer: Self-pay

## 2020-06-23 ENCOUNTER — Ambulatory Visit (INDEPENDENT_AMBULATORY_CARE_PROVIDER_SITE_OTHER): Payer: Medicare HMO

## 2020-06-23 DIAGNOSIS — Z0000 Encounter for general adult medical examination without abnormal findings: Secondary | ICD-10-CM

## 2020-06-23 NOTE — Patient Instructions (Signed)
Mr. Justin Warren , Thank you for taking time to come for your Medicare Wellness Visit. I appreciate your ongoing commitment to your health goals. Please review the following plan we discussed and let me know if I can assist you in the future.   Screening recommendations/referrals: Colonoscopy: No longer required.  Recommended yearly ophthalmology/optometry visit for glaucoma screening and checkup Recommended yearly dental visit for hygiene and checkup  Vaccinations: Influenza vaccine: Done 01/02/20 Pneumococcal vaccine: Completed series Tdap vaccine: Currently due, declined receiving. Shingles vaccine: Shingrix discussed. Please contact your pharmacy for coverage information.     Advanced directives: Please bring a copy of your POA (Power of Attorney) and/or Living Will to your next appointment.   Conditions/risks identified: Fall risk preventatives discussed today. Recommend to continue to increase water intake to 6-8 8 oz glasses of water a day.  Next appointment: 07/08/20 @ 1:20 PM with [place] 65 Years and Older, Male Preventive care refers to lifestyle choices and visits with your health care provider that can promote health and wellness. What does preventive care include?  A yearly physical exam. This is also called an annual well check.  Dental exams once or twice a year.  Routine eye exams. Ask your health care provider how often you should have your eyes checked.  Personal lifestyle choices, including:  Daily care of your teeth and gums.  Regular physical activity.  Eating a healthy diet.  Avoiding tobacco and drug use.  Limiting alcohol use.  Practicing safe sex.  Taking low doses of aspirin every day.  Taking vitamin and mineral supplements as recommended by your health care provider. What happens during an annual well check? The services and screenings done by your health care provider during your annual well check will depend on your  age, overall health, lifestyle risk factors, and family history of disease. Counseling  Your health care provider may ask you questions about your:  Alcohol use.  Tobacco use.  Drug use.  Emotional well-being.  Home and relationship well-being.  Sexual activity.  Eating habits.  History of falls.  Memory and ability to understand (cognition).  Work and work Statistician. Screening  You may have the following tests or measurements:  Height, weight, and BMI.  Blood pressure.  Lipid and cholesterol levels. These may be checked every 5 years, or more frequently if you are over 59 years old.  Skin check.  Lung cancer screening. You may have this screening every year starting at age 16 if you have a 30-pack-year history of smoking and currently smoke or have quit within the past 15 years.  Fecal occult blood test (FOBT) of the stool. You may have this test every year starting at age 73.  Flexible sigmoidoscopy or colonoscopy. You may have a sigmoidoscopy every 5 years or a colonoscopy every 10 years starting at age 60.  Prostate cancer screening. Recommendations will vary depending on your family history and other risks.  Hepatitis C blood test.  Hepatitis B blood test.  Sexually transmitted disease (STD) testing.  Diabetes screening. This is done by checking your blood sugar (glucose) after you have not eaten for a while (fasting). You may have this done every 1-3 years.  Abdominal aortic aneurysm (AAA) screening. You may need this if you are a current or former smoker.  Osteoporosis. You may be screened starting at age 37 if you are at high risk. Talk with your health care provider about your test results, treatment options, and if necessary,  the need for more tests. Vaccines  Your health care provider may recommend certain vaccines, such as:  Influenza vaccine. This is recommended every year.  Tetanus, diphtheria, and acellular pertussis (Tdap, Td) vaccine. You  may need a Td booster every 10 years.  Zoster vaccine. You may need this after age 91.  Pneumococcal 13-valent conjugate (PCV13) vaccine. One dose is recommended after age 50.  Pneumococcal polysaccharide (PPSV23) vaccine. One dose is recommended after age 57. Talk to your health care provider about which screenings and vaccines you need and how often you need them. This information is not intended to replace advice given to you by your health care provider. Make sure you discuss any questions you have with your health care provider. Document Released: 04/11/2015 Document Revised: 12/03/2015 Document Reviewed: 01/14/2015 Elsevier Interactive Patient Education  2017 [employer] Prevention in the Home Falls can cause injuries. They can happen to people of all ages. There are many things you can do to make your home safe and to help prevent falls. What can I do on the outside of my home?  Regularly fix the edges of walkways and driveways and fix any cracks.  Remove anything that might make you trip as you walk through a door, such as a raised step or threshold.  Trim any bushes or trees on the path to your home.  Use bright outdoor lighting.  Clear any walking paths of anything that might make someone trip, such as rocks or tools.  Regularly check to see if handrails are loose or broken. Make sure that both sides of any steps have handrails.  Any raised decks and porches should have guardrails on the edges.  Have any leaves, snow, or ice cleared regularly.  Use sand or salt on walking paths during winter.  Clean up any spills in your garage right away. This includes oil or grease spills. What can I do in the bathroom?  Use night lights.  Install grab bars by the toilet and in the tub and shower. Do not use towel bars as grab bars.  Use non-skid mats or decals in the tub or shower.  If you need to sit down in the shower, use a plastic, non-slip stool.  Keep the floor  dry. Clean up any water that spills on the floor as soon as it happens.  Remove soap buildup in the tub or shower regularly.  Attach bath mats securely with double-sided non-slip rug tape.  Do not have throw rugs and other things on the floor that can make you trip. What can I do in the bedroom?  Use night lights.  Make sure that you have a light by your bed that is easy to reach.  Do not use any sheets or blankets that are too big for your bed. They should not hang down onto the floor.  Have a firm chair that has side arms. You can use this for support while you get dressed.  Do not have throw rugs and other things on the floor that can make you trip. What can I do in the kitchen?  Clean up any spills right away.  Avoid walking on wet floors.  Keep items that you use a lot in easy-to-reach places.  If you need to reach something above you, use a strong step stool that has a grab bar.  Keep electrical cords out of the way.  Do not use floor polish or wax that makes floors slippery. If you must use  wax, use non-skid floor wax.  Do not have throw rugs and other things on the floor that can make you trip. What can I do with my stairs?  Do not leave any items on the stairs.  Make sure that there are handrails on both sides of the stairs and use them. Fix handrails that are broken or loose. Make sure that handrails are as long as the stairways.  Check any carpeting to make sure that it is firmly attached to the stairs. Fix any carpet that is loose or worn.  Avoid having throw rugs at the top or bottom of the stairs. If you do have throw rugs, attach them to the floor with carpet tape.  Make sure that you have a light switch at the top of the stairs and the bottom of the stairs. If you do not have them, ask someone to add them for you. What else can I do to help prevent falls?  Wear shoes that:  Do not have high heels.  Have rubber bottoms.  Are comfortable and fit you  well.  Are closed at the toe. Do not wear sandals.  If you use a stepladder:  Make sure that it is fully opened. Do not climb a closed stepladder.  Make sure that both sides of the stepladder are locked into place.  Ask someone to hold it for you, if possible.  Clearly mark and make sure that you can see:  Any grab bars or handrails.  First and last steps.  Where the edge of each step is.  Use tools that help you move around (mobility aids) if they are needed. These include:  Canes.  Walkers.  Scooters.  Crutches.  Turn on the lights when you go into a dark area. Replace any light bulbs as soon as they burn out.  Set up your furniture so you have a clear path. Avoid moving your furniture around.  If any of your floors are uneven, fix them.  If there are any pets around you, be aware of where they are.  Review your medicines with your doctor. Some medicines can make you feel dizzy. This can increase your chance of falling. Ask your doctor what other things that you can do to help prevent falls. This information is not intended to replace advice given to you by your health care provider. Make sure you discuss any questions you have with your health care provider. Document Released: 01/09/2009 Document Revised: 08/21/2015 Document Reviewed: 04/19/2014 Elsevier Interactive Patient Education  2017 [employer].

## 2020-07-02 ENCOUNTER — Other Ambulatory Visit: Payer: Self-pay

## 2020-07-02 ENCOUNTER — Encounter: Payer: Self-pay | Admitting: Gastroenterology

## 2020-07-02 ENCOUNTER — Ambulatory Visit: Payer: Medicare HMO | Admitting: Gastroenterology

## 2020-07-02 VITALS — BP 162/85 | HR 60 | Temp 98.3°F | Ht 69.0 in | Wt 200.0 lb

## 2020-07-02 DIAGNOSIS — K219 Gastro-esophageal reflux disease without esophagitis: Secondary | ICD-10-CM | POA: Diagnosis not present

## 2020-07-02 DIAGNOSIS — R195 Other fecal abnormalities: Secondary | ICD-10-CM | POA: Diagnosis not present

## 2020-07-02 DIAGNOSIS — K529 Noninfective gastroenteritis and colitis, unspecified: Secondary | ICD-10-CM

## 2020-07-02 MED ORDER — NA SULFATE-K SULFATE-MG SULF 17.5-3.13-1.6 GM/177ML PO SOLN: 354.0000 mL | Freq: Once | ORAL | 0 refills | Status: AC

## 2020-07-02 NOTE — Addendum Note (Signed)
Addended by: Ulyess Blossom L on: 07/02/2020 02:23 PM   Modules accepted: Orders

## 2020-07-02 NOTE — Progress Notes (Signed)
Cephas Darby, MD [address]  [place]  [place],  [address]  Main: [phone]  Fax: [phone]    Gastroenterology Consultation  Referring Provider:     Florian Buff* Primary Care Physician:  Margo Common, PA-C Primary Gastroenterologist:  Dr. Cephas Darby Reason for Consultation:     Chronic diarrhea and positive Cologuard        HPI:   Justin Warren is a 75 y.o. male referred by Dr. Natale Milch, Vickki Muff, PA-C  for consultation & management of chronic diarrhea.  Patient reports that he was experiencing several episodes of nonbloody diarrhea during Christmas time.  He started on Prilosec 40 mg daily which has slowed his diarrhea.  He reports having 2-3 soft bowel movements daily.  Denies any rectal bleeding.  He has history of chronic GERD, previously on H2 blockers, recently switched to PPI.  He is also found to have positive Cologuard test.  He denies any GI symptoms.  Patient is accompanied by his wife today  NSAIDs: None  Antiplts/Anticoagulants/Anti thrombotics: None  GI Procedures: Colonoscopy in 2014 Diminutive polyp in the transverse colon was removed, pathology showed hyperplastic polyp  Past Medical History:  Diagnosis Date  . Anxiety   . Bradycardia   . GERD (gastroesophageal reflux disease)   . History of kidney stones   . Hyperlipidemia   . Hypertension   . Kidney stone   . Neuropathy     Past Surgical History:  Procedure Laterality Date  . APPENDECTOMY  1994  . BACK SURGERY    . CARPAL TUNNEL RELEASE Right 2012  . CARPAL TUNNEL RELEASE Left 02/06/2020   Procedure: CARPAL TUNNEL RELEASE;  Surgeon: Earnestine Leys, MD;  Location: [facility];  Service: Orthopedics;  Laterality: Left;  BLOCK  . CATARACT EXTRACTION, BILATERAL  2019  . CYSTOSCOPY W/ RETROGRADES Right 11/30/2019   Procedure: CYSTOSCOPY WITH RETROGRADE PYELOGRAM;  Surgeon: [name], MD;  Location: [facility];  Service: Urology;  Laterality: Right;  .  CYSTOSCOPY/URETEROSCOPY/HOLMIUM LASER/STENT PLACEMENT Right 11/30/2019   Procedure: CYSTOSCOPY/URETEROSCOPY/HOLMIUM LASER/STENT PLACEMENT;  Surgeon: [name], MD;  Location: [facility];  Service: Urology;  Laterality: Right;  . EYE SURGERY    . INSERT / REPLACE / REMOVE PACEMAKER    . JOINT REPLACEMENT    . LUMBAR LAMINECTOMIES  1976 & 1978   Dr. [name]  . PACEMAKER INSERTION Left 04/20/2018   Procedure: INSERTION PACEMAKER-DUAL CHAMBER;  Surgeon: [name], MD;  Location: [facility];  Service: Cardiovascular;  Laterality: Left;  . 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 SIGMOIDOSCOPY  2010  . TRIGGER FINGER RELEASE Left 02/06/2020   Procedure: RELEASE TRIGGER FINGER/A-1 PULLEY;  Surgeon: Earnestine Leys, MD;  Location: [facility];  Service: Orthopedics;  Laterality: Left;     Current Outpatient Medications:  .  acetaminophen (TYLENOL) 650 MG CR tablet, Take 650 mg by mouth every 8 (eight) hours as needed for pain., Disp: , Rfl:  .  busPIRone (BUSPAR) 15 MG tablet, TAKE 1 TABLET TWICE DAILY, Disp: 180 tablet, Rfl: 0 .  donepezil (ARICEPT) 5 MG tablet, Take 5 mg by mouth at bedtime. , Disp: , Rfl:  .  FLUoxetine (PROZAC) 20 MG capsule, Take 1 capsule (20 mg total) by mouth daily., Disp: 90 capsule, Rfl: 3 .  meloxicam (MOBIC) 15 MG tablet, Take 1 tablet (15 mg total) by mouth daily., Disp: 90 tablet, Rfl: 1 .  omeprazole (PRILOSEC) 40 MG capsule, Take 1 capsule (40 mg  total) by mouth daily., Disp: 90 capsule, Rfl: 1 .  simvastatin (ZOCOR) 20 MG tablet, Take 1 tablet (20 mg total) by mouth daily., Disp: 90 tablet, Rfl: 3  Family History  Problem Relation Age of Onset  . Dementia Mother   . GER disease Mother   . Hypertension Father   . Arrhythmia Sister   . Heart murmur Sister      Social History   Tobacco Use  . Smoking status: Former Smoker    Packs/day: 2.00    Years: 30.00    Pack years: 60.00    Types: Cigarettes  . Smokeless tobacco: Never Used  . Tobacco  comment: quit in 1991-1992  Vaping Use  . Vaping Use: Never used  Substance Use Topics  . Alcohol use: Not Currently    Alcohol/week: 0.0 standard drinks    Comment: quit over 30 years ago  . Drug use: No    Allergies as of 07/02/2020 - Review Complete 07/02/2020  Allergen Reaction Noted  . Amoxicillin-pot clavulanate Nausea And Vomiting 03/07/2015    Review of Systems:    All systems reviewed and negative except where noted in HPI.   Physical Exam:  BP (!) 162/85 (BP Location: Left Arm, Patient Position: Sitting, Cuff Size: Normal)   Pulse 60   Temp 98.3 F (36.8 C) (Oral)   Ht 5' 9"  (1.753 m)   Wt 200 lb (90.7 kg)   BMI 29.53 kg/m  No LMP for male patient.  General:   Alert,  Well-developed, well-nourished, pleasant and cooperative in NAD Head:  Normocephalic and atraumatic. Eyes:  Sclera clear, no icterus.   Conjunctiva pink. Ears:  Normal auditory acuity. Nose:  No deformity, discharge, or lesions. Mouth:  No deformity or lesions,oropharynx pink & moist. Neck:  Supple; no masses or thyromegaly. Lungs:  Respirations even and unlabored.  Clear throughout to auscultation.   No wheezes, crackles, or rhonchi. No acute distress. Heart:  Regular rate and rhythm; no murmurs, clicks, rubs, or gallops. Abdomen:  Normal bowel sounds. Soft, non-tender and non-distended without masses, hepatosplenomegaly or hernias noted.  No guarding or rebound tenderness.   Rectal: Not performed Msk:  Symmetrical without gross deformities. Good, equal movement & strength bilaterally. Pulses:  Normal pulses noted. Extremities:  No clubbing or edema.  No cyanosis. Neurologic:  Alert and oriented x3;  grossly normal neurologically. Skin:  Intact without significant lesions or rashes. No jaundice. Psych:  Alert and cooperative. Normal mood and affect.  Imaging Studies: Reviewed  Assessment and Plan:   Justin Warren is a 75 y.o. male with history of nephrolithiasis, asymptomatic  cholelithiasis is seen in consultation for chronic GERD, nonbloody diarrhea which is currently resolved as well as positive Cologuard Recommend upper endoscopy for Barrett's screening given history of chronic GERD and obesity Recommend colonoscopy for positive Cologuard test and random colon biopsies for previous history of nonbloody diarrhea   Follow up based on the above work-up   Cephas Darby, MD

## 2020-07-07 ENCOUNTER — Other Ambulatory Visit: Payer: Medicare HMO

## 2020-07-08 ENCOUNTER — Encounter: Payer: Self-pay | Admitting: Family Medicine

## 2020-07-09 ENCOUNTER — Other Ambulatory Visit: Payer: Self-pay

## 2020-07-09 ENCOUNTER — Ambulatory Visit
Admission: RE | Admit: 2020-07-09 | Discharge: 2020-07-09 | Disposition: A | Discharge status: Home / Self Care | Payer: Medicare HMO | Attending: Gastroenterology | Admitting: Gastroenterology

## 2020-07-09 ENCOUNTER — Ambulatory Visit: Payer: Medicare HMO | Admitting: Anesthesiology

## 2020-07-09 ENCOUNTER — Ambulatory Visit: Payer: Self-pay | Admitting: Urology

## 2020-07-09 ENCOUNTER — Encounter
Admission: RE | Disposition: A | Discharge status: Home / Self Care | Payer: Self-pay | Source: Home / Self Care | Attending: Gastroenterology

## 2020-07-09 DIAGNOSIS — D122 Benign neoplasm of ascending colon: Secondary | ICD-10-CM | POA: Diagnosis not present

## 2020-07-09 DIAGNOSIS — N200 Calculus of kidney: Secondary | ICD-10-CM | POA: Diagnosis not present

## 2020-07-09 DIAGNOSIS — Z950 Presence of cardiac pacemaker: Secondary | ICD-10-CM | POA: Insufficient documentation

## 2020-07-09 DIAGNOSIS — I10 Essential (primary) hypertension: Secondary | ICD-10-CM | POA: Diagnosis not present

## 2020-07-09 DIAGNOSIS — Z791 Long term (current) use of non-steroidal anti-inflammatories (NSAID): Secondary | ICD-10-CM | POA: Insufficient documentation

## 2020-07-09 DIAGNOSIS — K2289 Other specified disease of esophagus: Secondary | ICD-10-CM | POA: Insufficient documentation

## 2020-07-09 DIAGNOSIS — Z96651 Presence of right artificial knee joint: Secondary | ICD-10-CM | POA: Diagnosis not present

## 2020-07-09 DIAGNOSIS — Z13810 Encounter for screening for upper gastrointestinal disorder: Secondary | ICD-10-CM | POA: Diagnosis not present

## 2020-07-09 DIAGNOSIS — R195 Other fecal abnormalities: Secondary | ICD-10-CM | POA: Insufficient documentation

## 2020-07-09 DIAGNOSIS — K635 Polyp of colon: Secondary | ICD-10-CM | POA: Diagnosis not present

## 2020-07-09 DIAGNOSIS — E785 Hyperlipidemia, unspecified: Secondary | ICD-10-CM | POA: Diagnosis not present

## 2020-07-09 DIAGNOSIS — Z87891 Personal history of nicotine dependence: Secondary | ICD-10-CM | POA: Diagnosis not present

## 2020-07-09 DIAGNOSIS — K449 Diaphragmatic hernia without obstruction or gangrene: Secondary | ICD-10-CM | POA: Insufficient documentation

## 2020-07-09 DIAGNOSIS — Z880 Allergy status to penicillin: Secondary | ICD-10-CM | POA: Diagnosis not present

## 2020-07-09 DIAGNOSIS — K219 Gastro-esophageal reflux disease without esophagitis: Secondary | ICD-10-CM | POA: Diagnosis not present

## 2020-07-09 DIAGNOSIS — Z79899 Other long term (current) drug therapy: Secondary | ICD-10-CM | POA: Diagnosis not present

## 2020-07-09 DIAGNOSIS — G629 Polyneuropathy, unspecified: Secondary | ICD-10-CM | POA: Insufficient documentation

## 2020-07-09 DIAGNOSIS — K2270 Barrett's esophagus without dysplasia: Secondary | ICD-10-CM | POA: Diagnosis not present

## 2020-07-09 DIAGNOSIS — Z8249 Family history of ischemic heart disease and other diseases of the circulatory system: Secondary | ICD-10-CM | POA: Diagnosis not present

## 2020-07-09 DIAGNOSIS — M1990 Unspecified osteoarthritis, unspecified site: Secondary | ICD-10-CM | POA: Diagnosis not present

## 2020-07-09 DIAGNOSIS — K22719 Barrett's esophagus with dysplasia, unspecified: Secondary | ICD-10-CM | POA: Diagnosis not present

## 2020-07-09 DIAGNOSIS — E7800 Pure hypercholesterolemia, unspecified: Secondary | ICD-10-CM | POA: Diagnosis not present

## 2020-07-09 HISTORY — PX: COLONOSCOPY WITH PROPOFOL: SHX5780

## 2020-07-09 HISTORY — PX: ESOPHAGOGASTRODUODENOSCOPY (EGD) WITH PROPOFOL: SHX5813

## 2020-07-09 SURGERY — COLONOSCOPY WITH PROPOFOL
Anesthesia: General

## 2020-07-09 MED ORDER — PROPOFOL 10 MG/ML IV BOLUS
INTRAVENOUS | Status: DC | PRN
  Administered 2020-07-09: 40 mg via INTRAVENOUS

## 2020-07-09 MED ORDER — PROPOFOL 500 MG/50ML IV EMUL
INTRAVENOUS | Status: DC | PRN
  Administered 2020-07-09: 125 ug/kg/min via INTRAVENOUS

## 2020-07-09 MED ORDER — SODIUM CHLORIDE 0.9 % IV SOLN: INTRAVENOUS | Status: DC

## 2020-07-09 MED ORDER — LIDOCAINE HCL (CARDIAC) PF 100 MG/5ML IV SOSY
PREFILLED_SYRINGE | INTRAVENOUS | Status: DC | PRN
  Administered 2020-07-09: 50 mg via INTRAVENOUS

## 2020-07-09 NOTE — Op Note (Signed)
[facility] Gastroenterology Patient Name: Justin Warren Procedure Date: 07/09/2020 9:23 AM MRN: [id number] Account #: [id number] Date of Birth: Nov 15, 1945 Admit Type: Outpatient Age: 75 Room: [facility] ENDO ROOM 3 Gender: Male Note Status: Finalized Procedure:             Colonoscopy Indications:           Last colonoscopy: September 2014, Positive Cologuard                         test Providers:             Lin Landsman MD, MD Referring MD:          Vickki Muff. Chrismon, MD (Referring MD) Medicines:             General Anesthesia Complications:         No immediate complications. Estimated blood loss: None. Procedure:             Pre-Anesthesia Assessment:                        - Prior to the procedure, a History and Physical was                         performed, and patient medications and allergies were                         reviewed. The patient is competent. The risks and                         benefits of the procedure and the sedation options and                         risks were discussed with the patient. All questions                         were answered and informed consent was obtained.                         Patient identification and proposed procedure were                         verified by the physician, the nurse, the                         anesthesiologist, the anesthetist and the technician                         in the pre-procedure area in the procedure room in the                         endoscopy suite. Mental Status Examination: alert and                         oriented. Airway Examination: normal oropharyngeal                         airway and neck mobility. Respiratory Examination:  clear to auscultation. CV Examination: normal.                         Prophylactic Antibiotics: The patient does not require                         prophylactic antibiotics. Prior Anticoagulants: The                          patient has taken no previous anticoagulant or                         antiplatelet agents. ASA Grade Assessment: III - A                         patient with severe systemic disease. After reviewing                         the risks and benefits, the patient was deemed in                         satisfactory condition to undergo the procedure. The                         anesthesia plan was to use general anesthesia.                         Immediately prior to administration of medications,                         the patient was re-assessed for adequacy to receive                         sedatives. The heart rate, respiratory rate, oxygen                         saturations, blood pressure, adequacy of pulmonary                         ventilation, and response to care were monitored                         throughout the procedure. The physical status of the                         patient was re-assessed after the procedure.                        After obtaining informed consent, the colonoscope was                         passed under direct vision. Throughout the procedure,                         the patient's blood pressure, pulse, and oxygen                         saturations were monitored continuously. The  Colonoscope was introduced through the anus and                         advanced to the the cecum, identified by appendiceal                         orifice and ileocecal valve. The colonoscopy was                         performed without difficulty. The patient tolerated                         the procedure well. The quality of the bowel                         preparation was evaluated using the BBPS [facility] Bowel                         Preparation Scale) with scores of: Right Colon = 3,                         Transverse Colon = 3 and Left Colon = 3 (entire mucosa                         seen well with no residual staining, small fragments                          of stool or opaque liquid). The total BBPS score                         equals 9. Findings:      The perianal and digital rectal examinations were normal. Pertinent       negatives include normal sphincter tone and no palpable rectal lesions.      Two sessile polyps were found in the ascending colon. The polyps were 4       to 5 mm in size. These polyps were removed with a cold snare. Resection       and retrieval were complete.      The retroflexed view of the distal rectum and anal verge was normal and       showed no anal or rectal abnormalities. Impression:            - Two 4 to 5 mm polyps in the ascending colon, removed                         with a cold snare. Resected and retrieved.                        - The distal rectum and anal verge are normal on                         retroflexion view. Recommendation:        - Discharge patient to home (with escort).                        - Resume previous diet today.                        -  Continue present medications.                        - Await pathology results.                        - Repeat colonoscopy in 5 years for surveillance. Procedure Code(s):     --- Professional ---                        [phone], Colonoscopy, flexible; with removal of                         tumor(s), polyp(s), or other lesion(s) by snare                         technique Diagnosis Code(s):     --- Professional ---                        K63.5, Polyp of colon                        R19.5, Other fecal abnormalities CPT copyright 2019 American Medical Association. All rights reserved. The codes documented in this report are preliminary and upon coder review may  be revised to meet current compliance requirements. Dr. Ulyess Mort Lin Landsman MD, MD 07/09/2020 9:58:27 AM This report has been signed electronically. Number of Addenda: 0 Note Initiated On: 07/09/2020 9:23 AM Scope Withdrawal Time: 0 hours 6 minutes 36  seconds  Total Procedure Duration: 0 hours 13 minutes 41 seconds  Estimated Blood Loss:  Estimated blood loss: none.      [facility]

## 2020-07-09 NOTE — Anesthesia Postprocedure Evaluation (Signed)
Anesthesia Post Note  Patient: Justin Warren  Procedure(s) Performed: COLONOSCOPY WITH PROPOFOL (N/A ) ESOPHAGOGASTRODUODENOSCOPY (EGD) WITH PROPOFOL (N/A )  Patient location during evaluation: Endoscopy Anesthesia Type: General Level of consciousness: awake and alert and oriented Pain management: pain level controlled Vital Signs Assessment: post-procedure vital signs reviewed and stable Respiratory status: spontaneous breathing, nonlabored ventilation and respiratory function stable Cardiovascular status: blood pressure returned to baseline and stable Postop Assessment: no signs of nausea or vomiting Anesthetic complications: no   No complications documented.   Last Vitals:  Vitals:   07/09/20 1010 07/09/20 1020  BP: (!) 122/98 (!) 144/89  Pulse: (!) 107 93  Resp: 20 15  Temp:    SpO2: 96% 99%    Last Pain:  Vitals:   07/09/20 1020  TempSrc:   PainSc: 0-No pain                 Elizebath Wever

## 2020-07-09 NOTE — Anesthesia Preprocedure Evaluation (Signed)
Anesthesia Evaluation  Patient identified by MRN, date of birth, ID band Patient awake    Reviewed: Allergy & Precautions, NPO status , Patient's Chart, lab work & pertinent test results  History of Anesthesia Complications Negative for: history of anesthetic complications  Airway Mallampati: II  TM Distance: >3 FB Neck ROM: Full    Dental  (+) Poor Dentition   Pulmonary neg sleep apnea, neg COPD, former smoker,    breath sounds clear to auscultation- rhonchi (-) wheezing      Cardiovascular Exercise Tolerance: Good hypertension, Pt. on medications (-) CAD, (-) Past MI, (-) Cardiac Stents and (-) CABG + dysrhythmias + pacemaker  Rhythm:Regular Rate:Normal - Systolic murmurs and - Diastolic murmurs    Neuro/Psych PSYCHIATRIC DISORDERS Anxiety Depression negative neurological ROS     GI/Hepatic Neg liver ROS, GERD  ,  Endo/Other  negative endocrine ROSneg diabetes  Renal/GU Renal disease: hx of nephrolithiasis.     Musculoskeletal  (+) Arthritis ,   Abdominal (+) - obese,   Peds  Hematology negative hematology ROS (+)   Anesthesia Other Findings Past Medical History: No date: Anxiety No date: Bradycardia No date: GERD (gastroesophageal reflux disease) No date: History of kidney stones No date: Hyperlipidemia No date: Hypertension No date: Kidney stone No date: Neuropathy   Reproductive/Obstetrics                             Anesthesia Physical Anesthesia Plan  ASA: III  Anesthesia Plan: General   Post-op Pain Management:    Induction: Intravenous  PONV Risk Score and Plan: 1 and Propofol infusion  Airway Management Planned: Natural Airway  Additional Equipment:   Intra-op Plan:   Post-operative Plan:   Informed Consent: I have reviewed the patients History and Physical, chart, labs and discussed the procedure including the risks, benefits and alternatives for the  proposed anesthesia with the patient or authorized representative who has indicated his/her understanding and acceptance.     Dental advisory given  Plan Discussed with: CRNA and Anesthesiologist  Anesthesia Plan Comments:         Anesthesia Quick Evaluation

## 2020-07-09 NOTE — Anesthesia Procedure Notes (Signed)
Procedure Name: MAC Date/Time: 07/09/2020 9:29 AM Performed by: Jerrye Noble, CRNA Pre-anesthesia Checklist: Patient identified, Emergency Drugs available, Suction available and Patient being monitored Patient Re-evaluated:Patient Re-evaluated prior to induction Oxygen Delivery Method: Nasal cannula

## 2020-07-09 NOTE — H&P (Signed)
Cephas Darby, MD [place]  [place], [place] [address]  Main: [phone]  Fax: [phone] Pager: [phone]  Primary Care Physician:  Margo Common, PA-C Primary Gastroenterologist:  Dr. Cephas Darby  Pre-Procedure History & Physical: HPI:  Justin Warren is a 75 y.o. male is here for an endoscopy and colonoscopy.   Past Medical History:  Diagnosis Date  . Anxiety   . Bradycardia   . GERD (gastroesophageal reflux disease)   . History of kidney stones   . Hyperlipidemia   . Hypertension   . Kidney stone   . Neuropathy     Past Surgical History:  Procedure Laterality Date  . APPENDECTOMY  1994  . BACK SURGERY    . CARPAL TUNNEL RELEASE Right 2012  . CARPAL TUNNEL RELEASE Left 02/06/2020   Procedure: CARPAL TUNNEL RELEASE;  Surgeon: Earnestine Leys, MD;  Location: [facility] ORS;  Service: Orthopedics;  Laterality: Left;  BLOCK  . CATARACT EXTRACTION, BILATERAL  2019  . CYSTOSCOPY W/ RETROGRADES Right 11/30/2019   Procedure: CYSTOSCOPY WITH RETROGRADE PYELOGRAM;  Surgeon: [name], MD;  Location: [facility];  Service: Urology;  Laterality: Right;  . CYSTOSCOPY/URETEROSCOPY/HOLMIUM LASER/STENT PLACEMENT Right 11/30/2019   Procedure: CYSTOSCOPY/URETEROSCOPY/HOLMIUM LASER/STENT PLACEMENT;  Surgeon: [name], MD;  Location: [facility] OR[facility];  Service: Urology;  Laterality: Right;  . EYE SURGERY    . INSERT / REPLACE / REMOVE PACEMAKER    . JOINT REPLACEMENT    . LUMBAR LAMINECTOMIES  1976 & 1978   Dr. [name]  . PACEMAKER INSERTION Left 04/20/2018   Procedure: INSERTION PACEMAKER-DUAL CHAMBER;  Surgeon: [name], MD;  Location: [facility];  Service: Cardiovascular;  Laterality: Left;  . 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 SIGMOIDOSCOPY  2010  . TRIGGER FINGER RELEASE Left 02/06/2020   Procedure: RELEASE TRIGGER FINGER/A-1 PULLEY;  Surgeon: Earnestine Leys, MD;  Location: [facility] OR[facility];  Service: Orthopedics;  Laterality: Left;    Prior  to Admission medications   Medication Sig Start Date End Date Taking? Authorizing Provider  busPIRone (BUSPAR) 15 MG tablet TAKE 1 TABLET TWICE DAILY 05/18/20  Yes Chrismon, Simona Huh E, PA-C  donepezil (ARICEPT) 5 MG tablet Take 5 mg by mouth at bedtime.  08/29/19  Yes [provider]  FLUoxetine (PROZAC) 20 MG capsule Take 1 capsule (20 mg total) by mouth daily. 06/20/19  Yes Chrismon, Vickki Muff, PA-C  meloxicam (MOBIC) 15 MG tablet Take 1 tablet (15 mg total) by mouth daily. 05/08/20  Yes Mar Daring, PA-C  omeprazole (PRILOSEC) 40 MG capsule Take 1 capsule (40 mg total) by mouth daily. 05/08/20  Yes Fenton Malling M, PA-C  simvastatin (ZOCOR) 20 MG tablet Take 1 tablet (20 mg total) by mouth daily. 03/10/20  Yes Chrismon, Vickki Muff, PA-C  acetaminophen (TYLENOL) 650 MG CR tablet Take 650 mg by mouth every 8 (eight) hours as needed for pain.    [provider]    Allergies as of 07/02/2020 - Review Complete 07/02/2020  Allergen Reaction Noted  . Amoxicillin-pot clavulanate Nausea And Vomiting 03/07/2015    Family History  Problem Relation Age of Onset  . Dementia Mother   . GER disease Mother   . Hypertension Father   . Arrhythmia Sister   . Heart murmur Sister     Social History   Socioeconomic History  . Marital status: Married    Spouse name: Not on file  . Number of children: 3  . Years of education: Not  on file  . Highest education level: Bachelor's degree (e.g., BA, AB, BS)  Occupational History  . Occupation: retired  Tobacco Use  . Smoking status: Former Smoker    Packs/day: 2.00    Years: 30.00    Pack years: 60.00    Types: Cigarettes  . Smokeless tobacco: Never Used  . Tobacco comment: quit in 1991-1992  Vaping Use  . Vaping Use: Never used  Substance and Sexual Activity  . Alcohol use: Not Currently    Alcohol/week: 0.0 standard drinks    Comment: quit over 30 years ago  . Drug use: No  . Sexual activity: Yes    Birth  control/protection: None  Other Topics Concern  . Not on file  Social History Narrative  . Not on file   Social Determinants of Health   Financial Resource Strain: Low Risk   . Difficulty of Paying Living Expenses: Not hard at all  Food Insecurity: No Food Insecurity  . Worried About Charity fundraiser in the Last Year: Never true  . Ran Out of Food in the Last Year: Never true  Transportation Needs: No Transportation Needs  . Lack of Transportation (Medical): No  . Lack of Transportation (Non-Medical): No  Physical Activity: Inactive  . Days of Exercise per Week: 0 days  . Minutes of Exercise per Session: 0 min  Stress: No Stress Concern Present  . Feeling of Stress : Only a little  Social Connections: Moderately Integrated  . Frequency of Communication with Friends and Family: More than three times a week  . Frequency of Social Gatherings with Friends and Family: More than three times a week  . Attends Religious Services: More than 4 times per year  . Active Member of Clubs or Organizations: No  . Attends Archivist Meetings: Never  . Marital Status: Married  Human resources officer Violence: Not At Risk  . Fear of Current or Ex-Partner: No  . Emotionally Abused: No  . Physically Abused: No  . Sexually Abused: No    Review of Systems: See HPI, otherwise negative ROS  Physical Exam: BP (!) 148/91   Pulse 80   Temp 97.6 F (36.4 C) (Temporal)   Resp 16   Ht 5' 9"  (1.753 m)   Wt 88.5 kg   SpO2 96%   BMI 28.80 kg/m  General:   Alert,  pleasant and cooperative in NAD Head:  Normocephalic and atraumatic. Neck:  Supple; no masses or thyromegaly. Lungs:  Clear throughout to auscultation.    Heart:  Regular rate and rhythm. Abdomen:  Soft, nontender and nondistended. Normal bowel sounds, without guarding, and without rebound.   Neurologic:  Alert and  oriented x4;  grossly normal neurologically.  Impression/Plan: Justin Warren is here for an endoscopy and  colonoscopy to be performed for chronic gerd and positive cologaurd  Risks, benefits, limitations, and alternatives regarding  endoscopy and colonoscopy have been reviewed with the patient.  Questions have been answered.  All parties agreeable.   Sherri Sear, MD  07/09/2020, 9:19 AM

## 2020-07-09 NOTE — Op Note (Signed)
[facility] Gastroenterology Patient Name: Justin Warren Procedure Date: 07/09/2020 9:24 AM MRN: [id number] Account #: [id number] Date of Birth: 1945-09-30 Admit Type: Outpatient Age: 75 Room: [facility] END[facility] ROOM 3 Gender: Male Note Status: Finalized Procedure:             Upper GI endoscopy Indications:           Screening for Barrett's esophagus, Screening for                         Barrett's esophagus in patient at risk for this                         condition, Heartburn Providers:             Lin Landsman MD, MD Referring MD:          Vickki Muff. Chrismon, MD (Referring MD) Medicines:             General Anesthesia Complications:         No immediate complications. Estimated blood loss: None. Procedure:             Pre-Anesthesia Assessment:                        - Prior to the procedure, a History and Physical was                         performed, and patient medications and allergies were                         reviewed. The patient is competent. The risks and                         benefits of the procedure and the sedation options and                         risks were discussed with the patient. All questions                         were answered and informed consent was obtained.                         Patient identification and proposed procedure were                         verified by the physician, the nurse, the                         anesthesiologist, the anesthetist and the technician                         in the pre-procedure area in the procedure room in the                         endoscopy suite. Mental Status Examination: alert and                         oriented. Airway Examination: normal oropharyngeal  airway and neck mobility. Respiratory Examination:                         clear to auscultation. CV Examination: normal.                         Prophylactic Antibiotics: The patient does not require                          prophylactic antibiotics. Prior Anticoagulants: The                         patient has taken no previous anticoagulant or                         antiplatelet agents. ASA Grade Assessment: III - A                         patient with severe systemic disease. After reviewing                         the risks and benefits, the patient was deemed in                         satisfactory condition to undergo the procedure. The                         anesthesia plan was to use general anesthesia.                         Immediately prior to administration of medications,                         the patient was re-assessed for adequacy to receive                         sedatives. The heart rate, respiratory rate, oxygen                         saturations, blood pressure, adequacy of pulmonary                         ventilation, and response to care were monitored                         throughout the procedure. The physical status of the                         patient was re-assessed after the procedure.                        After obtaining informed consent, the endoscope was                         passed under direct vision. Throughout the procedure,                         the patient's blood pressure, pulse, and oxygen  saturations were monitored continuously. The Endoscope                         was introduced through the mouth, and advanced to the                         second part of duodenum. The upper GI endoscopy was                         accomplished without difficulty. The patient tolerated                         the procedure well. Findings:      The duodenal bulb and second portion of the duodenum were normal.      The entire examined stomach was normal.      The exam was otherwise without abnormality.      A small hiatal hernia was present.      Esophagogastric landmarks were identified: the gastroesophageal junction       was  found at 36 cm from the incisors.      Two tongues of salmon-colored mucosa were present from 35 to 36 cm. No       other visible abnormalities were present. The maximum longitudinal       extent of these esophageal mucosal changes was 1 cm in length. Biopsies       were taken with a cold forceps for histology. Estimated blood loss: none. Impression:            - Normal duodenal bulb and second portion of the                         duodenum.                        - Normal stomach.                        - The examination was otherwise normal.                        - Small hiatal hernia.                        - Esophagogastric landmarks identified.                        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 Biopsied. Recommendation:        - Follow an antireflux regimen for the rest of the                         patient's life.                        - Use a proton pump inhibitor PO daily indefinitely.                        - Proceed with colonoscopy as scheduled  See colonoscopy report                        - Await pathology results. Procedure Code(s):     --- Professional ---                        [phone], Esophagogastroduodenoscopy, flexible,                         transoral; with biopsy, single or multiple Diagnosis Code(s):     --- Professional ---                        K22.8, Other specified diseases of esophagus                        K44.9, Diaphragmatic hernia without obstruction or                         gangrene                        Z13.810, Encounter for screening for upper                         gastrointestinal disorder                        R12, Heartburn CPT copyright 2019 American Medical Association. All rights reserved. The codes documented in this report are preliminary and upon coder review may  be revised to meet current compliance requirements. Dr. Ulyess Mort Lin Landsman MD, MD 07/09/2020 9:40:50 AM This report has been signed electronically. Number of Addenda: 0 Note Initiated On: 07/09/2020 9:24 AM Estimated Blood Loss:  Estimated blood loss: none.      [facility]

## 2020-07-09 NOTE — Transfer of Care (Signed)
Immediate Anesthesia Transfer of Care Note  Patient: Justin Warren  Procedure(s) Performed: COLONOSCOPY WITH PROPOFOL (N/A ) ESOPHAGOGASTRODUODENOSCOPY (EGD) WITH PROPOFOL (N/A )  Patient Location: PACU and Endoscopy Unit  Anesthesia Type:General  Level of Consciousness: drowsy and patient cooperative  Airway & Oxygen Therapy: Patient Spontanous Breathing  Post-op Assessment: Report given to RN and Post -op Vital signs reviewed and stable  Post vital signs: Reviewed and stable  Last Vitals:  Vitals Value Taken Time  BP 137/85 07/09/20 1000  Temp 36.4 C 07/09/20 1000  Pulse 98 07/09/20 1000  Resp 20 07/09/20 1000  SpO2      Last Pain:  Vitals:   07/09/20 1000  TempSrc: Temporal  PainSc: Asleep         Complications: No complications documented.

## 2020-07-10 ENCOUNTER — Encounter: Payer: Self-pay | Admitting: Gastroenterology

## 2020-07-10 LAB — SURGICAL PATHOLOGY

## 2020-07-16 ENCOUNTER — Other Ambulatory Visit: Payer: Self-pay

## 2020-07-16 ENCOUNTER — Encounter: Payer: Self-pay | Admitting: Urology

## 2020-07-16 ENCOUNTER — Ambulatory Visit: Payer: Self-pay | Admitting: *Deleted

## 2020-07-16 ENCOUNTER — Ambulatory Visit: Payer: Medicare HMO | Admitting: Urology

## 2020-07-16 ENCOUNTER — Ambulatory Visit: Payer: Self-pay | Admitting: Urology

## 2020-07-16 VITALS — BP 197/91 | HR 59 | Ht 69.0 in | Wt 198.5 lb

## 2020-07-16 DIAGNOSIS — N200 Calculus of kidney: Secondary | ICD-10-CM

## 2020-07-16 DIAGNOSIS — N529 Male erectile dysfunction, unspecified: Secondary | ICD-10-CM

## 2020-07-16 MED ORDER — TADALAFIL 5 MG PO TABS: 5.0000 mg | ORAL_TABLET | Freq: Every day | ORAL | 11 refills | Status: DC | PRN

## 2020-07-16 NOTE — Telephone Encounter (Signed)
Patient is calling to report he has been to see several providers over the last few weeks and his BP seems to be staying elevated. Patient states today at his urology appointment- it was very elevated. Patient does not have a way to check his BP at home- but reports he has been weak after 30 minutes of activity and he does have SOB with exertion. Appointment scheduled for tomorrow- without knowing what BP reading is now- advised patient if he should have any chest pain or increase in symptoms- go to ED- appointment previously scheduled has been moved up.  Reason for Disposition . Systolic BP  >= 715 OR Diastolic >= 806  Answer Assessment - Initial Assessment Questions 1. BLOOD PRESSURE: "What is the blood pressure?" "Did you take at least two measurements 5 minutes apart?"     197/91 2. ONSET: "When did you take your blood pressure?"     11:00 3. HOW: "How did you obtain the blood pressure?" (e.g., visiting nurse, automatic home BP monitor)    Urology ofice 4. HISTORY: "Do you have a history of high blood pressure?"     yes 5. MEDICATIONS: "Are you taking any medications for blood pressure?" "Have you missed any doses recently?"     no 6. OTHER SYMPTOMS: "Do you have any symptoms?" (e.g., headache, chest pain, blurred vision, difficulty breathing, weakness)     Little short of breath, gets fatigued 7. PREGNANCY: "Is there any chance you are pregnant?" "When was your last menstrual period?"     n/a  Protocols used: BLOOD PRESSURE - HIGH-A-AH

## 2020-07-16 NOTE — Telephone Encounter (Signed)
FYI

## 2020-07-16 NOTE — Progress Notes (Signed)
   07/16/2020 10:50 AM   Justin Warren Nov 11, 1945 [id number]  Reason for visit: History of kidney stones, new ED  HPI: 75 year old male with history of kidney stones and underwent right ureteroscopy and laser lithotripsy of a proximal ureteral stone with me in September 2021, here today to discuss new erectile dysfunction.  He reports he still able to have erections sufficient for penetration intercourse, but he has trouble maintaining erections and they are not as firm as they used to be.  He has never tried any medications for this.  He is interested in if over-the-counter medications or CBD would be helpful.  We discussed PDE 5 inhibitors at length as well as the risks and benefits.  I recommended a trial of Cialis 5 mg to be taken either on demand, or every other day.  He is currently sexually active 1-2 times per week.  We also discussed considering a testosterone value in the future pending his response to Cialis.  We discussed general stone prevention strategies including adequate hydration with goal of producing 2.5 L of urine daily, increasing citric acid intake, increasing calcium intake during high oxalate meals, minimizing animal protein, and decreasing salt intake. Information about dietary recommendations given today.   -Trial of Cialis 5 mg on demand for ED-> he will call to let us know how he is doing in the next few weeks or if the dose needs to be adjusted -RTC 1 year KUB for stone surveillance  [name], MD  [facility] [address], [place], [place] [address] [phone]

## 2020-07-16 NOTE — Patient Instructions (Signed)
Erectile Dysfunction Erectile dysfunction (ED) is the inability to get or keep an erection in order to have sexual intercourse. ED is considered a symptom of an underlying disorder and not considered a disease. Erectile dysfunction may include:  Inability to get an erection.  Lack of enough hardness of the erection to allow penetration.  Loss of the erection before sex is finished. What are the causes? This condition may be caused by:  Certain medicines, such as: ? Pain relievers. ? Antihistamines. ? Antidepressants. ? Blood pressure medicines. ? Water pills (diuretics). ? Ulcer medicines. ? Muscle relaxants. ? Drugs.  Excessive drinking.  Psychological causes, such as: ? Anxiety. ? Depression. ? Sadness. ? Exhaustion. ? Performance fear. ? Stress.  Physical causes, such as: ? Artery problems. This may include diabetes, smoking, liver disease, or atherosclerosis. ? High blood pressure. ? Hormonal problems, such as low testosterone. ? Obesity. ? Nerve problems. This may include back or pelvic injuries, diabetes mellitus, multiple sclerosis, or Parkinson's disease. What are the signs or symptoms? Symptoms of this condition include:  Inability to get an erection.  Lack of enough hardness of the erection to allow penetration.  Loss of the erection before sex is finished.  Normal erections at some times, but with frequent unsatisfactory episodes.  Low sexual satisfaction in either partner due to erection problems.  A curved penis occurring with erection. The curve may cause pain or the penis may be too curved to allow for intercourse.  Never having nighttime erections. How is this diagnosed? This condition is often diagnosed by:  Performing a physical exam to find other diseases or specific problems with the penis.  Asking you detailed questions about the problem.  Performing blood tests to check for diabetes mellitus or to measure hormone levels.  Performing  other tests to check for underlying health conditions.  Performing an ultrasound exam to check for scarring.  Performing a test to check blood flow to the penis.  Doing a sleep study at home to measure nighttime erections. How is this treated? This condition may be treated by:  Medicine taken by mouth to help you achieve an erection (oral medicine).  Hormone replacement therapy to replace low testosterone levels.  Medicine that is injected into the penis. Your health care provider may instruct you how to give yourself these injections at home.  Vacuum pump. This is a pump with a ring on it. The pump and ring are placed on the penis and used to create pressure that helps the penis become erect.  Penile implant surgery. In this procedure, you may receive: ? An inflatable implant. This consists of cylinders, a pump, and a reservoir. The cylinders can be inflated with a fluid that helps to create an erection, and they can be deflated after intercourse. ? A semi-rigid implant. This consists of two silicone rubber rods. The rods provide some rigidity. They are also flexible, so the penis can both curve downward in its normal position and become straight for sexual intercourse.  Blood vessel surgery, to improve blood flow to the penis. During this procedure, a blood vessel from a different part of the body is placed into the penis to allow blood to flow around (bypass) damaged or blocked blood vessels.  Lifestyle changes, such as exercising more, losing weight, and quitting smoking. Follow these instructions at home: Medicines  Take over-the-counter and prescription medicines only as told by your health care provider. Do not increase the dosage without first discussing it with your health care  provider.  If you are using self-injections, perform injections as directed by your health care provider. Make sure to avoid any veins that are on the surface of the penis. After giving an injection,  apply pressure to the injection site for 5 minutes.   General instructions  Exercise regularly, as directed by your health care provider. Work with your health care provider to lose weight, if needed.  Do not use any products that contain nicotine or tobacco, such as cigarettes and e-cigarettes. If you need help quitting, ask your health care provider.  Before using a vacuum pump, read the instructions that come with the pump and discuss any questions with your health care provider.  Keep all follow-up visits as told by your health care provider. This is important. Contact a health care provider if:  You feel nauseous.  You vomit. Get help right away if:  You are taking oral or injectable medicines and you have an erection that lasts longer than 4 hours. If your health care provider is unavailable, go to the nearest emergency room for evaluation. An erection that lasts much longer than 4 hours can result in permanent damage to your penis.  You have severe pain in your groin or abdomen.  You develop redness or severe swelling of your penis.  You have redness spreading up into your groin or lower abdomen.  You are unable to urinate.  You experience chest pain or a rapid heart beat (palpitations) after taking oral medicines. Summary  Erectile dysfunction (ED) is the inability to get or keep an erection during sexual intercourse. This problem can usually be treated successfully.  This condition is diagnosed based on a physical exam, your symptoms, and tests to determine the cause. Treatment varies depending on the cause and may include medicines, hormone therapy, surgery, or a vacuum pump.  You may need follow-up visits to make sure that you are using your medicines or devices correctly.  Get help right away if you are taking or injecting medicines and you have an erection that lasts longer than 4 hours. This information is not intended to replace advice given to you by your health  care provider. Make sure you discuss any questions you have with your health care provider. Document Revised: 11/30/2019 Document Reviewed: 11/30/2019 Elsevier Patient Education  2021 [employer] (5th ed., pp. [phone]). [place], [place]: Elsevier.">  Dietary Guidelines to Help Prevent Kidney Stones Kidney stones are deposits of minerals and salts that form inside your kidneys. Your risk of developing kidney stones may be greater depending on your diet, your lifestyle, the medicines you take, and whether you have certain medical conditions. Most people can lower their chances of developing kidney stones by following the instructions below. Your dietitian may give you more specific instructions depending on your overall health and the type of kidney stones you tend to develop. What are tips for following this plan? Reading food labels  Choose foods with "no salt added" or "low-salt" labels. Limit your salt (sodium) intake to less than 1,500 mg a day.  Choose foods with calcium for each meal and snack. Try to eat about 300 mg of calcium at each meal. Foods that contain 200-500 mg of calcium a serving include: ? 8 oz (237 mL) of milk, calcium-fortifiednon-dairy milk, and calcium-fortifiedfruit juice. Calcium-fortified means that calcium has been added to these drinks. ? 8 oz (237 mL) of kefir, yogurt, and soy yogurt. ? 4 oz (114 g) of tofu. ?  1 oz (28 g) of cheese. ? 1 cup (150 g) of dried figs. ? 1 cup (91 g) of cooked broccoli. ? One 3 oz (85 g) can of sardines or mackerel. Most people need 1,000-1,500 mg of calcium a day. Talk to your dietitian about how much calcium is recommended for you.   Shopping  Buy plenty of fresh fruits and vegetables. Most people do not need to avoid fruits and vegetables, even if these foods contain nutrients that may contribute to kidney stones.  When shopping for convenience foods, choose: ? Whole pieces of fruit. ? Pre-made  salads with dressing on the side. ? Low-fat fruit and yogurt smoothies.  Avoid buying frozen meals or prepared deli foods. These can be high in sodium.  Look for foods with live cultures, such as yogurt and kefir.  Choose high-fiber grains, such as whole-wheat breads, oat bran, and wheat cereals. Cooking  Do not add salt to food when cooking. Place a salt shaker on the table and allow each person to add his or her own salt to taste.  Use vegetable protein, such as beans, textured vegetable protein (TVP), or tofu, instead of meat in pasta, casseroles, and soups. Meal planning  Eat less salt, if told by your dietitian. To do this: ? Avoid eating processed or pre-made food. ? Avoid eating fast food.  Eat less animal protein, including cheese, meat, poultry, or fish, if told by your dietitian. To do this: ? Limit the number of times you have meat, poultry, fish, or cheese each week. Eat a diet free of meat at least 2 days a week. ? Eat only one serving each day of meat, poultry, fish, or seafood. ? When you prepare animal protein, cut pieces into small portion sizes. For most meat and fish, one serving is about the size of the palm of your hand.  Eat at least five servings of fresh fruits and vegetables each day. To do this: ? Keep fruits and vegetables on hand for snacks. ? Eat one piece of fruit or a handful of berries with breakfast. ? Have a salad and fruit at lunch. ? Have two kinds of vegetables at dinner.  Limit foods that are high in a substance called oxalate. These include: ? Spinach (cooked), rhubarb, beets, sweet potatoes, and Swiss chard. ? Peanuts. ? Potato chips, french fries, and baked potatoes with skin on. ? Nuts and nut products. ? Chocolate.  If you regularly take a diuretic medicine, make sure to eat at least 1 or 2 servings of fruits or vegetables that are high in potassium each day. These include: ? Avocado. ? Banana. ? Orange, prune, carrot, or tomato  juice. ? Baked potato. ? Cabbage. ? Beans and split peas. Lifestyle  Drink enough fluid to keep your urine pale yellow. This is the most important thing you can do. Spread your fluid intake throughout the day.  If you drink alcohol: ? Limit how much you use to:  0-1 drink a day for women who are not pregnant.  0-2 drinks a day for men. ? Be aware of how much alcohol is in your drink. In the [place] one drink equals one 12 oz bottle of beer (355 mL), one 5 oz glass of wine (148 mL), or one 1 oz glass of hard liquor (44 mL).  Lose weight if told by your health care provider. Work with your dietitian to find an eating plan and weight loss strategies that work best for you.   General  information  Talk to your health care provider and dietitian about taking daily supplements. You may be told the following depending on your health and the cause of your kidney stones: ? Not to take supplements with vitamin C. ? To take a calcium supplement. ? To take a daily probiotic supplement. ? To take other supplements such as magnesium, fish oil, or vitamin B6.  Take over-the-counter and prescription medicines only as told by your health care provider. These include supplements. What foods should I limit? Limit your intake of the following foods, or eat them as told by your dietitian. Vegetables Spinach. Rhubarb. Beets. Canned vegetables. Angie Fava. Olives. Baked potatoes with skin. Grains Wheat bran. Baked goods. Salted crackers. Cereals high in sugar. Meats and other proteins Nuts. Nut butters. Large portions of meat, poultry, or fish. Salted, precooked, or cured meats, such as sausages, meat loaves, and hot dogs. Dairy Cheese. Beverages Regular soft drinks. Regular vegetable juice. Seasonings and condiments Seasoning blends with salt. Salad dressings. Soy sauce. Ketchup. Barbecue sauce. Other foods Canned soups. Canned pasta sauce. Casseroles. Pizza. Lasagna. Frozen meals. Potato chips. [place]  fries. The items listed above may not be a complete list of foods and beverages you should limit. Contact a dietitian for more information. What foods should I avoid? Talk to your dietitian about specific foods you should avoid based on the type of kidney stones you have and your overall health. Fruits Grapefruit. The item listed above may not be a complete list of foods and beverages you should avoid. Contact a dietitian for more information. Summary  Kidney stones are deposits of minerals and salts that form inside your kidneys.  You can lower your risk of kidney stones by making changes to your diet.  The most important thing you can do is drink enough fluid. Drink enough fluid to keep your urine pale yellow.  Talk to your dietitian about how much calcium you should have each day, and eat less salt and animal protein as told by your dietitian. This information is not intended to replace advice given to you by your health care provider. Make sure you discuss any questions you have with your health care provider. Document Revised: 03/08/2019 Document Reviewed: 03/08/2019 Elsevier Patient Education  2021 [employer].

## 2020-07-17 ENCOUNTER — Ambulatory Visit (INDEPENDENT_AMBULATORY_CARE_PROVIDER_SITE_OTHER): Payer: Medicare HMO | Admitting: Family Medicine

## 2020-07-17 ENCOUNTER — Encounter: Payer: Self-pay | Admitting: Family Medicine

## 2020-07-17 VITALS — BP 140/80 | HR 60 | Temp 98.2°F | Resp 18 | Wt 200.4 lb

## 2020-07-17 DIAGNOSIS — I10 Essential (primary) hypertension: Secondary | ICD-10-CM | POA: Diagnosis not present

## 2020-07-17 DIAGNOSIS — E7800 Pure hypercholesterolemia, unspecified: Secondary | ICD-10-CM

## 2020-07-17 DIAGNOSIS — Z950 Presence of cardiac pacemaker: Secondary | ICD-10-CM

## 2020-07-17 DIAGNOSIS — F419 Anxiety disorder, unspecified: Secondary | ICD-10-CM | POA: Diagnosis not present

## 2020-07-17 NOTE — Patient Instructions (Signed)
Recommend getting a manual blood pressure cuff to monitor blood pressure at home. Recommend [employer] or [employer] brand.

## 2020-07-17 NOTE — Progress Notes (Signed)
I,Roshena L Chambers,acting as a scribe for Wilhemena Durie, MD.,have documented all relevant documentation on the behalf of Wilhemena Durie, MD,as directed by  Wilhemena Durie, MD while in the presence of Wilhemena Durie, MD.   Established patient visit   Patient: Justin Warren   DOB: 1945-08-03   75 y.o. Male  MRN: [id number] Visit Date: 07/17/2020  Today's healthcare provider: Wilhemena Durie, MD   Chief Complaint  Patient presents with  . Hypertension   Subjective    HPI  Patient comes in today for elevated blood pressure with a systolic of 016 when he was seen at urology.  He does not think it runs at high outside of office setting.  Does not have a home monitor.  He is asymptomatic. Hypertension, follow-up  BP Readings from Last 3 Encounters:  07/17/20 140/80  07/16/20 (!) 197/91  07/09/20 (!) 144/89   Wt Readings from Last 3 Encounters:  07/17/20 200 lb 6.4 oz (90.9 kg)  07/16/20 198 lb 8 oz (90 kg)  07/09/20 195 lb (88.5 kg)     Management since that visit includes; Metoprolol is listed on his medication list, but patient doesn't remember this medication being prescribed.  He reports poor compliance with treatment. He is not having side effects.  He is not exercising. He is not adherent to low salt diet.   Outside blood pressures are 197/91 at another providers office this past week. Home reading was 160/80  He does not smoke.  Use of agents associated with hypertension: none.   ---------------------------------------------------------------------------------------------------       Medications: Outpatient Medications Prior to Visit  Medication Sig  . acetaminophen (TYLENOL) 650 MG CR tablet Take 650 mg by mouth every 8 (eight) hours as needed for pain.  . busPIRone (BUSPAR) 15 MG tablet TAKE 1 TABLET TWICE DAILY  . donepezil (ARICEPT) 5 MG tablet Take 5 mg by mouth at bedtime.   Marland Kitchen FLUoxetine (PROZAC) 20 MG capsule Take 1 capsule (20  mg total) by mouth daily.  . meloxicam (MOBIC) 15 MG tablet Take 1 tablet (15 mg total) by mouth daily.  Marland Kitchen omeprazole (PRILOSEC) 40 MG capsule Take 1 capsule (40 mg total) by mouth daily.  . simvastatin (ZOCOR) 20 MG tablet Take 1 tablet (20 mg total) by mouth daily.  Manus Gunning BOWEL PREP KIT 17.5-3.13-1.6 GM/177ML SOLN SMARTSIG:354 Milliliter(s) By Mouth Once  . tadalafil (CIALIS) 5 MG tablet Take 1 tablet (5 mg total) by mouth daily as needed for erectile dysfunction.  . metoprolol succinate (TOPROL-XL) 25 MG 24 hr tablet Take 1 tablet by mouth daily. (Patient not taking: Reported on 07/17/2020)   No facility-administered medications prior to visit.    Review of Systems  Constitutional: Negative for appetite change, chills and fever.  Respiratory: Positive for shortness of breath. Negative for chest tightness and wheezing.   Cardiovascular: Negative for chest pain and palpitations.  Gastrointestinal: Negative for abdominal pain, nausea and vomiting.        Objective    BP 140/80 (BP Location: Left Arm, Patient Position: Sitting, Cuff Size: Normal)   Pulse 60   Temp 98.2 F (36.8 C) (Temporal)   Resp 18   Wt 200 lb 6.4 oz (90.9 kg)   BMI 29.59 kg/m  BP Readings from Last 3 Encounters:  07/17/20 140/80  07/16/20 (!) 197/91  07/09/20 (!) 144/89   Wt Readings from Last 3 Encounters:  07/17/20 200 lb 6.4 oz (90.9 kg)  07/16/20 198 lb 8 oz (90 kg)  07/09/20 195 lb (88.5 kg)       Physical Exam Vitals reviewed.  Constitutional:      General: He is not in acute distress.    Appearance: He is well-developed.  HENT:     Head: Normocephalic and atraumatic.     Right Ear: Hearing normal.     Left Ear: Hearing normal.     Nose: Nose normal.  Eyes:     General: Lids are normal. No scleral icterus.       Right eye: No discharge.        Left eye: No discharge.     Conjunctiva/sclera: Conjunctivae normal.  Cardiovascular:     Rate and Rhythm: Normal rate and regular rhythm.      Heart sounds: Normal heart sounds.  Pulmonary:     Effort: Pulmonary effort is normal. No respiratory distress.     Breath sounds: Normal breath sounds.  Abdominal:     General: Abdomen is flat. Bowel sounds are normal.     Palpations: Abdomen is soft.     Tenderness: There is no abdominal tenderness.  Musculoskeletal:        General: Normal range of motion.  Skin:    Findings: No lesion or rash.  Neurological:     General: No focal deficit present.     Mental Status: He is alert and oriented to person, place, and time.  Psychiatric:        Mood and Affect: Mood normal.        Speech: Speech normal.        Behavior: Behavior normal.        Thought Content: Thought content normal.        Judgment: Judgment normal.       No results found for any visits on 07/17/20.  Assessment & Plan     1. Hypertension, unspecified type Recommend monitoring blood pressure at home and follow up in 1 month with [employer], PA-C.  Possibly whitecoat hypertension.  Good blood pressure today.  Get a new cuff for home use and follow-up in 1 month with these readings. 2. Hypercholesteremia   3. Anxiety   4. Pacemaker Followed by cardiology.  Return in about 1 month (around 08/16/2020).      I, Wilhemena Durie, MD, have reviewed all documentation for this visit. The documentation on 07/24/20 for the exam, diagnosis, procedures, and orders are all accurate and complete.    Norva Bowe Cranford Mon, MD  [facility] [phone] (phone) [phone] (fax)  [place]

## 2020-07-23 ENCOUNTER — Ambulatory Visit: Payer: Medicare HMO | Admitting: Family Medicine

## 2020-07-24 ENCOUNTER — Ambulatory Visit: Payer: Medicare HMO | Admitting: Gastroenterology

## 2020-07-24 ENCOUNTER — Ambulatory Visit: Payer: Self-pay | Admitting: Urology

## 2020-08-01 ENCOUNTER — Other Ambulatory Visit: Payer: Self-pay | Admitting: Family Medicine

## 2020-08-01 DIAGNOSIS — F419 Anxiety disorder, unspecified: Secondary | ICD-10-CM

## 2020-08-06 ENCOUNTER — Other Ambulatory Visit: Payer: Self-pay | Admitting: Family Medicine

## 2020-08-06 DIAGNOSIS — F419 Anxiety disorder, unspecified: Secondary | ICD-10-CM

## 2020-08-15 ENCOUNTER — Other Ambulatory Visit: Payer: Self-pay | Admitting: Family Medicine

## 2020-08-15 ENCOUNTER — Ambulatory Visit: Payer: Self-pay | Admitting: Family Medicine

## 2020-08-15 ENCOUNTER — Telehealth: Payer: Self-pay

## 2020-08-15 DIAGNOSIS — R12 Heartburn: Secondary | ICD-10-CM

## 2020-08-15 MED ORDER — OMEPRAZOLE 20 MG PO CPDR: 20.0000 mg | DELAYED_RELEASE_CAPSULE | Freq: Every day | ORAL | 1 refills | Status: DC

## 2020-08-15 NOTE — Telephone Encounter (Signed)
Refill the Omeprazole 20 mg qd #90 & 1 RF. It was prescribed for heartburn/GERD. Schedule follow up appointment or GI referral if incontinence of bowels persists.

## 2020-08-15 NOTE — Telephone Encounter (Signed)
Copied from [place] [id number]. Topic: General - Other >> Aug 15, 2020 10:33 AM Celene Kras wrote: Reason for CRM: Pt called stating that he has had an upset stomach and that he made it as far as the elevators to the office when he made a mess on himself. Pt states that he was previously prescribed Prilosec by Tawanna Sat to help with holding his bowel movements, but that he ran out 3-4 days ago. Pt is requesting to see if this medication can be refilled since it was working for him. Please advise.   [id number] , [id number]: [phone]: [phone]: Not open 24 hours

## 2020-08-21 DIAGNOSIS — R413 Other amnesia: Secondary | ICD-10-CM | POA: Diagnosis not present

## 2020-08-21 DIAGNOSIS — R0789 Other chest pain: Secondary | ICD-10-CM | POA: Diagnosis not present

## 2020-08-21 DIAGNOSIS — I739 Peripheral vascular disease, unspecified: Secondary | ICD-10-CM | POA: Diagnosis not present

## 2020-08-21 DIAGNOSIS — I4589 Other specified conduction disorders: Secondary | ICD-10-CM | POA: Diagnosis not present

## 2020-08-21 DIAGNOSIS — R001 Bradycardia, unspecified: Secondary | ICD-10-CM | POA: Diagnosis not present

## 2020-08-21 DIAGNOSIS — I442 Atrioventricular block, complete: Secondary | ICD-10-CM | POA: Diagnosis not present

## 2020-08-21 DIAGNOSIS — M21371 Foot drop, right foot: Secondary | ICD-10-CM | POA: Diagnosis not present

## 2020-08-21 DIAGNOSIS — E785 Hyperlipidemia, unspecified: Secondary | ICD-10-CM | POA: Diagnosis not present

## 2020-08-21 DIAGNOSIS — R195 Other fecal abnormalities: Secondary | ICD-10-CM | POA: Diagnosis not present

## 2020-08-29 ENCOUNTER — Ambulatory Visit: Payer: Self-pay | Admitting: Family Medicine

## 2020-10-16 ENCOUNTER — Ambulatory Visit: Payer: Self-pay | Admitting: Family Medicine

## 2020-11-03 ENCOUNTER — Ambulatory Visit (INDEPENDENT_AMBULATORY_CARE_PROVIDER_SITE_OTHER): Payer: Medicare HMO | Admitting: Family Medicine

## 2020-11-03 ENCOUNTER — Other Ambulatory Visit: Payer: Self-pay

## 2020-11-03 ENCOUNTER — Encounter: Payer: Self-pay | Admitting: Family Medicine

## 2020-11-03 VITALS — BP 166/82 | HR 60 | Temp 97.9°F | Ht 69.0 in | Wt 204.8 lb

## 2020-11-03 DIAGNOSIS — M21372 Foot drop, left foot: Secondary | ICD-10-CM

## 2020-11-03 DIAGNOSIS — I10 Essential (primary) hypertension: Secondary | ICD-10-CM

## 2020-11-03 DIAGNOSIS — R413 Other amnesia: Secondary | ICD-10-CM

## 2020-11-03 DIAGNOSIS — M21371 Foot drop, right foot: Secondary | ICD-10-CM | POA: Diagnosis not present

## 2020-11-03 DIAGNOSIS — E7800 Pure hypercholesterolemia, unspecified: Secondary | ICD-10-CM

## 2020-11-03 MED ORDER — DONEPEZIL HCL 5 MG PO TABS
5.0000 mg | ORAL_TABLET | Freq: Every day | ORAL | 3 refills | Status: DC
Start: 2020-11-03 — End: 2021-04-06

## 2020-11-03 NOTE — Progress Notes (Signed)
Established patient visit   Patient: Justin Warren   DOB: 1946/02/19   75 y.o. Male  MRN: [id number] Visit Date: 11/03/2020  Today's healthcare provider: Vernie Murders, PA-C   No chief complaint on file.  Subjective    -Hypercholesteremia 6 month f/u  -Has been taking Toprol-XL at 50 mg qd since 08/21/20.  -Having shortness of breath. Been dealing with for the last 6 months give or take. No associated symptoms. Wondering if its just due to him not being as active and possibly getting out of shape.  -Reports to be sleeping well. Reports to be eating okay. Does not eat regularly typically eating one full meal a day with minor snacks throughout the day. Currently no exercise.  Was checking BP regularly has slowed down with checking it.  Patient Active Problem List   Diagnosis Date Noted   Positive colorectal cancer screening using Cologuard test 06/04/2020   Calculus of gallbladder without cholecystitis without obstruction 05/27/2020   Pallor of extremity 01/15/2020   Numbness and tingling of both legs below knees 06/10/2019   Loss of memory 06/10/2019   Difficulty walking 06/10/2019   Ataxia 06/10/2019   PAD (peripheral artery disease) ([place]) 05/25/2019   History of total knee arthroplasty 12/07/2018   CHB (complete heart block) ([place]) 04/20/2018   Atrioventricular (AV) dissociation 04/06/2018   Bradycardia 03/09/2018   Atypical chest pain 03/09/2018   Foot drop, bilateral 05/02/2017   Lumbar back pain with radiculopathy affecting right lower extremity 04/09/2016   Hx of decompressive lumbar laminectomy 04/09/2016   Anxiety 03/07/2015   Clinical depression 03/07/2015   Difficulty hearing 03/07/2015   Hypercholesteremia 03/07/2015   Arthritis, degenerative 03/07/2015   Malignant neoplasm of skin 03/07/2015   Chronic GERD 06/26/2009   External hemorrhoids without complication 70/17/7939   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   Past Medical History:  Diagnosis Date    Anxiety    Bradycardia    GERD (gastroesophageal reflux disease)    History of kidney stones    Hyperlipidemia    Hypertension    Kidney stone    Neuropathy    Past Surgical History:  Procedure Laterality Date   APPENDECTOMY  1994   BACK SURGERY     CARPAL TUNNEL RELEASE Right 2012   CARPAL TUNNEL RELEASE Left 02/06/2020   Procedure: CARPAL TUNNEL RELEASE;  Surgeon: Earnestine Leys, MD;  Location: [facility] ORS;  Service: Orthopedics;  Laterality: Left;  BLOCK   CATARACT EXTRACTION, BILATERAL  2019   COLONOSCOPY WITH PROPOFOL N/A 07/09/2020   Procedure: COLONOSCOPY WITH PROPOFOL;  Surgeon: Lin Landsman, MD;  Location: [facility] ENDOSCOPY;  Service: Gastroenterology;  Laterality: N/A;   CYSTOSCOPY W/ RETROGRADES Right 11/30/2019   Procedure: CYSTOSCOPY WITH RETROGRADE PYELOGRAM;  Surgeon: [name], MD;  Location: [facility] ORS;  Service: Urology;  Laterality: Right;   CYSTOSCOPY/URETEROSCOPY/HOLMIUM LASER/STENT PLACEMENT Right 11/30/2019   Procedure: CYSTOSCOPY/URETEROSCOPY/HOLMIUM LASER/STENT PLACEMENT;  Surgeon: [name], MD;  Location: [facility] ORS;  Service: Urology;  Laterality: Right;   ESOPHAGOGASTRODUODENOSCOPY (EGD) WITH PROPOFOL N/A 07/09/2020   Procedure: ESOPHAGOGASTRODUODENOSCOPY (EGD) WITH PROPOFOL;  Surgeon: Lin Landsman, MD;  Location: [facility] ENDOSCOPY;  Service: Gastroenterology;  Laterality: N/A;   EYE SURGERY     INSERT / REPLACE / REMOVE PACEMAKER     JOINT REPLACEMENT     LUMBAR LAMINECTOMIES  1976 & 1978   Dr. [name]   PACEMAKER INSERTION Left 04/20/2018   Procedure: INSERTION PACEMAKER-DUAL CHAMBER;  Surgeon: [name], MD;  Location: [facility]  ORS;  Service: Cardiovascular;  Laterality: Left;   REPLACEMENT TOTAL KNEE Right 02/2019   SIGMOIDOSCOPY  2010   TRIGGER FINGER RELEASE Left 02/06/2020   Procedure: RELEASE TRIGGER FINGER/A-1 PULLEY;  Surgeon: Earnestine Leys, MD;  Location: [facility] ORS;  Service: Orthopedics;  Laterality: Left;   Family History   Problem Relation Age of Onset   Dementia Mother    GER disease Mother    Hypertension Father    Arrhythmia Sister    Heart murmur Sister    Social History   Tobacco Use   Smoking status: Former    Packs/day: 2.00    Years: 30.00    Pack years: 60.00    Types: Cigarettes   Smokeless tobacco: Never   Tobacco comments:    quit in 1991-1992  Vaping Use   Vaping Use: Never used  Substance Use Topics   Alcohol use: Not Currently    Alcohol/week: 0.0 standard drinks    Comment: quit over 30 years ago   Drug use: No    Allergies  Allergen Reactions   Amoxicillin-Pot Clavulanate Nausea And Vomiting    DID THE REACTION INVOLVE: Swelling of the face/tongue/throat, SOB, or low BP? No Sudden or severe rash/hives, skin peeling, or the inside of the mouth or nose? No Did it require medical treatment? No When did it last happen? Over 10 years ago If all above answers are "NO", may proceed with cephalosporin use.       Medications: Outpatient Medications Prior to Visit  Medication Sig   acetaminophen (TYLENOL) 650 MG CR tablet Take 650 mg by mouth every 8 (eight) hours as needed for pain.   busPIRone (BUSPAR) 15 MG tablet TAKE 1 TABLET TWICE DAILY   donepezil (ARICEPT) 5 MG tablet Take 5 mg by mouth at bedtime.    FLUoxetine (PROZAC) 20 MG capsule TAKE 1 CAPSULE EVERY DAY   meloxicam (MOBIC) 15 MG tablet Take 1 tablet (15 mg total) by mouth daily.   metoprolol succinate (TOPROL-XL) 25 MG 24 hr tablet Take 1 tablet by mouth daily. (Patient not taking: Reported on 07/17/2020)   omeprazole (PRILOSEC) 20 MG capsule Take 1 capsule (20 mg total) by mouth daily.   simvastatin (ZOCOR) 20 MG tablet Take 1 tablet (20 mg total) by mouth daily.   SUPREP BOWEL PREP KIT 17.5-3.13-1.6 GM/177ML SOLN SMARTSIG:354 Milliliter(s) By Mouth Once   tadalafil (CIALIS) 5 MG tablet Take 1 tablet (5 mg total) by mouth daily as needed for erectile dysfunction.   No facility-administered medications prior to  visit.    Review of Systems  Constitutional:  Positive for fatigue.  HENT: Negative.    Respiratory:  Positive for shortness of breath.        With exertion.  Cardiovascular: Negative.   Neurological:        Foot drop with poor balance and memory diminished.   Last CBC Lab Results  Component Value Date   WBC 5.5 04/16/2020   HGB 16.1 04/16/2020   HCT 47.1 04/16/2020   MCV 82 04/16/2020   MCH 28.1 04/16/2020   RDW 13.2 04/16/2020   PLT 232 84/69/6295   Last metabolic panel Lab Results  Component Value Date   GLUCOSE 107 (H) 04/16/2020   NA 142 04/16/2020   K 4.4 04/16/2020   CL 104 04/16/2020   CO2 24 04/16/2020   BUN 13 04/16/2020   CREATININE 1.01 04/16/2020   GFRNONAA 71 04/16/2020   GFRAA 83 04/16/2020   CALCIUM 10.0 04/16/2020   PROT  6.6 04/16/2020   ALBUMIN 4.4 04/16/2020   LABGLOB 2.2 04/16/2020   AGRATIO 2.0 04/16/2020   BILITOT 0.7 04/16/2020   ALKPHOS 120 04/16/2020   AST 24 04/16/2020   ALT 25 04/16/2020   ANIONGAP 11 11/24/2019   Last lipids Lab Results  Component Value Date   CHOL 171 04/16/2020   HDL 39 (L) 04/16/2020   LDLCALC 104 (H) 04/16/2020   TRIG 159 (H) 04/16/2020   CHOLHDL 4.4 04/16/2020   Last hemoglobin A1c Lab Results  Component Value Date   HGBA1C 5.1 11/23/2018   Last thyroid functions Lab Results  Component Value Date   TSH 2.000 04/16/2020       Objective    BP (!) 166/82 (BP Location: Right Arm, Patient Position: Sitting, Cuff Size: Normal)   Pulse 60   Temp 97.9 F (36.6 C) (Oral)   Ht _0  (1.753 m)   Wt 204 lb 12.8 oz (92.9 kg)   SpO2 96%   BMI 30.24 kg/m  BP Readings from Last 3 Encounters:  07/17/20 140/80  07/16/20 (!) 197/91  07/09/20 (!) 144/89   Wt Readings from Last 3 Encounters:  07/17/20 200 lb 6.4 oz (90.9 kg)  07/16/20 198 lb 8 oz (90 kg)  07/09/20 195 lb (88.5 kg)   Physical Exam Constitutional:      General: He is not in acute distress.    Appearance: He is well-developed.  HENT:      Head: Normocephalic and atraumatic.     Right Ear: Hearing normal.     Left Ear: Hearing normal.     Nose: Nose normal.  Eyes:     General: Lids are normal. No scleral icterus.       Right eye: No discharge.        Left eye: No discharge.     Conjunctiva/sclera: Conjunctivae normal.  Cardiovascular:     Rate and Rhythm: Normal rate and regular rhythm.     Heart sounds: Normal heart sounds.  Pulmonary:     Effort: Pulmonary effort is normal. No respiratory distress.     Breath sounds: Normal breath sounds.  Abdominal:     General: Bowel sounds are normal.     Palpations: Abdomen is soft.  Musculoskeletal:        General: Normal range of motion.     Cervical back: Neck supple.  Skin:    Findings: No lesion or rash.  Neurological:     Mental Status: He is alert and oriented to person, place, and time.     Gait: Gait abnormal.     Comments: Bilateral posterior braces for foot drop.  Psychiatric:        Speech: Speech normal.        Behavior: Behavior normal.        Thought Content: Thought content normal.      No results found for any visits on 11/03/20.  Assessment & Plan     1. Hypercholesteremia Tolerating Simvastatin 20 mg qd without side effects. Recheck routine labs and continue low fat diet. - Lipid panel - Comprehensive metabolic panel - TSH - CBC with Differential/Platelet  2. Hypertension, unspecified type Presently on Toprol-XL 50 mg qd per Dr. Ubaldo Glassing (cardiologist). Get follow up labs. - Lipid panel - Comprehensive metabolic panel - TSH - CBC with Differential/Platelet  3. Loss of memory Ran out of Aricept. Continues follow up with Dr [name]. - Comprehensive metabolic panel - TSH - CBC with Differential/Platelet - donepezil (ARICEPT) 5 MG  tablet; Take 1 tablet (5 mg total) by mouth at bedtime.  Dispense: 90 tablet; Refill: 3  4. Bilateral foot-drop Balance poor but posterior braces on each lower leg helps. Using a can for walking and a scooter  for walking distances.   No follow-ups on file.      I, Reha Martinovich, PA-C, have reviewed all documentation for this visit. The documentation on 11/03/20 for the exam, diagnosis, procedures, and orders are all accurate and complete.    Vernie Murders, PA-C  [employer] [phone] (phone) [phone] (fax)  [place]

## 2020-11-04 DIAGNOSIS — I442 Atrioventricular block, complete: Secondary | ICD-10-CM | POA: Diagnosis not present

## 2020-11-04 LAB — COMPREHENSIVE METABOLIC PANEL
ALT: 22 IU/L (ref 0–44)
AST: 23 IU/L (ref 0–40)
Albumin/Globulin Ratio: 1.9 (ref 1.2–2.2)
Albumin: 4.2 g/dL (ref 3.7–4.7)
Alkaline Phosphatase: 136 IU/L — ABNORMAL HIGH (ref 44–121)
BUN/Creatinine Ratio: 11 (ref 10–24)
BUN: 11 mg/dL (ref 8–27)
Bilirubin Total: 0.8 mg/dL (ref 0.0–1.2)
CO2: 25 mmol/L (ref 20–29)
Calcium: 9.9 mg/dL (ref 8.6–10.2)
Chloride: 103 mmol/L (ref 96–106)
Creatinine, Ser: 1.03 mg/dL (ref 0.76–1.27)
Globulin, Total: 2.2 g/dL (ref 1.5–4.5)
Glucose: 95 mg/dL (ref 65–99)
Potassium: 4.8 mmol/L (ref 3.5–5.2)
Sodium: 143 mmol/L (ref 134–144)
Total Protein: 6.4 g/dL (ref 6.0–8.5)
eGFR: 75 mL/min/{1.73_m2} (ref >59)

## 2020-11-04 LAB — CBC WITH DIFFERENTIAL/PLATELET
Basophils Absolute: 0.1 10*3/uL (ref 0.0–0.2)
Basos: 2 %
EOS (ABSOLUTE): 0.2 10*3/uL (ref 0.0–0.4)
Eos: 3 %
Hematocrit: 44.1 % (ref 37.5–51.0)
Hemoglobin: 14.7 g/dL (ref 13.0–17.7)
Immature Grans (Abs): 0 10*3/uL (ref 0.0–0.1)
Immature Granulocytes: 0 %
Lymphocytes Absolute: 1.5 10*3/uL (ref 0.7–3.1)
Lymphs: 26 %
MCH: 27.9 pg (ref 26.6–33.0)
MCHC: 33.3 g/dL (ref 31.5–35.7)
MCV: 84 fL (ref 79–97)
Monocytes Absolute: 0.5 10*3/uL (ref 0.1–0.9)
Monocytes: 9 %
Neutrophils Absolute: 3.4 10*3/uL (ref 1.4–7.0)
Neutrophils: 60 %
Platelets: 183 10*3/uL (ref 150–450)
RBC: 5.27 x10E6/uL (ref 4.14–5.80)
RDW: 13.9 % (ref 11.6–15.4)
WBC: 5.7 10*3/uL (ref 3.4–10.8)

## 2020-11-04 LAB — LIPID PANEL
Chol/HDL Ratio: 4.7 ratio (ref 0.0–5.0)
Cholesterol, Total: 163 mg/dL (ref 100–199)
HDL: 35 mg/dL — ABNORMAL LOW (ref >39)
LDL Chol Calc (NIH): 99 mg/dL (ref 0–99)
Triglycerides: 167 mg/dL — ABNORMAL HIGH (ref 0–149)
VLDL Cholesterol Cal: 29 mg/dL (ref 5–40)

## 2020-11-04 LAB — TSH: TSH: 3.04 u[IU]/mL (ref 0.450–4.500)

## 2020-11-10 ENCOUNTER — Other Ambulatory Visit: Payer: Self-pay

## 2020-11-10 MED ORDER — SIMVASTATIN 40 MG PO TABS: 40.0000 mg | ORAL_TABLET | Freq: Every day | ORAL | 1 refills | Status: DC

## 2020-11-18 ENCOUNTER — Ambulatory Visit: Payer: Self-pay | Admitting: *Deleted

## 2020-11-18 NOTE — Telephone Encounter (Signed)
Reason for Disposition  Caller has medicine question only, adult not sick, AND triager answers question  Answer Assessment - Initial Assessment Questions 1. NAME of MEDICATION: "What medicine are you calling about?"     Omeprazole 20 mg I'm taking but I received 40 mg.   Which should I take? 2. QUESTION: "What is your question?" (e.g., double dose of medicine, side effect)     Which dose should I be taking?   3. PRESCRIBING HCP: "Who prescribed it?" Reason: if prescribed by specialist, call should be referred to that group.     Dennis Chrismon, PA-C 4. SYMPTOMS: "Do you have any symptoms?"     N/A 5. SEVERITY: If symptoms are present, ask "Are they mild, moderate or severe?"     N/A 6. PREGNANCY:  "Is there any chance that you are pregnant?" "When was your last menstrual period?"     N/A  Protocols used: Medication Question Call-A-AH

## 2020-11-18 NOTE — Telephone Encounter (Signed)
I returned pt's call.   He had called in needing clarification on the omeprazole dose.  He thought the omeprazole had been increased to 40 mg from 20 mg.   Checking his chart Simona Huh Chrismon, PA-C increased his simvastatin from 20 mg to 40 mg because his triglycerides were elevated on his blood work from his OV on 11/03/2020.  I had him repeat back to me the correct dosages:  Omeprazole 20 mg and the simvastatin was 40 mg now.   He also asked me about Dr. Ubaldo Glassing (heart dr) increasing his metoprolol from 25 mg to 50 mg.    I looked on his medication record and the metoprolol had been increased from 25 mg to 50 mg due to his BP being elevated.   He said he would call Dr. Bethanne Ginger office and just be sure he was taking the metoprolol correctly.   I let him know that was a good idea.    "All these pills and stuff can be so confusing".  I gave him reassurance and let him know if he had further questions we would be glad to help him to just give [place] a call back.   He thanked me for my help.

## 2020-11-27 DIAGNOSIS — I442 Atrioventricular block, complete: Secondary | ICD-10-CM | POA: Diagnosis not present

## 2020-11-27 DIAGNOSIS — R001 Bradycardia, unspecified: Secondary | ICD-10-CM | POA: Diagnosis not present

## 2020-11-27 DIAGNOSIS — I739 Peripheral vascular disease, unspecified: Secondary | ICD-10-CM | POA: Diagnosis not present

## 2020-11-27 DIAGNOSIS — I4589 Other specified conduction disorders: Secondary | ICD-10-CM | POA: Diagnosis not present

## 2020-11-27 DIAGNOSIS — M21372 Foot drop, left foot: Secondary | ICD-10-CM | POA: Diagnosis not present

## 2020-11-27 DIAGNOSIS — R0789 Other chest pain: Secondary | ICD-10-CM | POA: Diagnosis not present

## 2020-11-27 DIAGNOSIS — E785 Hyperlipidemia, unspecified: Secondary | ICD-10-CM | POA: Diagnosis not present

## 2020-11-27 DIAGNOSIS — M21371 Foot drop, right foot: Secondary | ICD-10-CM | POA: Diagnosis not present

## 2020-12-03 ENCOUNTER — Other Ambulatory Visit: Payer: Self-pay | Admitting: Physician Assistant

## 2020-12-03 ENCOUNTER — Other Ambulatory Visit: Payer: Self-pay | Admitting: Family Medicine

## 2020-12-03 DIAGNOSIS — R12 Heartburn: Secondary | ICD-10-CM

## 2020-12-03 DIAGNOSIS — F419 Anxiety disorder, unspecified: Secondary | ICD-10-CM

## 2021-01-06 ENCOUNTER — Telehealth: Payer: Self-pay | Admitting: Family Medicine

## 2021-01-06 NOTE — Telephone Encounter (Signed)
Medication Refill - Medication: gabapentin (NEURONTIN) capsule 300 mg  Pharmacy sent a request for this last week by way of fax   Has the patient contacted their pharmacy? Yes.   (Agent: If no, request that the patient contact the pharmacy for the refill.) (Agent: If yes, when and what did the pharmacy advise?)pharmacy was the caller   Preferred Pharmacy (with phone number or street name): [facility], [address]  [place], [place] [address]  Phone:  [phone]  Fax:  [phone]  Has the patient been seen for an appointment in the last year OR does the patient have an upcoming appointment? Yes.   8.8.22 last OV  Agent: Please be advised that RX refills may take up to 3 business days. We ask that you follow-up with your pharmacy.

## 2021-01-15 DIAGNOSIS — L570 Actinic keratosis: Secondary | ICD-10-CM | POA: Diagnosis not present

## 2021-01-15 DIAGNOSIS — D2262 Melanocytic nevi of left upper limb, including shoulder: Secondary | ICD-10-CM | POA: Diagnosis not present

## 2021-01-15 DIAGNOSIS — D485 Neoplasm of uncertain behavior of skin: Secondary | ICD-10-CM | POA: Diagnosis not present

## 2021-01-15 DIAGNOSIS — D044 Carcinoma in situ of skin of scalp and neck: Secondary | ICD-10-CM | POA: Diagnosis not present

## 2021-01-15 DIAGNOSIS — D2271 Melanocytic nevi of right lower limb, including hip: Secondary | ICD-10-CM | POA: Diagnosis not present

## 2021-01-15 DIAGNOSIS — X32XXXA Exposure to sunlight, initial encounter: Secondary | ICD-10-CM | POA: Diagnosis not present

## 2021-01-15 DIAGNOSIS — D2261 Melanocytic nevi of right upper limb, including shoulder: Secondary | ICD-10-CM | POA: Diagnosis not present

## 2021-01-15 DIAGNOSIS — Z85828 Personal history of other malignant neoplasm of skin: Secondary | ICD-10-CM | POA: Diagnosis not present

## 2021-02-09 ENCOUNTER — Telehealth: Payer: Self-pay | Admitting: Family Medicine

## 2021-02-09 NOTE — Telephone Encounter (Signed)
[facility] faxed refill request for the following medications:   omeprazole (PRILOSEC) 20 MG capsule  Please advise.

## 2021-02-09 NOTE — Telephone Encounter (Signed)
Denied. Per note from 11/18/2020. Omeprazole 20 mg was increased to 40 mg qd.

## 2021-02-20 DIAGNOSIS — Z20822 Contact with and (suspected) exposure to covid-19: Secondary | ICD-10-CM | POA: Diagnosis not present

## 2021-02-20 DIAGNOSIS — J209 Acute bronchitis, unspecified: Secondary | ICD-10-CM | POA: Diagnosis not present

## 2021-02-20 DIAGNOSIS — R059 Cough, unspecified: Secondary | ICD-10-CM | POA: Diagnosis not present

## 2021-02-23 ENCOUNTER — Other Ambulatory Visit: Payer: Self-pay | Admitting: Family Medicine

## 2021-02-23 DIAGNOSIS — F419 Anxiety disorder, unspecified: Secondary | ICD-10-CM

## 2021-02-23 MED ORDER — FLUOXETINE HCL 20 MG PO CAPS: 20.0000 mg | ORAL_CAPSULE | Freq: Every day | ORAL | 1 refills | Status: DC

## 2021-02-23 NOTE — Telephone Encounter (Signed)
[facility] faxed refill request for the following medications:  FLUoxetine (PROZAC) 20 MG capsule   Please advise.

## 2021-02-26 ENCOUNTER — Emergency Department: Payer: Medicare HMO

## 2021-02-26 ENCOUNTER — Encounter: Payer: Self-pay | Admitting: Internal Medicine

## 2021-02-26 ENCOUNTER — Ambulatory Visit: Payer: Self-pay | Admitting: *Deleted

## 2021-02-26 ENCOUNTER — Inpatient Hospital Stay
Admission: EM | Admit: 2021-02-26 | Discharge: 2021-02-28 | DRG: 247 | Disposition: A | Discharge status: Home / Self Care | Payer: Medicare HMO | Attending: Internal Medicine | Admitting: Internal Medicine

## 2021-02-26 ENCOUNTER — Other Ambulatory Visit: Payer: Self-pay

## 2021-02-26 DIAGNOSIS — F419 Anxiety disorder, unspecified: Secondary | ICD-10-CM | POA: Diagnosis present

## 2021-02-26 DIAGNOSIS — Z20822 Contact with and (suspected) exposure to covid-19: Secondary | ICD-10-CM | POA: Diagnosis present

## 2021-02-26 DIAGNOSIS — E7800 Pure hypercholesterolemia, unspecified: Secondary | ICD-10-CM | POA: Diagnosis present

## 2021-02-26 DIAGNOSIS — R197 Diarrhea, unspecified: Secondary | ICD-10-CM | POA: Diagnosis present

## 2021-02-26 DIAGNOSIS — R413 Other amnesia: Secondary | ICD-10-CM | POA: Diagnosis present

## 2021-02-26 DIAGNOSIS — Z791 Long term (current) use of non-steroidal anti-inflammatories (NSAID): Secondary | ICD-10-CM

## 2021-02-26 DIAGNOSIS — E669 Obesity, unspecified: Secondary | ICD-10-CM | POA: Diagnosis present

## 2021-02-26 DIAGNOSIS — R0789 Other chest pain: Secondary | ICD-10-CM | POA: Diagnosis not present

## 2021-02-26 DIAGNOSIS — K625 Hemorrhage of anus and rectum: Secondary | ICD-10-CM | POA: Diagnosis present

## 2021-02-26 DIAGNOSIS — Z9049 Acquired absence of other specified parts of digestive tract: Secondary | ICD-10-CM

## 2021-02-26 DIAGNOSIS — I442 Atrioventricular block, complete: Secondary | ICD-10-CM | POA: Diagnosis present

## 2021-02-26 DIAGNOSIS — Z96651 Presence of right artificial knee joint: Secondary | ICD-10-CM | POA: Diagnosis present

## 2021-02-26 DIAGNOSIS — I739 Peripheral vascular disease, unspecified: Secondary | ICD-10-CM | POA: Diagnosis present

## 2021-02-26 DIAGNOSIS — Z79899 Other long term (current) drug therapy: Secondary | ICD-10-CM | POA: Diagnosis not present

## 2021-02-26 DIAGNOSIS — M21372 Foot drop, left foot: Secondary | ICD-10-CM | POA: Diagnosis present

## 2021-02-26 DIAGNOSIS — R001 Bradycardia, unspecified: Secondary | ICD-10-CM | POA: Diagnosis present

## 2021-02-26 DIAGNOSIS — Z87442 Personal history of urinary calculi: Secondary | ICD-10-CM

## 2021-02-26 DIAGNOSIS — R079 Chest pain, unspecified: Secondary | ICD-10-CM | POA: Diagnosis not present

## 2021-02-26 DIAGNOSIS — I2510 Atherosclerotic heart disease of native coronary artery without angina pectoris: Secondary | ICD-10-CM | POA: Diagnosis present

## 2021-02-26 DIAGNOSIS — K219 Gastro-esophageal reflux disease without esophagitis: Secondary | ICD-10-CM | POA: Diagnosis present

## 2021-02-26 DIAGNOSIS — F32A Depression, unspecified: Secondary | ICD-10-CM | POA: Diagnosis present

## 2021-02-26 DIAGNOSIS — M21371 Foot drop, right foot: Secondary | ICD-10-CM | POA: Diagnosis present

## 2021-02-26 DIAGNOSIS — Z950 Presence of cardiac pacemaker: Secondary | ICD-10-CM | POA: Diagnosis not present

## 2021-02-26 DIAGNOSIS — I119 Hypertensive heart disease without heart failure: Secondary | ICD-10-CM | POA: Diagnosis present

## 2021-02-26 DIAGNOSIS — Z8249 Family history of ischemic heart disease and other diseases of the circulatory system: Secondary | ICD-10-CM | POA: Diagnosis not present

## 2021-02-26 DIAGNOSIS — Z87891 Personal history of nicotine dependence: Secondary | ICD-10-CM | POA: Diagnosis not present

## 2021-02-26 DIAGNOSIS — I517 Cardiomegaly: Secondary | ICD-10-CM | POA: Diagnosis not present

## 2021-02-26 DIAGNOSIS — Z9861 Coronary angioplasty status: Secondary | ICD-10-CM | POA: Diagnosis not present

## 2021-02-26 DIAGNOSIS — Z880 Allergy status to penicillin: Secondary | ICD-10-CM

## 2021-02-26 DIAGNOSIS — Z6828 Body mass index (BMI) 28.0-28.9, adult: Secondary | ICD-10-CM | POA: Diagnosis not present

## 2021-02-26 DIAGNOSIS — I214 Non-ST elevation (NSTEMI) myocardial infarction: Principal | ICD-10-CM | POA: Diagnosis present

## 2021-02-26 DIAGNOSIS — I10 Essential (primary) hypertension: Secondary | ICD-10-CM | POA: Diagnosis not present

## 2021-02-26 LAB — BASIC METABOLIC PANEL
Anion gap: 7 (ref 5–15)
BUN: 16 mg/dL (ref 8–23)
CO2: 24 mmol/L (ref 22–32)
Calcium: 9 mg/dL (ref 8.9–10.3)
Chloride: 101 mmol/L (ref 98–111)
Creatinine, Ser: 0.72 mg/dL (ref 0.61–1.24)
GFR, Estimated: >60 mL/min (ref >60)
Glucose, Bld: 114 mg/dL — ABNORMAL HIGH (ref 70–99)
Potassium: 3.4 mmol/L — ABNORMAL LOW (ref 3.5–5.1)
Sodium: 132 mmol/L — ABNORMAL LOW (ref 135–145)

## 2021-02-26 LAB — APTT: aPTT: 29 seconds (ref 24–36)

## 2021-02-26 LAB — CBC
HCT: 44.7 % (ref 39.0–52.0)
Hemoglobin: 15.4 g/dL (ref 13.0–17.0)
MCH: 28.5 pg (ref 26.0–34.0)
MCHC: 34.5 g/dL (ref 30.0–36.0)
MCV: 82.8 fL (ref 80.0–100.0)
Platelets: 206 10*3/uL (ref 150–400)
RBC: 5.4 MIL/uL (ref 4.22–5.81)
RDW: 13.3 % (ref 11.5–15.5)
WBC: 10.6 10*3/uL — ABNORMAL HIGH (ref 4.0–10.5)
nRBC: 0 % (ref 0.0–0.2)

## 2021-02-26 LAB — BRAIN NATRIURETIC PEPTIDE: B Natriuretic Peptide: 212.7 pg/mL — ABNORMAL HIGH (ref 0.0–100.0)

## 2021-02-26 LAB — TROPONIN I (HIGH SENSITIVITY)
Troponin I (High Sensitivity): 1839 ng/L (ref <18)
Troponin I (High Sensitivity): 1758 ng/L (ref <18)

## 2021-02-26 LAB — RESP PANEL BY RT-PCR (FLU A&B, COVID) ARPGX2
Influenza A by PCR: NEGATIVE
Influenza B by PCR: NEGATIVE
SARS Coronavirus 2 by RT PCR: NEGATIVE

## 2021-02-26 MED ORDER — ASPIRIN 81 MG PO CHEW
324.0000 mg | CHEWABLE_TABLET | Freq: Once | ORAL | Status: AC
  Administered 2021-02-26: 324 mg via ORAL
  Filled 2021-02-26: qty 4

## 2021-02-26 MED ORDER — HEPARIN (PORCINE) 25000 UT/250ML-% IV SOLN
1100.0000 [IU]/h | INTRAVENOUS | Status: DC
  Administered 2021-02-26: 1100 [IU]/h via INTRAVENOUS
  Filled 2021-02-26: qty 250

## 2021-02-26 MED ORDER — ONDANSETRON HCL 4 MG/2ML IJ SOLN: 4.0000 mg | Freq: Four times a day (QID) | INTRAMUSCULAR | Status: DC | PRN

## 2021-02-26 MED ORDER — SODIUM CHLORIDE 0.9% FLUSH: 3.0000 mL | Freq: Two times a day (BID) | INTRAVENOUS | Status: DC

## 2021-02-26 MED ORDER — ACETAMINOPHEN 650 MG RE SUPP
650.0000 mg | Freq: Four times a day (QID) | RECTAL | Status: DC | PRN
  Filled 2021-02-26: qty 1

## 2021-02-26 MED ORDER — ONDANSETRON HCL 4 MG PO TABS: 4.0000 mg | ORAL_TABLET | Freq: Four times a day (QID) | ORAL | Status: DC | PRN

## 2021-02-26 MED ORDER — ACETAMINOPHEN 325 MG PO TABS: 650.0000 mg | ORAL_TABLET | Freq: Four times a day (QID) | ORAL | Status: DC | PRN

## 2021-02-26 MED ORDER — SIMVASTATIN 20 MG PO TABS
40.0000 mg | ORAL_TABLET | Freq: Every day | ORAL | Status: DC
  Administered 2021-02-27: 40 mg via ORAL
  Filled 2021-02-26: qty 2
  Filled 2021-02-26: qty 4

## 2021-02-26 MED ORDER — BUSPIRONE HCL 15 MG PO TABS
15.0000 mg | ORAL_TABLET | Freq: Two times a day (BID) | ORAL | Status: DC
  Administered 2021-02-27 – 2021-02-28 (×3): 15 mg via ORAL
  Filled 2021-02-26: qty 1
  Filled 2021-02-26: qty 3
  Filled 2021-02-26 (×3): qty 1

## 2021-02-26 MED ORDER — NITROGLYCERIN 0.4 MG SL SUBL
0.4000 mg | SUBLINGUAL_TABLET | SUBLINGUAL | Status: DC | PRN
  Administered 2021-02-26: 0.4 mg via SUBLINGUAL
  Filled 2021-02-26: qty 1

## 2021-02-26 MED ORDER — HEPARIN (PORCINE) 25000 UT/250ML-% IV SOLN: 12.0000 [IU]/kg/h | INTRAVENOUS | Status: DC

## 2021-02-26 MED ORDER — PANTOPRAZOLE SODIUM 40 MG PO TBEC
80.0000 mg | DELAYED_RELEASE_TABLET | Freq: Every day | ORAL | Status: DC
  Administered 2021-02-27: 80 mg via ORAL
  Filled 2021-02-26: qty 2

## 2021-02-26 MED ORDER — MELATONIN 5 MG PO TABS: 5.0000 mg | ORAL_TABLET | Freq: Every evening | ORAL | Status: DC | PRN

## 2021-02-26 MED ORDER — FLUOXETINE HCL 20 MG PO CAPS
20.0000 mg | ORAL_CAPSULE | Freq: Every day | ORAL | Status: DC
  Administered 2021-02-27 – 2021-02-28 (×2): 20 mg via ORAL
  Filled 2021-02-26 (×2): qty 1

## 2021-02-26 MED ORDER — METOPROLOL SUCCINATE ER 25 MG PO TB24
25.0000 mg | ORAL_TABLET | Freq: Every day | ORAL | Status: DC
  Administered 2021-02-27 – 2021-02-28 (×2): 25 mg via ORAL
  Filled 2021-02-26 (×2): qty 1

## 2021-02-26 MED ORDER — HEPARIN SODIUM (PORCINE) 5000 UNIT/ML IJ SOLN
4000.0000 [IU] | Freq: Once | INTRAMUSCULAR | Status: AC
  Administered 2021-02-26: 4000 [IU] via INTRAVENOUS
  Filled 2021-02-26: qty 1

## 2021-02-26 MED ORDER — DONEPEZIL HCL 5 MG PO TABS
5.0000 mg | ORAL_TABLET | Freq: Every day | ORAL | Status: DC
  Administered 2021-02-27 (×2): 5 mg via ORAL
  Filled 2021-02-26 (×3): qty 1

## 2021-02-26 MED ORDER — BENZONATATE 100 MG PO CAPS: 100.0000 mg | ORAL_CAPSULE | Freq: Three times a day (TID) | ORAL | Status: DC | PRN

## 2021-02-26 MED ORDER — ENALAPRILAT 1.25 MG/ML IV SOLN: 0.6250 mg | Freq: Once | INTRAVENOUS | Status: DC

## 2021-02-26 NOTE — ED Provider Notes (Signed)
[facility]  ____________________________________________   Event Date/Time   First MD Initiated Contact with Patient 02/26/21 1447     (approximate)  I have reviewed the triage vital signs and the nursing notes.   HISTORY  Chief Complaint Chest Pain    HPI Justin Warren is a 75 y.o. male past medical history of complete heart block status post pacemaker,, hypertension, hyperlipidemia who presents with chest pain.  Symptoms started about 2 to 3 days ago.  Pain is pressure-like in the center of his abdomen radiates down to both arms and up to the both jaws.  It is been intermittent.  No clear exacerbating or alleviating factors.  He does feel that symptoms are worse with movement but denies frank exertional symptoms.  Currently he rates his pain a 6 out of 10.  He has had associated nausea but no diaphoresis.  Mild shortness of breath.  Patient has no history of cardiac disease.  He follows with Dr. Ubaldo Glassing for a history of AV block and is pacemaker dependent.  He had a nuclear medicine stress test back in October of last year which showed some perfusion abnormality in the inferolateral septal region but his chest pain was atypical at this time and no further ischemic work-up was obtained.  He has had some cough over the last week but this is largely resolving.  No fevers or chills.  Denies lower extremity edema.  Of note patient did mention a short-lived episode of rectal bleeding this morning after an episode of diarrhea.  Blood is only when he wiped, did not fill the toilet.  He has no prior history of GI bleeding.  Denies melena or hematemesis.         Past Medical History:  Diagnosis Date   Anxiety    Bradycardia    GERD (gastroesophageal reflux disease)    History of kidney stones    Hyperlipidemia    Hypertension    Kidney stone    Neuropathy     Patient Active Problem List   Diagnosis Date Noted   Positive colorectal cancer screening using  Cologuard test 06/04/2020   Calculus of gallbladder without cholecystitis without obstruction 05/27/2020   Pallor of extremity 01/15/2020   Numbness and tingling of both legs below knees 06/10/2019   Loss of memory 06/10/2019   Difficulty walking 06/10/2019   Ataxia 06/10/2019   PAD (peripheral artery disease) ([place]) 05/25/2019   History of total knee arthroplasty 12/07/2018   CHB (complete heart block) ([place]) 04/20/2018   Atrioventricular (AV) dissociation 04/06/2018   Bradycardia 03/09/2018   Atypical chest pain 03/09/2018   Foot drop, bilateral 05/02/2017   Lumbar back pain with radiculopathy affecting right lower extremity 04/09/2016   Hx of decompressive lumbar laminectomy 04/09/2016   Anxiety 03/07/2015   Clinical depression 03/07/2015   Difficulty hearing 03/07/2015   Hypercholesteremia 03/07/2015   Arthritis, degenerative 03/07/2015   Malignant neoplasm of skin 03/07/2015   Chronic GERD 06/26/2009   External hemorrhoids without complication 89/21/1941   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    Past Surgical History:  Procedure Laterality Date   APPENDECTOMY  1994   BACK SURGERY     CARPAL TUNNEL RELEASE Right 2012   CARPAL TUNNEL RELEASE Left 02/06/2020   Procedure: CARPAL TUNNEL RELEASE;  Surgeon: Earnestine Leys, MD;  Location: [facility] ORS;  Service: Orthopedics;  Laterality: Left;  BLOCK   CATARACT EXTRACTION, BILATERAL  2019   COLONOSCOPY WITH PROPOFOL N/A 07/09/2020   Procedure: COLONOSCOPY WITH PROPOFOL;  Surgeon: Lin Landsman, MD;  Location: [facility] ENDOSCOPY;  Service: Gastroenterology;  Laterality: N/A;   CYSTOSCOPY W/ RETROGRADES Right 11/30/2019   Procedure: CYSTOSCOPY WITH RETROGRADE PYELOGRAM;  Surgeon: [name], MD;  Location: [facility] ORS;  Service: Urology;  Laterality: Right;   CYSTOSCOPY/URETEROSCOPY/HOLMIUM LASER/STENT PLACEMENT Right 11/30/2019   Procedure: CYSTOSCOPY/URETEROSCOPY/HOLMIUM LASER/STENT PLACEMENT;  Surgeon: [name], MD;  Location: [facility]  ORS;  Service: Urology;  Laterality: Right;   ESOPHAGOGASTRODUODENOSCOPY (EGD) WITH PROPOFOL N/A 07/09/2020   Procedure: ESOPHAGOGASTRODUODENOSCOPY (EGD) WITH PROPOFOL;  Surgeon: Lin Landsman, MD;  Location: [facility] ENDOSCOPY;  Service: Gastroenterology;  Laterality: N/A;   EYE SURGERY     INSERT / REPLACE / REMOVE PACEMAKER     JOINT REPLACEMENT     LUMBAR LAMINECTOMIES  1976 & 1978   Dr. 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   SIGMOIDOSCOPY  2010   TRIGGER FINGER RELEASE Left 02/06/2020   Procedure: RELEASE TRIGGER FINGER/A-1 PULLEY;  Surgeon: Earnestine Leys, MD;  Location: [facility] ORS;  Service: Orthopedics;  Laterality: Left;    Prior to Admission medications   Medication Sig Start Date End Date Taking? Authorizing Provider  acetaminophen (TYLENOL) 650 MG CR tablet Take 650 mg by mouth every 8 (eight) hours as needed for pain.    [provider]  busPIRone (BUSPAR) 15 MG tablet TAKE 1 TABLET TWICE DAILY 12/03/20   Chrismon, Simona Huh E, PA-C  donepezil (ARICEPT) 5 MG tablet Take 1 tablet (5 mg total) by mouth at bedtime. 11/03/20   Chrismon, Vickki Muff, PA-C  FLUoxetine (PROZAC) 20 MG capsule Take 1 capsule (20 mg total) by mouth daily. 02/23/21   Jerrol Banana., MD  meloxicam (MOBIC) 15 MG tablet Take 1 tablet (15 mg total) by mouth daily. 05/08/20   Mar Daring, PA-C  metoprolol succinate (TOPROL-XL) 25 MG 24 hr tablet Take 1 tablet by mouth daily. 04/24/20   [provider]  metoprolol succinate (TOPROL-XL) 50 MG 24 hr tablet Take 1 tablet by mouth daily. 08/21/20 08/21/21  [provider]  omeprazole (PRILOSEC) 20 MG capsule Take 1 capsule (20 mg total) by mouth daily. 08/15/20   Chrismon, Vickki Muff, PA-C  omeprazole (PRILOSEC) 40 MG capsule TAKE 1 CAPSULE (40 MG  TOTAL) BY MOUTH DAILY. 12/22/20   Jerrol Banana., MD  simvastatin (ZOCOR) 40 MG tablet Take 1 tablet (40 mg total) by mouth daily. 11/10/20   Chrismon, Vickki Muff, PA-C  SUPREP BOWEL PREP KIT 17.5-3.13-1.6 GM/177ML SOLN SMARTSIG:354 Milliliter(s) By Mouth Once Patient not taking: Reported on 11/03/2020 07/02/20   [provider]  tadalafil (CIALIS) 5 MG tablet Take 1 tablet (5 mg total) by mouth daily as needed for erectile dysfunction. 07/16/20   [name], MD    Allergies Amoxicillin-pot clavulanate  Family History  Problem Relation Age of Onset   Dementia Mother    GER disease Mother    Hypertension Father    Arrhythmia Sister    Heart murmur Sister     Social History Social History   Tobacco Use   Smoking status: Former    Packs/day: 2.00    Years: 30.00    Pack years: 60.00    Types: Cigarettes   Smokeless tobacco: Never   Tobacco comments:    quit in 1991-1992  Vaping Use   Vaping  Use: Never used  Substance Use Topics   Alcohol use: Not Currently    Alcohol/week: 0.0 standard drinks    Comment: quit over 30 years ago   Drug use: No    Review of Systems   Review of Systems  Constitutional:  Negative for chills and fever.  Respiratory:  Positive for chest tightness and shortness of breath.   Cardiovascular:  Positive for chest pain. Negative for leg swelling.  Gastrointestinal:  Positive for nausea. Negative for abdominal pain and vomiting.  Musculoskeletal:  Negative for back pain.  All other systems reviewed and are negative.  Physical Exam Updated Vital Signs BP (!) 170/91   Pulse (!) 59   Temp 98.2 F (36.8 C) (Oral)   Resp 20   Ht 5' 9"  (1.753 m)   Wt 88.5 kg   SpO2 98%   BMI 28.80 kg/m   Physical Exam Vitals and nursing note reviewed.  Constitutional:      General: He is not in acute distress.    Appearance: Normal appearance.  HENT:     Head: Normocephalic and atraumatic.  Eyes:     General: No scleral icterus.     Conjunctiva/sclera: Conjunctivae normal.  Pulmonary:     Effort: Pulmonary effort is normal. No respiratory distress.     Breath sounds: Normal breath sounds. No wheezing.  Abdominal:     Palpations: Abdomen is soft.     Tenderness: There is no abdominal tenderness. There is no guarding.  Musculoskeletal:        General: No deformity or signs of injury.     Cervical back: Normal range of motion.     Comments: Bilateral lower extremities are cold which patient tells me is his baseline, 1+ lower extremity edema, no palpable DP pulse  Skin:    Coloration: Skin is not jaundiced or pale.  Neurological:     General: No focal deficit present.     Mental Status: He is alert and oriented to person, place, and time. Mental status is at baseline.  Psychiatric:        Mood and Affect: Mood normal.        Behavior: Behavior normal.     LABS (all labs ordered are listed, but only abnormal results are displayed)  Labs Reviewed  BASIC METABOLIC PANEL - Abnormal; Notable for the following components:      Result Value   Sodium 132 (*)    Potassium 3.4 (*)    Glucose, Bld 114 (*)    All other components within normal limits  CBC - Abnormal; Notable for the following components:   WBC 10.6 (*)    All other components within normal limits  TROPONIN I (HIGH SENSITIVITY) - Abnormal; Notable for the following components:   Troponin I (High Sensitivity) 1,839 (*)    All other components within normal limits  RESP PANEL BY RT-PCR (FLU A&B, COVID) ARPGX2  APTT  TROPONIN I (HIGH SENSITIVITY)   ____________________________________________  EKG  AV paced rhythm, no Sgarbossa criteria ____________________________________________  RADIOLOGY Almeta Monas, personally viewed and evaluated these images (plain radiographs) as part of my medical decision making, as well as reviewing the written report by the radiologist.  ED MD interpretation:  I reviewed the CXR which does not show any acute  cardiopulmonary process      ____________________________________________   PROCEDURES  Procedure(s) performed (including Critical Care):  .Critical Care Performed by: Rada Hay, MD Authorized by: Rada Hay, MD  Critical care provider statement:    Critical care time (minutes):  30   Critical care was time spent personally by me on the following activities:  Development of treatment plan with patient or surrogate, discussions with consultants, evaluation of patient's response to treatment, examination of patient, ordering and review of laboratory studies, ordering and review of radiographic studies, ordering and performing treatments and interventions, pulse oximetry, re-evaluation of patient's condition and review of old charts   ____________________________________________   INITIAL IMPRESSION / [place] / ED COURSE     Patient is a 75 year old male past medical history of AV block and is pacemaker dependent who presents with chest pain x2 to 3 days.  Pain is typical and that it is pressure-like radiating to bilateral arms and jaw with some associated dyspnea and nausea.  He is hypertensive here blood pressure 170/90 and somewhat bradycardic heart rate in the 50s.  He overall appears very well and is comfortable on exam.  Tells me his pain is about a 6 out of 10.  His initial troponin is elevated at 1800.  EKG shows an AV paced rhythm that is identical in morphology to his prior EKG from 2021 and without changes meeting Sgarbossa criteria.  Of note patient did mention an episode of bleeding when he wiped this morning however on my rectal exam there are external hemorrhoids and brown stool no melena or blood.  His hemoglobin is baseline.  Suspect very mild hemorrhoidal bleeding and do not feel that this is a contraindication to giving him heparin.  Discussed with Dr. Clayborn Bigness would he recommends controlling his blood pressure with dose of IV enalaprilat.   Will give him nitro aspirin and start heparin.  Will admit to the hospitalist service.      ____________________________________________   FINAL CLINICAL IMPRESSION(S) / ED DIAGNOSES  Final diagnoses:  NSTEMI (non-ST elevated myocardial infarction) [facility])     ED Discharge Orders     None        Note:  This document was prepared using Dragon voice recognition software and may include unintentional dictation errors.    Rada Hay, MD 02/26/21 [phone]

## 2021-02-26 NOTE — Progress Notes (Signed)
ANTICOAGULATION CONSULT NOTE  Pharmacy Consult for heparin Indication: chest pain/ACS  Patient Measurements: Height: 5' 9"  (175.3 cm) Weight: 88.5 kg (195 lb) IBW/kg (Calculated) : 70.7  Vital Signs: Temp: 98.2 F (36.8 C) (12/01 1445) Temp Source: Oral (12/01 1445) BP: 170/91 (12/01 1645) Pulse Rate: 59 (12/01 1645)  Labs: Recent Labs    02/26/21 1450 02/26/21 1622  HGB 15.4  --   HCT 44.7  --   PLT 206  --   CREATININE 0.72  --   TROPONINIHS 1,839* 1,758*    Estimated Creatinine Clearance: 85.1 mL/min (by C-G formula based on SCr of 0.72 mg/dL).   Medical History: Past Medical History:  Diagnosis Date   Anxiety    Bradycardia    GERD (gastroesophageal reflux disease)    History of kidney stones    Hyperlipidemia    Hypertension    Kidney stone    Neuropathy     Assessment: 52 YOM w/ PMH of HTN, HLD  complete heart block s/p pacemaker presents with chest pain and elevated troponins and is being started on a heparin infusion. According to a medication dispense history report he is on no chronic anticoagulation prior to admission  Goal of Therapy:  Heparin level 0.3-0.7 units/ml Monitor platelets by anticoagulation protocol: Yes   Plan:  Give 4000 units bolus x 1 Start heparin infusion at 1100 units/hr Check anti-Xa level in 8 hours and daily while on heparin Continue to monitor H&H and platelets  Dallie Piles 02/26/2021,5:11 PM

## 2021-02-26 NOTE — H&P (Addendum)
History and Physical   Justin Warren:[id number] DOB: 1945-06-10 DOA: 02/26/2021  PCP: Tania Ade (Inactive)  Outpatient Specialists: Dr. Conni Elliot clinic cardiology Patient coming from: Home  I have personally briefly reviewed patient's old medical records in [name].  Chief Concern: Chest pain and shortness of breath  HPI: Justin Warren is a 75 y.o. 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  He felt like a 'house was sitting on him' on Tuesday. He reports he has been taking 650 mg tylenol which eased the pain minimally. The pressure radiated to his neck and bilateral arms.  He described this as persistent, thus prompting him to call [facility] today, on day of admission, who advised patient to call 911 immediately and wait for them to get him.   He also endorsed persistent chronic shortness of breath since pacemaker placement in January 2020, especially with exertion. This has been unchanged.   He denies nausea, vomiting, dysuria, syncope, loss of consciousness, chills, fever, new cough.  He endorsed watery diarrhea, once time, brown.   Social history: He lives at home with his wife. He is former tobacco user, quitting 30 years ago. At his peak, he smoked 3 ppd. He infrequently drinks etoh, and denies etoh. He is retired and formerly worked as Software engineer.   Vaccination history: He is vaccinated for covid 19, with three doses  ROS: Constitutional: no weight change, no fever ENT/Mouth: no sore throat, no rhinorrhea Eyes: no eye pain, no vision changes Cardiovascular: no chest pain, no dyspnea,  no edema, no palpitations Respiratory: no cough, no sputum, no wheezing Gastrointestinal: no nausea, no  vomiting, no diarrhea, no constipation Genitourinary: no urinary incontinence, no dysuria, no hematuria Musculoskeletal: no arthralgias, no myalgias Skin: no skin lesions, no pruritus, Neuro: + weakness, no loss of consciousness, no syncope Psych: no anxiety, no depression, + decrease appetite Heme/Lymph: no bruising, no bleeding  ED Course: Discussed with emergency medicine provider, patient requiring hospitalization for chief concerns of NSTEMI.  Vitals in the emergency department was remarkable for temperature of 98.2, respiration rate of 18, heart rate of 59, initial blood pressure 168 over 74 and improved to 162/86, SPO2 of 95% on room air.  Labs in the emergency department was remarkable for serum sodium 132, potassium 3.4, chloride 101, bicarb of 24, BUN of 16, serum creatinine of 0.72, nonfasting blood glucose 114, WBC 10.6, hemoglobin 15.4, platelets 206, high sensitive troponin was elevated at 1839, and decreased to 1758 on second recheck.  COVID/influenza A/influenza B PCR were negative.  In the emergency department patient was given 1 dose of aspirin 324 mg p.o., heparin bolus and started on heparin GTT, nitro 0.4 mg tablet once.  ED provider called cardiology, Dr. Call would recommended blood pressure control with enalaprilat and admit patient for a left heart cath that has been scheduled for 02/27/2021.  Assessment/Plan  Principal Problem:   NSTEMI (non-ST elevated myocardial infarction) ([place]) Active Problems:   Anxiety   Chronic GERD   Clinical depression   Hypercholesteremia   CHB (complete heart block) (HCC)   PAD (peripheral artery disease) (HCC)   Loss of memory   # NSTEMI - Cardiology has been consulted, we appreciate further recommendations - Continue heparin GTT - N.p.o. after midnight in anticipation of left heart  cath a.m. of 02/27/2021 - Check BNP - Patient will need to complete echo post cardiac cath - Lipid panel fasting, in the a.m. ordered - Admit to  progressive cardiac unit, observation, with telemetry  # CAD- metoprolol succinate 25 mg daily resumed  # History of complete heart block-status post dual-chamber cardiac pacemaker placement on 04/20/2018  # Hyperlipidemia-simvastatin 40 mg nightly resumed  # Depression/anxiety - Patient takes buspirone 15 mg twice daily resumed, fluoxetine 20 mg daily resumed  # Memory loss-donepezil 5 mg nightly daily resumed  # GERD-PPI  Chart reviewed.   History of cystoscopy/ureteroscopy/holmium laser/stent placement on the right on 11/30/2019  Left carpal tunnel release on 02/06/2020 Right carpal tunnel release in 2012 History of appendectomy 1994  DVT prophylaxis: Heparin GTT Code Status: Full code Diet: Heart healthy; n.p.o. after midnight Family Communication: No, patient states his wife knows that he is in the hospital Disposition Plan: Pending clinical course Consults called: Cardiology Admission status: Progressive cardiac, observation  Past Medical History:  Diagnosis Date   Anxiety    Bradycardia    GERD (gastroesophageal reflux disease)    History of kidney stones    Hyperlipidemia    Hypertension    Kidney stone    Neuropathy    Past Surgical History:  Procedure Laterality Date   APPENDECTOMY  1994   BACK SURGERY     CARPAL TUNNEL RELEASE Right 2012   CARPAL TUNNEL RELEASE Left 02/06/2020   Procedure: CARPAL TUNNEL RELEASE;  Surgeon: Earnestine Leys, MD;  Location: [facility] ORS;  Service: Orthopedics;  Laterality: Left;  BLOCK   CATARACT EXTRACTION, BILATERAL  2019   COLONOSCOPY WITH PROPOFOL N/A 07/09/2020   Procedure: COLONOSCOPY WITH PROPOFOL;  Surgeon: Lin Landsman, MD;  Location: [facility] ENDOSCOPY;  Service: Gastroenterology;  Laterality: N/A;   CYSTOSCOPY W/ RETROGRADES Right 11/30/2019   Procedure: CYSTOSCOPY WITH RETROGRADE PYELOGRAM;  Surgeon: [name], MD;  Location: [facility] ORS;  Service: Urology;  Laterality: Right;   CYSTOSCOPY/URETEROSCOPY/HOLMIUM  LASER/STENT PLACEMENT Right 11/30/2019   Procedure: CYSTOSCOPY/URETEROSCOPY/HOLMIUM LASER/STENT PLACEMENT;  Surgeon: [name], MD;  Location: [facility] ORS;  Service: Urology;  Laterality: Right;   ESOPHAGOGASTRODUODENOSCOPY (EGD) WITH PROPOFOL N/A 07/09/2020   Procedure: ESOPHAGOGASTRODUODENOSCOPY (EGD) WITH PROPOFOL;  Surgeon: Lin Landsman, MD;  Location: [facility] ENDOSCOPY;  Service: Gastroenterology;  Laterality: N/A;   EYE SURGERY     INSERT / REPLACE / REMOVE PACEMAKER     JOINT REPLACEMENT     LUMBAR LAMINECTOMIES  1976 & 1978   Dr. 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   SIGMOIDOSCOPY  2010   TRIGGER FINGER RELEASE Left 02/06/2020   Procedure: RELEASE TRIGGER FINGER/A-1 PULLEY;  Surgeon: Earnestine Leys, MD;  Location: [facility] ORS;  Service: Orthopedics;  Laterality: Left;   Social History:  reports that he has quit smoking. His smoking use included cigarettes. He has a 60.00 pack-year smoking history. He has never used smokeless tobacco. He reports that he does not currently use alcohol. He reports that he does not use drugs.  Allergies  Allergen Reactions   Amoxicillin-Pot Clavulanate Nausea And Vomiting    DID THE REACTION INVOLVE: Swelling of the face/tongue/throat, SOB, or low BP? No Sudden or severe rash/hives, skin peeling, or the inside of the mouth or nose? No Did it require medical treatment? No When did it last happen?  Over 10 years ago If all above answers are "NO", may proceed with cephalosporin use.   Family History  Problem Relation Age of Onset   Dementia Mother    GER disease Mother    Hypertension Father    Arrhythmia Sister    Heart murmur Sister    Family history: Family history reviewed and pertinent for hypertension in father and dementia in mother.  Prior to  Admission medications   Medication Sig Start Date End Date Taking? Authorizing Provider  acetaminophen (TYLENOL) 650 MG CR tablet Take 650 mg by mouth every 8 (eight) hours as needed for pain.    [provider]  busPIRone (BUSPAR) 15 MG tablet TAKE 1 TABLET TWICE DAILY 12/03/20   Chrismon, Simona Huh E, PA-C  donepezil (ARICEPT) 5 MG tablet Take 1 tablet (5 mg total) by mouth at bedtime. 11/03/20   Chrismon, Vickki Muff, PA-C  FLUoxetine (PROZAC) 20 MG capsule Take 1 capsule (20 mg total) by mouth daily. 02/23/21   Jerrol Banana., MD  meloxicam (MOBIC) 15 MG tablet Take 1 tablet (15 mg total) by mouth daily. 05/08/20   Mar Daring, PA-C  metoprolol succinate (TOPROL-XL) 25 MG 24 hr tablet Take 1 tablet by mouth daily. 04/24/20   [provider]  metoprolol succinate (TOPROL-XL) 50 MG 24 hr tablet Take 1 tablet by mouth daily. 08/21/20 08/21/21  [provider]  omeprazole (PRILOSEC) 20 MG capsule Take 1 capsule (20 mg total) by mouth daily. 08/15/20   Chrismon, Vickki Muff, PA-C  omeprazole (PRILOSEC) 40 MG capsule TAKE 1 CAPSULE (40 MG TOTAL) BY MOUTH DAILY. 12/22/20   Jerrol Banana., MD  simvastatin (ZOCOR) 40 MG tablet Take 1 tablet (40 mg total) by mouth daily. 11/10/20   Chrismon, Vickki Muff, PA-C  SUPREP BOWEL PREP KIT 17.5-3.13-1.6 GM/177ML SOLN SMARTSIG:354 Milliliter(s) By Mouth Once Patient not taking: Reported on 11/03/2020 07/02/20   [provider]  tadalafil (CIALIS) 5 MG tablet Take 1 tablet (5 mg total) by mouth daily as needed for erectile dysfunction. 07/16/20   [name], MD   Physical Exam: Vitals:   02/26/21 1445 02/26/21 1446 02/26/21 1645 02/26/21 1713  BP: (!) 168/74  (!) 170/91 139/90  Pulse: (!) 59  (!) 59 62  Resp: 18  20 17   Temp: 98.2 F (36.8 C)     TempSrc: Oral     SpO2: 95%  98% 96%  Weight:  88.5 kg    Height:  5' 9"  (1.753 m)     Constitutional: appears age-appropriate, NAD, calm, comfortable Eyes: PERRL, lids  and conjunctivae normal ENMT: Mucous membranes are moist. Posterior pharynx clear of any exudate or lesions. Age-appropriate dentition.  Mild hearing loss Neck: normal, supple, no masses, no thyromegaly Respiratory: clear to auscultation bilaterally, no wheezing, no crackles. Normal respiratory effort. No accessory muscle use.  Cardiovascular: Regular rate and rhythm, no murmurs / rubs / gallops. No extremity edema. 2+ pedal pulses. No carotid bruits.  Abdomen: Obese abdomen, no tenderness, no masses palpated, no hepatosplenomegaly. Bowel sounds positive.  Musculoskeletal: no clubbing / cyanosis. No joint deformity upper and lower extremities. Good ROM, no contractures, no atrophy. Normal muscle tone.  Skin: no rashes, lesions, ulcers. No induration.  Multiple chronic tattoos present on admission, appears to be well healed Neurologic: Sensation intact. Strength 5/5 in all 4.  Psychiatric: Normal judgment and insight. Alert and oriented x 3. Normal mood.   EKG: independently reviewed, showing AV dual paced rhythm with rate  of 62, QTc 485  Chest x-ray on Admission: I personally reviewed and I agree with radiologist reading as below.  DG Chest 2 View  Result Date: 02/26/2021 CLINICAL DATA:  Chest pain EXAM: CHEST - 2 VIEW COMPARISON:  04/20/2018 FINDINGS: Cardiomegaly with left chest multi lead pacer. Both lungs are clear. Disc degenerative disease of the thoracic spine. IMPRESSION: Cardiomegaly without acute abnormality of the lungs. Electronically Signed   By: Delanna Ahmadi M.D.   On: 02/26/2021 15:19    Labs on Admission: I have personally reviewed following labs  CBC: Recent Labs  Lab 02/26/21 1450  WBC 10.6*  HGB 15.4  HCT 44.7  MCV 82.8  PLT 709   Basic Metabolic Panel: Recent Labs  Lab 02/26/21 1450  NA 132*  K 3.4*  CL 101  CO2 24  GLUCOSE 114*  BUN 16  CREATININE 0.72  CALCIUM 9.0   GFR: Estimated Creatinine Clearance: 85.1 mL/min (by C-G formula based on SCr of  0.72 mg/dL).  Urine analysis:    Component Value Date/Time   COLORURINE YELLOW (A) 11/24/2019 1644   APPEARANCEUR Hazy (A) 12/06/2019 1335   LABSPEC >1.046 (H) 11/24/2019 1644   PHURINE 5.0 11/24/2019 1644   GLUCOSEU Negative 12/06/2019 1335   HGBUR SMALL (A) 11/24/2019 1644   BILIRUBINUR Negative 12/06/2019 1335   KETONESUR 20 (A) 11/24/2019 1644   PROTEINUR 2+ (A) 12/06/2019 1335   PROTEINUR NEGATIVE 11/24/2019 1644   UROBILINOGEN 0.2 03/09/2018 0824   NITRITE Negative 12/06/2019 1335   NITRITE NEGATIVE 11/24/2019 1644   LEUKOCYTESUR 1+ (A) 12/06/2019 1335   LEUKOCYTESUR NEGATIVE 11/24/2019 1644   Dr. Tobie Poet [facility]  If 7PM-7AM, please contact overnight-coverage provider If 7AM-7PM, please contact day coverage provider [email or web address]  02/26/2021, 7:35 PM

## 2021-02-26 NOTE — ED Provider Notes (Signed)
Emergency Medicine Provider Triage Evaluation Note  Justin Warren, a 75 y.o. male  was evaluated in triage.  Pt complains of central chest pain with bilateral upper extremity radiation to the last 2 to 3 days.  Patient does have an internal pacer, status post CABG in 1989.  He denies any nausea, vomiting, diaphoresis, or syncope.  Review of Systems  Positive: CP Negative: SOB, diaphoresis  Physical Exam  BP (!) 168/74 (BP Location: Left Arm)   Pulse (!) 59   Temp 98.2 F (36.8 C) (Oral)   Resp 18   Ht 5' 9"  (1.753 m)   Wt 88.5 kg   SpO2 95%   BMI 28.80 kg/m  Gen:   Awake, no distress  NAD Resp:  Normal effort CTA MSK:   Moves extremities without difficulty  Other:  CVS: RRR paced  Medical Decision Making  Medically screening exam initiated at 2:51 PM.  Appropriate orders placed.  Justin Warren was informed that the remainder of the evaluation will be completed by another provider, this initial triage assessment does not replace that evaluation, and the importance of remaining in the ED until their evaluation is complete.  Patient with ED evaluation of intermittent chest pain with bilateral upper extremities for the last 2 to 3 days.   Melvenia Needles, PA-C 02/26/21 1453    Duffy Bruce, MD 02/26/21 2208

## 2021-02-26 NOTE — Telephone Encounter (Signed)
Reason for Disposition  [1] Chest pain lasts > 5 minutes AND [2] age > 5 AND [3] one or more cardiac risk factors (e.g., diabetes, high blood pressure, high cholesterol, smoker, or strong family history of heart disease)  Answer Assessment - Initial Assessment Questions 1. LOCATION: "Where does it hurt?"       I called pt back.   Line disconnected.  I'm having pain in my chest and down back of both arms.  2. RADIATION: "Does the pain go anywhere else?" (e.g., into neck, jaw, arms, back)     Also into my throat.   I recently had bronchitis. 3. ONSET: "When did the chest pain begin?" (Minutes, hours or days)      About 30 minutes ago 4. PATTERN "Does the pain come and go, or has it been constant since it started?"  "Does it get worse with exertion?"      It's pretty constant.   I took 2 500 mg Tylenol.    This happened night before last .   After an hour it went away. 5. DURATION: "How long does it last" (e.g., seconds, minutes, hours)     See above 6. SEVERITY: "How bad is the pain?"  (e.g., Scale 1-10; mild, moderate, or severe)    - MILD (1-3): doesn't interfere with normal activities     - MODERATE (4-7): interferes with normal activities or awakens from sleep    - SEVERE (8-10): excruciating pain, unable to do any normal activities       6-7/10 7. CARDIAC RISK FACTORS: "Do you have any history of heart problems or risk factors for heart disease?" (e.g., angina, prior heart attack; diabetes, high blood pressure, high cholesterol, smoker, or strong family history of heart disease)     I have a pacemaker   I'm wondering if I'm having a heart attack.    I instructed him to call 911 now which he was agreeable to doing.    8. PULMONARY RISK FACTORS: "Do you have any history of lung disease?"  (e.g., blood clots in lung, asthma, emphysema, birth control pills)     N/A 9. CAUSE: "What do you think is causing the chest pain?"     I wonder if I'm having a heart attack now. 10. OTHER SYMPTOMS: "Do  you have any other symptoms?" (e.g., dizziness, nausea, vomiting, sweating, fever, difficulty breathing, cough)       Not asked 11. PREGNANCY: "Is there any chance you are pregnant?" "When was your last menstrual period?"       N/A  Protocols used: Chest Pain-A-AH

## 2021-02-26 NOTE — ED Triage Notes (Signed)
Pt brought in by [facility] with c/o Mid sternal CP that radiates down both arms for 2 days off and on. 179/108, HR paced at 60 CBG 144. CABG in 1999.

## 2021-02-26 NOTE — Telephone Encounter (Signed)
FYI

## 2021-02-26 NOTE — Telephone Encounter (Signed)
Pt called in c/o having chest pain that is radiating down the back of both arms and into his neck area.  He has a pacemaker.    I instructed him to call 91 right away which he was agreeable to doing now.  See triage notes  I sent my notes to [facility]

## 2021-02-27 ENCOUNTER — Other Ambulatory Visit: Payer: Self-pay

## 2021-02-27 ENCOUNTER — Encounter
Admission: EM | Disposition: A | Discharge status: Home / Self Care | Payer: Self-pay | Source: Home / Self Care | Attending: Internal Medicine

## 2021-02-27 DIAGNOSIS — Z87891 Personal history of nicotine dependence: Secondary | ICD-10-CM | POA: Diagnosis not present

## 2021-02-27 DIAGNOSIS — Z20822 Contact with and (suspected) exposure to covid-19: Secondary | ICD-10-CM | POA: Diagnosis present

## 2021-02-27 DIAGNOSIS — Z950 Presence of cardiac pacemaker: Secondary | ICD-10-CM | POA: Diagnosis not present

## 2021-02-27 DIAGNOSIS — F32A Depression, unspecified: Secondary | ICD-10-CM | POA: Diagnosis present

## 2021-02-27 DIAGNOSIS — Z87442 Personal history of urinary calculi: Secondary | ICD-10-CM | POA: Diagnosis not present

## 2021-02-27 DIAGNOSIS — Z96651 Presence of right artificial knee joint: Secondary | ICD-10-CM | POA: Diagnosis present

## 2021-02-27 DIAGNOSIS — Z79899 Other long term (current) drug therapy: Secondary | ICD-10-CM | POA: Diagnosis not present

## 2021-02-27 DIAGNOSIS — F419 Anxiety disorder, unspecified: Secondary | ICD-10-CM | POA: Diagnosis present

## 2021-02-27 DIAGNOSIS — Z6828 Body mass index (BMI) 28.0-28.9, adult: Secondary | ICD-10-CM | POA: Diagnosis not present

## 2021-02-27 DIAGNOSIS — K219 Gastro-esophageal reflux disease without esophagitis: Secondary | ICD-10-CM | POA: Diagnosis present

## 2021-02-27 DIAGNOSIS — I119 Hypertensive heart disease without heart failure: Secondary | ICD-10-CM | POA: Diagnosis present

## 2021-02-27 DIAGNOSIS — R197 Diarrhea, unspecified: Secondary | ICD-10-CM | POA: Diagnosis present

## 2021-02-27 DIAGNOSIS — R413 Other amnesia: Secondary | ICD-10-CM | POA: Diagnosis present

## 2021-02-27 DIAGNOSIS — R001 Bradycardia, unspecified: Secondary | ICD-10-CM | POA: Diagnosis present

## 2021-02-27 DIAGNOSIS — I214 Non-ST elevation (NSTEMI) myocardial infarction: Secondary | ICD-10-CM | POA: Diagnosis present

## 2021-02-27 DIAGNOSIS — I2510 Atherosclerotic heart disease of native coronary artery without angina pectoris: Secondary | ICD-10-CM | POA: Diagnosis present

## 2021-02-27 DIAGNOSIS — I739 Peripheral vascular disease, unspecified: Secondary | ICD-10-CM | POA: Diagnosis present

## 2021-02-27 DIAGNOSIS — M21371 Foot drop, right foot: Secondary | ICD-10-CM | POA: Diagnosis present

## 2021-02-27 DIAGNOSIS — E669 Obesity, unspecified: Secondary | ICD-10-CM | POA: Diagnosis present

## 2021-02-27 DIAGNOSIS — E7800 Pure hypercholesterolemia, unspecified: Secondary | ICD-10-CM | POA: Diagnosis present

## 2021-02-27 DIAGNOSIS — K625 Hemorrhage of anus and rectum: Secondary | ICD-10-CM | POA: Diagnosis present

## 2021-02-27 DIAGNOSIS — I442 Atrioventricular block, complete: Secondary | ICD-10-CM | POA: Diagnosis present

## 2021-02-27 DIAGNOSIS — M21372 Foot drop, left foot: Secondary | ICD-10-CM | POA: Diagnosis present

## 2021-02-27 DIAGNOSIS — Z8249 Family history of ischemic heart disease and other diseases of the circulatory system: Secondary | ICD-10-CM | POA: Diagnosis not present

## 2021-02-27 HISTORY — PX: CORONARY STENT INTERVENTION: CATH118234

## 2021-02-27 HISTORY — PX: LEFT HEART CATH AND CORONARY ANGIOGRAPHY: CATH118249

## 2021-02-27 LAB — CBC
HCT: 45.7 % (ref 39.0–52.0)
Hemoglobin: 15.7 g/dL (ref 13.0–17.0)
MCH: 28.8 pg (ref 26.0–34.0)
MCHC: 34.4 g/dL (ref 30.0–36.0)
MCV: 83.9 fL (ref 80.0–100.0)
Platelets: 229 10*3/uL (ref 150–400)
RBC: 5.45 MIL/uL (ref 4.22–5.81)
RDW: 13.7 % (ref 11.5–15.5)
WBC: 9.9 10*3/uL (ref 4.0–10.5)
nRBC: 0 % (ref 0.0–0.2)

## 2021-02-27 LAB — LIPID PANEL
Cholesterol: 155 mg/dL (ref 0–200)
HDL: 45 mg/dL (ref >40)
LDL Cholesterol: 83 mg/dL (ref 0–99)
Total CHOL/HDL Ratio: 3.4 RATIO
Triglycerides: 135 mg/dL (ref <150)
VLDL: 27 mg/dL (ref 0–40)

## 2021-02-27 LAB — BASIC METABOLIC PANEL
Anion gap: 6 (ref 5–15)
BUN: 13 mg/dL (ref 8–23)
CO2: 30 mmol/L (ref 22–32)
Calcium: 9.4 mg/dL (ref 8.9–10.3)
Chloride: 101 mmol/L (ref 98–111)
Creatinine, Ser: 0.79 mg/dL (ref 0.61–1.24)
GFR, Estimated: >60 mL/min (ref >60)
Glucose, Bld: 98 mg/dL (ref 70–99)
Potassium: 3.7 mmol/L (ref 3.5–5.1)
Sodium: 137 mmol/L (ref 135–145)

## 2021-02-27 LAB — CBG MONITORING, ED: Glucose-Capillary: 102 mg/dL — ABNORMAL HIGH (ref 70–99)

## 2021-02-27 LAB — POCT ACTIVATED CLOTTING TIME: Activated Clotting Time: 528 seconds

## 2021-02-27 LAB — HEPARIN LEVEL (UNFRACTIONATED)
Heparin Unfractionated: 0.68 IU/mL (ref 0.30–0.70)
Heparin Unfractionated: 0.44 IU/mL (ref 0.30–0.70)

## 2021-02-27 SURGERY — LEFT HEART CATH AND CORONARY ANGIOGRAPHY
Anesthesia: Moderate Sedation

## 2021-02-27 MED ORDER — SODIUM CHLORIDE 0.9% FLUSH: 3.0000 mL | INTRAVENOUS | Status: DC | PRN

## 2021-02-27 MED ORDER — SODIUM CHLORIDE 0.9 % IV SOLN
INTRAVENOUS | Status: AC | PRN
  Administered 2021-02-27: 1.75 mg/kg/h via INTRAVENOUS

## 2021-02-27 MED ORDER — FENTANYL CITRATE (PF) 100 MCG/2ML IJ SOLN
INTRAMUSCULAR | Status: AC
  Filled 2021-02-27: qty 2

## 2021-02-27 MED ORDER — VERAPAMIL HCL 2.5 MG/ML IV SOLN
INTRAVENOUS | Status: AC
  Filled 2021-02-27: qty 2

## 2021-02-27 MED ORDER — LIDOCAINE HCL (PF) 1 % IJ SOLN
INTRAMUSCULAR | Status: DC | PRN
  Administered 2021-02-27: 2 mL
  Administered 2021-02-27: 15 mL

## 2021-02-27 MED ORDER — HEPARIN (PORCINE) IN NACL 1000-0.9 UT/500ML-% IV SOLN
INTRAVENOUS | Status: AC
  Filled 2021-02-27: qty 1000

## 2021-02-27 MED ORDER — SODIUM CHLORIDE 0.9 % WEIGHT BASED INFUSION
3.0000 mL/kg/h | INTRAVENOUS | Status: DC
  Administered 2021-02-27: 3 mL/kg/h via INTRAVENOUS

## 2021-02-27 MED ORDER — LIDOCAINE HCL 1 % IJ SOLN
INTRAMUSCULAR | Status: AC
  Filled 2021-02-27: qty 20

## 2021-02-27 MED ORDER — MIDAZOLAM HCL 2 MG/2ML IJ SOLN
INTRAMUSCULAR | Status: DC | PRN
  Administered 2021-02-27 (×2): 1 mg via INTRAVENOUS

## 2021-02-27 MED ORDER — CLOPIDOGREL BISULFATE 75 MG PO TABS
75.0000 mg | ORAL_TABLET | Freq: Every day | ORAL | Status: DC
  Administered 2021-02-28: 75 mg via ORAL
  Filled 2021-02-27: qty 1

## 2021-02-27 MED ORDER — ACETAMINOPHEN 325 MG PO TABS: 650.0000 mg | ORAL_TABLET | ORAL | Status: DC | PRN

## 2021-02-27 MED ORDER — ASPIRIN 81 MG PO CHEW
CHEWABLE_TABLET | ORAL | Status: AC
  Filled 2021-02-27: qty 1

## 2021-02-27 MED ORDER — BIVALIRUDIN TRIFLUOROACETATE 250 MG IV SOLR
INTRAVENOUS | Status: AC
  Filled 2021-02-27: qty 250

## 2021-02-27 MED ORDER — HEPARIN SODIUM (PORCINE) 1000 UNIT/ML IJ SOLN
INTRAMUSCULAR | Status: AC
  Filled 2021-02-27: qty 10

## 2021-02-27 MED ORDER — SODIUM CHLORIDE 0.9 % IV SOLN: 250.0000 mL | INTRAVENOUS | Status: DC | PRN

## 2021-02-27 MED ORDER — CLOPIDOGREL BISULFATE 75 MG PO TABS
ORAL_TABLET | ORAL | Status: AC
  Filled 2021-02-27: qty 8

## 2021-02-27 MED ORDER — SODIUM CHLORIDE 0.9 % WEIGHT BASED INFUSION: 1.0000 mL/kg/h | INTRAVENOUS | Status: DC

## 2021-02-27 MED ORDER — CLOPIDOGREL BISULFATE 75 MG PO TABS
ORAL_TABLET | ORAL | Status: DC | PRN
  Administered 2021-02-27: 600 mg via ORAL

## 2021-02-27 MED ORDER — MIDAZOLAM HCL 2 MG/2ML IJ SOLN
INTRAMUSCULAR | Status: AC
  Filled 2021-02-27: qty 2

## 2021-02-27 MED ORDER — BIVALIRUDIN BOLUS VIA INFUSION - CUPID
INTRAVENOUS | Status: DC | PRN
  Administered 2021-02-27: 66.375 mg via INTRAVENOUS

## 2021-02-27 MED ORDER — FENTANYL CITRATE (PF) 100 MCG/2ML IJ SOLN
INTRAMUSCULAR | Status: DC | PRN
  Administered 2021-02-27: 25 ug via INTRAVENOUS
  Administered 2021-02-27: 50 ug via INTRAVENOUS
  Administered 2021-02-27: 25 ug via INTRAVENOUS

## 2021-02-27 MED ORDER — VERAPAMIL HCL 2.5 MG/ML IV SOLN
INTRAVENOUS | Status: DC | PRN
  Administered 2021-02-27 (×2): 2.5 mg via INTRA_ARTERIAL

## 2021-02-27 MED ORDER — LABETALOL HCL 5 MG/ML IV SOLN: 10.0000 mg | INTRAVENOUS | Status: AC | PRN

## 2021-02-27 MED ORDER — SODIUM CHLORIDE 0.9 % IV SOLN
0.2500 mg/kg/h | INTRAVENOUS | Status: AC
  Filled 2021-02-27: qty 250

## 2021-02-27 MED ORDER — HEPARIN SODIUM (PORCINE) 1000 UNIT/ML IJ SOLN
INTRAMUSCULAR | Status: DC | PRN
  Administered 2021-02-27: 4500 [IU] via INTRAVENOUS

## 2021-02-27 MED ORDER — ASPIRIN 81 MG PO CHEW
81.0000 mg | CHEWABLE_TABLET | Freq: Every day | ORAL | Status: DC
  Administered 2021-02-28: 81 mg via ORAL
  Filled 2021-02-27: qty 1

## 2021-02-27 MED ORDER — HEPARIN (PORCINE) IN NACL 1000-0.9 UT/500ML-% IV SOLN
INTRAVENOUS | Status: DC | PRN
  Administered 2021-02-27 (×2): 500 mL

## 2021-02-27 MED ORDER — LABETALOL HCL 5 MG/ML IV SOLN
INTRAVENOUS | Status: DC | PRN
  Administered 2021-02-27: 20 mg via INTRAVENOUS

## 2021-02-27 MED ORDER — HYDRALAZINE HCL 20 MG/ML IJ SOLN: 10.0000 mg | INTRAMUSCULAR | Status: AC | PRN

## 2021-02-27 MED ORDER — SODIUM CHLORIDE 0.9 % WEIGHT BASED INFUSION
1.0000 mL/kg/h | INTRAVENOUS | Status: AC
  Administered 2021-02-27: 1 mL/kg/h via INTRAVENOUS

## 2021-02-27 MED ORDER — ONDANSETRON HCL 4 MG/2ML IJ SOLN: 4.0000 mg | Freq: Four times a day (QID) | INTRAMUSCULAR | Status: DC | PRN

## 2021-02-27 MED ORDER — IOHEXOL 350 MG/ML SOLN
INTRAVENOUS | Status: DC | PRN
  Administered 2021-02-27: 187 mL

## 2021-02-27 MED ORDER — LABETALOL HCL 5 MG/ML IV SOLN
INTRAVENOUS | Status: AC
  Filled 2021-02-27: qty 4

## 2021-02-27 MED ORDER — ASPIRIN 81 MG PO CHEW
81.0000 mg | CHEWABLE_TABLET | ORAL | Status: AC
  Administered 2021-02-27: 81 mg via ORAL

## 2021-02-27 MED ORDER — SODIUM CHLORIDE 0.9% FLUSH
3.0000 mL | Freq: Two times a day (BID) | INTRAVENOUS | Status: DC
  Administered 2021-02-28: 3 mL via INTRAVENOUS

## 2021-02-27 SURGICAL SUPPLY — 24 items
BALLN TREK RX 2.5X15 (BALLOONS) ×2
BALLOON TREK RX 2.5X15 (BALLOONS) ×1 IMPLANT
CATH 5FR JL3.5 JR4 ANG PIG MP (CATHETERS) ×2 IMPLANT
CATH INFINITI 5FR JL4 (CATHETERS) ×2 IMPLANT
CATH LAUNCHER 6FR JR4 (CATHETERS) ×2 IMPLANT
DEVICE CLOSURE MYNXGRIP 6/7F (Vascular Products) ×2 IMPLANT
DEVICE RAD TR BAND REGULAR (VASCULAR PRODUCTS) ×2 IMPLANT
DRAPE BRACHIAL (DRAPES) ×2 IMPLANT
GLIDESHEATH SLEND SS 6F .021 (SHEATH) ×2 IMPLANT
GUIDEWIRE INQWIRE 1.5J.035X260 (WIRE) ×1 IMPLANT
INQWIRE 1.5J .035X260CM (WIRE) ×2
KIT ENCORE 26 ADVANTAGE (KITS) ×2 IMPLANT
NEEDLE PERC 18GX7CM (NEEDLE) ×2 IMPLANT
PACK CARDIAC CATH (CUSTOM PROCEDURE TRAY) ×2 IMPLANT
PROTECTION STATION PRESSURIZED (MISCELLANEOUS) ×2
SET ATX SIMPLICITY (MISCELLANEOUS) ×2 IMPLANT
SHEATH AVANTI 5FR X 11CM (SHEATH) ×2 IMPLANT
SHEATH AVANTI 6FR X 11CM (SHEATH) ×2 IMPLANT
STATION PROTECTION PRESSURIZED (MISCELLANEOUS) ×1 IMPLANT
STENT ONYX FRONTIER 3.0X12 (Permanent Stent) ×2 IMPLANT
STENT ONYX FRONTIER 3.0X22 (Permanent Stent) ×2 IMPLANT
TUBING CIL FLEX 10 FLL-RA (TUBING) ×2 IMPLANT
WIRE G HI TQ BMW 190 (WIRE) ×2 IMPLANT
WIRE GUIDERIGHT .035X150 (WIRE) ×2 IMPLANT

## 2021-02-27 NOTE — Progress Notes (Signed)
ANTICOAGULATION CONSULT NOTE  Pharmacy Consult for heparin Indication: chest pain/ACS  Patient Measurements: Height: 5' 9"  (175.3 cm) Weight: 88.5 kg (195 lb) IBW/kg (Calculated) : 70.7  Vital Signs: BP: 166/86 (12/02 0200) Pulse Rate: 59 (12/02 0200)  Labs: Recent Labs    02/26/21 1450 02/26/21 1622 02/26/21 1714 02/27/21 0158  HGB 15.4  --   --  15.7  HCT 44.7  --   --  45.7  PLT 206  --   --  229  APTT  --   --  29  --   HEPARINUNFRC  --   --   --  0.68  CREATININE 0.72  --   --  0.79  TROPONINIHS 1,839* 1,758*  --   --      Estimated Creatinine Clearance: 85.1 mL/min (by C-G formula based on SCr of 0.79 mg/dL).   Medical History: Past Medical History:  Diagnosis Date   Anxiety    Bradycardia    GERD (gastroesophageal reflux disease)    History of kidney stones    Hyperlipidemia    Hypertension    Kidney stone    Neuropathy     Assessment: 49 YOM w/ PMH of HTN, HLD  complete heart block s/p pacemaker presents with chest pain and elevated troponins and is being started on a heparin infusion. According to a medication dispense history report he is on no chronic anticoagulation prior to admission  Goal of Therapy:  Heparin level 0.3-0.7 units/ml Monitor platelets by anticoagulation protocol: Yes  12/02  0158 HL 0.68, therapeutic  Plan:  Continue heparin infusion at 1100 units/hr Recheck HL in 8 hours to confirm CBC daily while on heparin  Renda Rolls, PharmD, [facility] 02/27/2021 2:56 AM

## 2021-02-27 NOTE — Assessment & Plan Note (Signed)
Continue PPI ?

## 2021-02-27 NOTE — Assessment & Plan Note (Signed)
Continue simvastatin. 

## 2021-02-27 NOTE — Assessment & Plan Note (Addendum)
Continue home Aricept. Delirium precautions

## 2021-02-27 NOTE — Hospital Course (Signed)
Justin Warren is a 75 y.o. male with medical history significant for complete heart block s/p dual-lead cardiac device placement, bradycardia, PAD, hyperlipidemia, hypertension, bilateral foot drop, depression GERD, memory loss, who presented to the ED on 02/26/21 with 2 days of chest pressure/pain, radiating to the neck and both arms.  In the ED, BP was elevated 162/86, HR 59, otherwise vitals stable.  Labs notable for hs-troponin elevated at 1839>>1758.   EKG was AV dual-paced at 62 bpm.    Admitted to [facility] service with cardiology consulted. Started on heparin drip with plan for cardiac cath on morning of 12/2.

## 2021-02-27 NOTE — ED Notes (Signed)
Pt resting in bed with no complaints, aware he is going to cath lab, family at bedside, bed locked and low, call light in reach, consent signed, pt alert and oriented. NAD.

## 2021-02-27 NOTE — Progress Notes (Incomplete)
[facility] Cardiology    SUBJECTIVE: ***   Vitals:   02/27/21 0300 02/27/21 0400 02/27/21 0530 02/27/21 0700  BP: (!) 170/85 (!) 160/98 (!) 157/77 (!) 158/87  Pulse: (!) 59 75 68 (!) 59  Resp: (!) 22 16 16 18   Temp:      TempSrc:      SpO2: 96% 95% 93% 95%  Weight:      Height:        No intake or output data in the 24 hours ending 02/27/21 0725    PHYSICAL EXAM  General: Well developed, well nourished, in no acute distress HEENT:  Normocephalic and atramatic Neck:  No JVD.  Lungs: Clear bilaterally to auscultation and percussion. Heart: HRRR . Normal S1 and S2 without gallops or murmurs.  Abdomen: Bowel sounds are positive, abdomen soft and non-tender  Msk:  Back normal, normal gait. Normal strength and tone for age. Extremities: No clubbing, cyanosis or edema.   Neuro: Alert and oriented X 3. Psych:  Good affect, responds appropriately   LABS: Basic Metabolic Panel: Recent Labs    02/26/21 1450 02/27/21 0158  NA 132* 137  K 3.4* 3.7  CL 101 101  CO2 24 30  GLUCOSE 114* 98  BUN 16 13  CREATININE 0.72 0.79  CALCIUM 9.0 9.4   Liver Function Tests: No results for input(s): AST, ALT, ALKPHOS, BILITOT, PROT, ALBUMIN in the last 72 hours. No results for input(s): LIPASE, AMYLASE in the last 72 hours. CBC: Recent Labs    02/26/21 1450 02/27/21 0158  WBC 10.6* 9.9  HGB 15.4 15.7  HCT 44.7 45.7  MCV 82.8 83.9  PLT 206 229   Cardiac Enzymes: No results for input(s): CKTOTAL, CKMB, CKMBINDEX, TROPONINI in the last 72 hours. BNP: Invalid input(s): POCBNP D-Dimer: No results for input(s): DDIMER in the last 72 hours. Hemoglobin A1C: No results for input(s): HGBA1C in the last 72 hours. Fasting Lipid Panel: Recent Labs    02/27/21 0158  CHOL 155  HDL 45  LDLCALC 83  TRIG 135  CHOLHDL 3.4   Thyroid Function Tests: No results for input(s): TSH, T4TOTAL, T3FREE, THYROIDAB in the last 72 hours.  Invalid input(s): FREET3 Anemia Panel: No results for  input(s): VITAMINB12, FOLATE, FERRITIN, TIBC, IRON, RETICCTPCT in the last 72 hours.  DG Chest 2 View  Result Date: 02/26/2021 CLINICAL DATA:  Chest pain EXAM: CHEST - 2 VIEW COMPARISON:  04/20/2018 FINDINGS: Cardiomegaly with left chest multi lead pacer. Both lungs are clear. Disc degenerative disease of the thoracic spine. IMPRESSION: Cardiomegaly without acute abnormality of the lungs. Electronically Signed   By: Delanna Ahmadi M.D.   On: 02/26/2021 15:19     Echo ***  TELEMETRY: ***:  ASSESSMENT AND PLAN:  Principal Problem:   NSTEMI (non-ST elevated myocardial infarction) ([place]) Active Problems:   Anxiety   Chronic GERD   Clinical depression   Hypercholesteremia   CHB (complete heart block) ([facility])   PAD (peripheral artery disease) ([place])   Loss of memory    1. ***   Justin Kida, MD 02/27/2021 7:25 AM

## 2021-02-27 NOTE — Consult Note (Signed)
CARDIOLOGY CONSULT NOTE               Patient ID: Justin Warren MRN: [id number] DOB/AGE: July 21, 1945 75 y.o.  Admit date: 02/26/2021 Referring Physician Dr. Nicole Kindred hospitalist Primary Physician [facility] Primary Cardiologist Jordan Hawks Reason for Consultation non-STEMI  HPI: Patient is a 75 year old male history of complete heart block status post permanent pacemaker peripheral vascular disease hyperlipidemia hypertension foot drop GERD memory loss presents with multiple episodes of chest is comfort pressure shortness of breath diaphoresis he had an episode several weeks ago took Tylenol and went to bed he complained of pressure in his chest neck and arms persistent dietary pain went away but this time the pain did not go away so he called his primary physician's office who told him to come to the emergency room.  EKG was nondiagnostic because it was paced but troponins came back positive he got some relief with nitroglycerin as well.  Blood pressure was elevated.  Patient was placed on heparin and nitroglycerin and cardiology consultation was given recommended  Review of systems complete and found to be negative unless listed above     Past Medical History:  Diagnosis Date   Anxiety    Bradycardia    GERD (gastroesophageal reflux disease)    History of kidney stones    Hyperlipidemia    Hypertension    Kidney stone    Neuropathy     Past Surgical History:  Procedure Laterality Date   APPENDECTOMY  1994   BACK SURGERY     CARPAL TUNNEL RELEASE Right 2012   CARPAL TUNNEL RELEASE Left 02/06/2020   Procedure: CARPAL TUNNEL RELEASE;  Surgeon: Earnestine Leys, MD;  Location: [facility] ORS;  Service: Orthopedics;  Laterality: Left;  BLOCK   CATARACT EXTRACTION, BILATERAL  2019   COLONOSCOPY WITH PROPOFOL N/A 07/09/2020   Procedure: COLONOSCOPY WITH PROPOFOL;  Surgeon: Lin Landsman, MD;  Location: [facility] END[facility]COPY;  Service: Gastroenterology;  Laterality:  N/A;   CYSTOSCOPY W/ RETROGRADES Right 11/30/2019   Procedure: CYSTOSCOPY WITH RETROGRADE PYELOGRAM;  Surgeon: [name], MD;  Location: [facility] ORS;  Service: Urology;  Laterality: Right;   CYSTOSCOPY/URETEROSCOPY/HOLMIUM LASER/STENT PLACEMENT Right 11/30/2019   Procedure: CYSTOSCOPY/URETEROSCOPY/HOLMIUM LASER/STENT PLACEMENT;  Surgeon: [name], MD;  Location: [facility] ORS;  Service: Urology;  Laterality: Right;   ESOPHAGOGASTRODUODENOSCOPY (EGD) WITH PROPOFOL N/A 07/09/2020   Procedure: ESOPHAGOGASTRODUODENOSCOPY (EGD) WITH PROPOFOL;  Surgeon: Lin Landsman, MD;  Location: [facility] ENDOSCOPY;  Service: Gastroenterology;  Laterality: N/A;   EYE SURGERY     INSERT / REPLACE / REMOVE PACEMAKER     JOINT REPLACEMENT     LUMBAR LAMINECTOMIES  1976 & 1978   Dr. 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   SIGMOIDOSCOPY  2010   TRIGGER FINGER RELEASE Left 02/06/2020   Procedure: RELEASE TRIGGER FINGER/A-1 PULLEY;  Surgeon: Earnestine Leys, MD;  Location: [facility] ORS;  Service: Orthopedics;  Laterality: Left;    (Not in a hospital admission)  Social History   Socioeconomic History   Marital status: Married    Spouse name: Not on file   Number of children: 3   Years of education: Not on file   Highest education level: Bachelor's degree (e.g., BA, AB, BS)  Occupational History   Occupation: retired  Tobacco Use   Smoking status: Former  Packs/day: 2.00    Years: 30.00    Pack years: 60.00    Types: Cigarettes   Smokeless tobacco: Never   Tobacco comments:    quit in 1991-1992  Vaping Use   Vaping Use: Never used  Substance and Sexual Activity   Alcohol use: Not Currently    Alcohol/week: 0.0 standard drinks    Comment: quit over 30 years ago   Drug use: No   Sexual activity: Yes    Birth  control/protection: None  Other Topics Concern   Not on file  Social History Narrative   Not on file   Social Determinants of Health   Financial Resource Strain: Low Risk    Difficulty of Paying Living Expenses: Not hard at all  Food Insecurity: No Food Insecurity   Worried About Charity fundraiser in the Last Year: Never true   Ran Out of Food in the Last Year: Never true  Transportation Needs: No Transportation Needs   Lack of Transportation (Medical): No   Lack of Transportation (Non-Medical): No  Physical Activity: Inactive   Days of Exercise per Week: 0 days   Minutes of Exercise per Session: 0 min  Stress: No Stress Concern Present   Feeling of Stress : Only a little  Social Connections: Moderately Integrated   Frequency of Communication with Friends and Family: More than three times a week   Frequency of Social Gatherings with Friends and Family: More than three times a week   Attends Religious Services: More than 4 times per year   Active Member of [employer] or Organizations: No   Attends Music therapist: Never   Marital Status: Married  Human resources officer Violence: Not At Risk   Fear of Current or Ex-Partner: No   Emotionally Abused: No   Physically Abused: No   Sexually Abused: No    Family History  Problem Relation Age of Onset   Dementia Mother    GER disease Mother    Hypertension Father    Arrhythmia Sister    Heart murmur Sister       Review of systems complete and found to be negative unless listed above      PHYSICAL EXAM  General: Well developed, well nourished, in no acute distress HEENT:  Normocephalic and atramatic Neck:  No JVD.  Lungs: Clear bilaterally to auscultation and percussion. Heart: HRRR . Normal S1 and S2 without gallops or murmurs.  Abdomen: Bowel sounds are positive, abdomen soft and non-tender  Msk:  Back normal, normal gait. Normal strength and tone for age. Extremities: No clubbing, cyanosis or edema.   Neuro:  Alert and oriented X 3. Psych:  Good affect, responds appropriately  Labs:   Lab Results  Component Value Date   WBC 9.9 02/27/2021   HGB 15.7 02/27/2021   HCT 45.7 02/27/2021   MCV 83.9 02/27/2021   PLT 229 02/27/2021    Recent Labs  Lab 02/27/21 0158  NA 137  K 3.7  CL 101  CO2 30  BUN 13  CREATININE 0.79  CALCIUM 9.4  GLUCOSE 98   Lab Results  Component Value Date   TROPONINI <0.03 09/07/2017    Lab Results  Component Value Date   CHOL 155 02/27/2021   CHOL 163 11/03/2020   CHOL 171 04/16/2020   Lab Results  Component Value Date   HDL 45 02/27/2021   HDL 35 (L) 11/03/2020   HDL 39 (L) 04/16/2020   Lab Results  Component Value Date  LDLCALC 83 02/27/2021   LDLCALC 99 11/03/2020   LDLCALC 104 (H) 04/16/2020   Lab Results  Component Value Date   TRIG 135 02/27/2021   TRIG 167 (H) 11/03/2020   TRIG 159 (H) 04/16/2020   Lab Results  Component Value Date   CHOLHDL 3.4 02/27/2021   CHOLHDL 4.7 11/03/2020   CHOLHDL 4.4 04/16/2020   No results found for: LDLDIRECT    Radiology: DG Chest 2 View  Result Date: 02/26/2021 CLINICAL DATA:  Chest pain EXAM: CHEST - 2 VIEW COMPARISON:  04/20/2018 FINDINGS: Cardiomegaly with left chest multi lead pacer. Both lungs are clear. Disc degenerative disease of the thoracic spine. IMPRESSION: Cardiomegaly without acute abnormality of the lungs. Electronically Signed   By: Delanna Ahmadi M.D.   On: 02/26/2021 15:19    EKG: Paced rhythm at 70 AV  ASSESSMENT AND PLAN:  Non-STEMI Complete heart block with bradycardia Status post permanent pacemaker Peripheral vascular disease Anxiety GERD Hyperlipidemia Borderline obesity . Plan Continue to admit follow-up troponins EKGs Heparin intravenously for anticoagulation for ACS Echocardiogram for left ventricular function wall motion valvular structure Continue beta-blockade therapy with metoprolol Consider adding ACE or ARB post non-STEMI Agree with omeprazole therapy  for reflux type symptoms Maintain simvastatin therapy for statin management may consider advancing to Lipitor or Crestor Proceed with cardiac cath in the morning for evaluation of coronary anatomy status post non-STEMI   Signed: Yolonda Kida MD 02/27/2021, 6:34 AM

## 2021-02-27 NOTE — Assessment & Plan Note (Signed)
Continue home Prozac

## 2021-02-27 NOTE — Assessment & Plan Note (Signed)
Presented with chest pressure/pain x 2 days. Troponin significantly elevated on presentation.   Started on heparin drip. Taken to cath lab on 12/2 - 2 stents placed to mid and proximal RCA.  Treated post-cath with Angiomax. --Cardiology following --ASA/Plavix x 12 months --Consider addition of ACEI/ARB --Continue statin --Monitor closely

## 2021-02-27 NOTE — Assessment & Plan Note (Signed)
Cardiology following.  Telemetry.

## 2021-02-27 NOTE — Assessment & Plan Note (Signed)
With pacemaker in place. Telemetry monitoring.

## 2021-02-27 NOTE — Assessment & Plan Note (Signed)
No acute issues.  Monitor. On ASA/Plavix.

## 2021-02-27 NOTE — Assessment & Plan Note (Signed)
S/p dual-chamber pacemaker placement. Cardiology following.

## 2021-02-27 NOTE — Assessment & Plan Note (Signed)
Continue home Buspar

## 2021-02-27 NOTE — Progress Notes (Signed)
  Progress Note    Justin Warren   AJO:[id number]  DOB: 1945-04-20  DOA: 02/26/2021     0 Date of Service: 02/27/2021   Brief Narrative: Justin Warren is a 75 y.o. male with medical history significant for complete heart block s/p dual-lead cardiac device placement, bradycardia, PAD, hyperlipidemia, hypertension, bilateral foot drop, depression GERD, memory loss, who presented to the ED on 02/26/21 with 2 days of chest pressure/pain, radiating to the neck and both arms.  In the ED, BP was elevated 162/86, HR 59, otherwise vitals stable.  Labs notable for hs-troponin elevated at 1839>>1758.   EKG was AV dual-paced at 62 bpm.    Admitted to [facility] service with cardiology consulted. Started on heparin drip with plan for cardiac cath on morning of 12/2.       Assessment and Plan * NSTEMI (non-ST elevated myocardial infarction) ([name]) Presented with chest pressure/pain x 2 days. Troponin significantly elevated on presentation.   Started on heparin drip. Taken to cath lab on 12/2 - 2 stents placed to mid and proximal RCA.  Treated post-cath with Angiomax. --Cardiology following --ASA/Plavix x 12 months --Consider addition of ACEI/ARB --Continue statin --Monitor closely  Bradycardia With pacemaker in place. Telemetry monitoring.  CHB (complete heart block) (HCC) S/p dual-chamber pacemaker placement. Cardiology following.  Status post placement of cardiac pacemaker Cardiology following.  Telemetry.  PAD (peripheral artery disease) (HCC) No acute issues.  Monitor. On ASA/Plavix.  Hypercholesteremia Continue simvastatin  Clinical depression Continue home Prozac  Anxiety Continue home Buspar  Chronic GERD Continue PPI  Loss of memory Continue home Aricept. Delirium precautions     Subjective:  Pt seen in cath recovery this afternoon.  Reports feeling okay.  Had a twinge of chest pain a short while ago that resolved, has not recurred.  No CP or SOB or other acute  complaints at this time.  Reported recently getting over a bout of bronchitis, cough had improved.  Objective Vitals:   02/27/21 1400 02/27/21 1430 02/27/21 1500 02/27/21 1547  BP: (!) 151/69 137/80 138/79 (!) 162/94  Pulse: 64 65 65 69  Resp: 13 16 15 17   Temp:    98 F (36.7 C)  TempSrc:      SpO2: 97% 97% 97% 98%  Weight:      Height:       88.5 kg  Vital signs were reviewed and unremarkable except for: Blood pressure: Mildly elevated    Exam General exam: Drowsy but awake and remains engaged during encounter, no acute distress HEENT: moist mucus membranes, hearing grossly normal  Respiratory system: CTAB, no wheezes, rales or rhonchi, normal respiratory effort. Cardiovascular system: normal S1/S2, RRR, no peripheral edema.   Gastrointestinal system: soft, nontender abdomen Central nervous system: A&O x3. no gross focal neurologic deficits, normal speech Skin: dry, intact, normal temperature, tattoos on chest and upper extremity seen Psychiatry: normal mood, congruent affect, judgement and insight appear normal    Labs / Other Information Labs and other tests reviewed and unremarkable   Disposition Plan: Status is: Inpatient  Remains inpatient appropriate because: On IV therapies and requires close monitoring status postcardiac cath with 2 stents placed today.  Discharge pending clearance by cardiology.        Time spent: 30 minutes [facility] 02/27/2021, 5:11 PM

## 2021-02-28 LAB — CBC
HCT: 40.3 % (ref 39.0–52.0)
Hemoglobin: 13.6 g/dL (ref 13.0–17.0)
MCH: 28 pg (ref 26.0–34.0)
MCHC: 33.7 g/dL (ref 30.0–36.0)
MCV: 83.1 fL (ref 80.0–100.0)
Platelets: 174 10*3/uL (ref 150–400)
RBC: 4.85 MIL/uL (ref 4.22–5.81)
RDW: 13.5 % (ref 11.5–15.5)
WBC: 7 10*3/uL (ref 4.0–10.5)
nRBC: 0 % (ref 0.0–0.2)

## 2021-02-28 LAB — BASIC METABOLIC PANEL
Anion gap: 9 (ref 5–15)
BUN: 14 mg/dL (ref 8–23)
CO2: 25 mmol/L (ref 22–32)
Calcium: 8.7 mg/dL — ABNORMAL LOW (ref 8.9–10.3)
Chloride: 102 mmol/L (ref 98–111)
Creatinine, Ser: 0.73 mg/dL (ref 0.61–1.24)
GFR, Estimated: >60 mL/min (ref >60)
Glucose, Bld: 101 mg/dL — ABNORMAL HIGH (ref 70–99)
Potassium: 3.5 mmol/L (ref 3.5–5.1)
Sodium: 136 mmol/L (ref 135–145)

## 2021-02-28 MED ORDER — POTASSIUM CHLORIDE CRYS ER 20 MEQ PO TBCR
40.0000 meq | EXTENDED_RELEASE_TABLET | Freq: Once | ORAL | Status: AC
  Administered 2021-02-28: 40 meq via ORAL
  Filled 2021-02-28: qty 2

## 2021-02-28 MED ORDER — LOSARTAN POTASSIUM 50 MG PO TABS
50.0000 mg | ORAL_TABLET | Freq: Every day | ORAL | 2 refills | Status: DC
Start: 2021-02-28 — End: 2021-07-07

## 2021-02-28 MED ORDER — ALUM & MAG HYDROXIDE-SIMETH 200-200-20 MG/5ML PO SUSP: 30.0000 mL | Freq: Four times a day (QID) | ORAL | Status: DC | PRN

## 2021-02-28 MED ORDER — ATORVASTATIN CALCIUM 80 MG PO TABS
80.0000 mg | ORAL_TABLET | Freq: Every day | ORAL | Status: DC
  Administered 2021-02-28: 80 mg via ORAL
  Filled 2021-02-28: qty 1

## 2021-02-28 MED ORDER — ATORVASTATIN CALCIUM 80 MG PO TABS
80.0000 mg | ORAL_TABLET | Freq: Every day | ORAL | 2 refills | Status: DC
Start: 2021-02-28 — End: 2021-07-07

## 2021-02-28 MED ORDER — ASPIRIN 81 MG PO CHEW: 81.0000 mg | CHEWABLE_TABLET | Freq: Every day | ORAL | 2 refills | Status: DC

## 2021-02-28 MED ORDER — CLOPIDOGREL BISULFATE 75 MG PO TABS: 75.0000 mg | ORAL_TABLET | Freq: Every day | ORAL | 2 refills | Status: DC

## 2021-02-28 MED ORDER — DICYCLOMINE HCL 20 MG PO TABS: 20.0000 mg | ORAL_TABLET | Freq: Three times a day (TID) | ORAL | 0 refills | Status: DC

## 2021-02-28 MED ORDER — LOSARTAN POTASSIUM 50 MG PO TABS
50.0000 mg | ORAL_TABLET | Freq: Every day | ORAL | Status: DC
  Administered 2021-02-28: 50 mg via ORAL
  Filled 2021-02-28: qty 1

## 2021-02-28 NOTE — Discharge Summary (Signed)
Physician Discharge Summary   Patient name: Justin Warren  Admit date:     02/26/2021  Discharge date: 02/28/2021  Discharge Physician: Ezekiel Slocumb   PCP: Margo Common, PA-C (Inactive)   Recommendations at discharge:   Follow up with PCP in 1-2 weeks. Follow up with Cardiology.    Discharge Diagnoses Principal Problem:   NSTEMI (non-ST elevated myocardial infarction) ([place]) Active Problems:   CHB (complete heart block) (HCC)   Bradycardia   Status post placement of cardiac pacemaker   PAD (peripheral artery disease) (HCC)   Hypercholesteremia   Anxiety   Clinical depression   Chronic GERD   Loss of memory      Hospital Course   Justin Warren is a 75 y.o. male with medical history significant for complete heart block s/p dual-lead cardiac device placement, bradycardia, PAD, hyperlipidemia, hypertension, bilateral foot drop, depression GERD, memory loss, who presented to the ED on 02/26/21 with 2 days of chest pressure/pain, radiating to the neck and both arms.  In the ED, BP was elevated 162/86, HR 59, otherwise vitals stable.  Labs notable for hs-troponin elevated at 1839>>1758.   EKG was AV dual-paced at 62 bpm.    Admitted to [facility] service with cardiology consulted. Started on heparin drip with plan for cardiac cath on morning of 12/2.        * NSTEMI (non-ST elevated myocardial infarction) ([place]) Presented with chest pressure/pain x 2 days. Troponin significantly elevated on presentation.   Started on heparin drip. Taken to cath lab on 12/2 - 2 stents placed to mid and proximal RCA.  Treated post-cath with Angiomax. --Cardiology following --ASA/Plavix x 12 months --Consider addition of ACEI/ARB --Continue statin --Monitor closely --Outpatient Cardiology Follow up   Bradycardia With pacemaker in place. Telemetry monitoring.   CHB (complete heart block) (HCC) S/p dual-chamber pacemaker placement. Cardiology following.   Status post placement  of cardiac pacemaker Cardiology following.  Monitored on Telemetry.   PAD (peripheral artery disease) (HCC) No acute issues.  Monitor. On ASA/Plavix.   Hypercholesteremia Continue simvastatin   Clinical depression Continue Prozac   Anxiety Continue Buspar   Chronic GERD Continue PPI   Loss of memory Continue home Aricept. Delirium precautions     Procedures performed: Cardiac cath   Condition at discharge: stable  Exam General exam: awake, alert, no acute distress HEENT: atraumatic, clear conjunctiva, anicteric sclera, moist mucus membranes, hearing grossly normal  Respiratory system: CTAB, no wheezes, rales or rhonchi, normal respiratory effort. Cardiovascular system: normal S1/S2, RRR, no JVD, murmurs, rubs, gallops, no pedal edema.   Central nervous system: A&O x4. no gross focal neurologic deficits, normal speech Extremities: moves all, no edema, normal tone   Disposition: Home  Discharge time: less than 30 minutes.   Allergies as of 02/28/2021       Reactions   Amoxicillin-pot Clavulanate Nausea And Vomiting   DID THE REACTION INVOLVE: Swelling of the face/tongue/throat, SOB, or low BP? No Sudden or severe rash/hives, skin peeling, or the inside of the mouth or nose? No Did it require medical treatment? No When did it last happen? Over 10 years ago If all above answers are "NO", may proceed with cephalosporin use.        Medication List     STOP taking these medications    simvastatin 40 MG tablet Commonly known as: ZOCOR       TAKE these medications    acetaminophen 650 MG CR tablet Commonly known as: TYLENOL Take  650 mg by mouth every 8 (eight) hours as needed for pain.   aspirin 81 MG chewable tablet Chew 1 tablet (81 mg total) by mouth daily. Start taking on: March 01, 2021   atorvastatin 80 MG tablet Commonly known as: LIPITOR Take 1 tablet (80 mg total) by mouth daily.   benzonatate 100 MG capsule Commonly known as:  TESSALON Take 100 mg by mouth 3 (three) times daily as needed for cough.   busPIRone 15 MG tablet Commonly known as: BUSPAR TAKE 1 TABLET TWICE DAILY   clopidogrel 75 MG tablet Commonly known as: PLAVIX Take 1 tablet (75 mg total) by mouth daily with breakfast. Start taking on: March 01, 2021   dicyclomine 20 MG tablet Commonly known as: BENTYL Take 1 tablet (20 mg total) by mouth 3 (three) times daily before meals.   donepezil 5 MG tablet Commonly known as: ARICEPT Take 1 tablet (5 mg total) by mouth at bedtime.   FLUoxetine 20 MG capsule Commonly known as: PROZAC Take 1 capsule (20 mg total) by mouth daily.   losartan 50 MG tablet Commonly known as: COZAAR Take 1 tablet (50 mg total) by mouth daily.   meloxicam 15 MG tablet Commonly known as: MOBIC Take 1 tablet (15 mg total) by mouth daily. What changed:  when to take this reasons to take this   metoprolol succinate 25 MG 24 hr tablet Commonly known as: TOPROL-XL Take 25 mg by mouth daily.   omeprazole 40 MG capsule Commonly known as: PRILOSEC TAKE 1 CAPSULE (40 MG TOTAL) BY MOUTH DAILY.   tadalafil 5 MG tablet Commonly known as: CIALIS Take 1 tablet (5 mg total) by mouth daily as needed for erectile dysfunction.        DG Chest 2 View  Result Date: 02/26/2021 CLINICAL DATA:  Chest pain EXAM: CHEST - 2 VIEW COMPARISON:  04/20/2018 FINDINGS: Cardiomegaly with left chest multi lead pacer. Both lungs are clear. Disc degenerative disease of the thoracic spine. IMPRESSION: Cardiomegaly without acute abnormality of the lungs. Electronically Signed   By: Delanna Ahmadi M.D.   On: 02/26/2021 15:19   CARDIAC CATHETERIZATION  Result Date: 02/27/2021   Mid LM lesion is 25% stenosed.   Prox RCA lesion is 70% stenosed.   Mid RCA lesion is 90% stenosed.   Dist RCA lesion is 70% stenosed.   A drug-eluting stent was successfully placed using a STENT ONYX FRONTIER 3.0X12.   A drug-eluting stent was successfully placed  using a STENT ONYX FRONTIER 3.0X22.   Post intervention, there is a 0% residual stenosis.   Post intervention, there is a 0% residual stenosis.   The left ventricular systolic function is normal.   LV end diastolic pressure is mildly elevated.   The left ventricular ejection fraction is 50-55% by visual estimate. Conclusion Normal left ventricular function around 50 to 55% Coronaries 25% mid long left main nonobstructive LAD was large no significant obstructive disease Circumflex relatively large no significant obstructive disease RCA was very large with a proximal 70% lesion 95% mid 70% distal Mid lesion was IRA so we elected to intervene on that vessel. We deployed a 300 x22 mm Onyx frontier to 14 atm We direct stented the proximal vessel lesion with a 3 oh x12 mm Onyx frontier to 16 atm Patient was maintained on Angiomax for additional 4 hours at reduced rate Patient was placed on aspirin 81 and Plavix 75 hopefully for around 12 months Minx was deployed into the right femoral artery TR  band was placed on the right radial artery Anticipate discharge within 23 hours   Results for orders placed or performed during the hospital encounter of 02/26/21  Resp Panel by RT-PCR (Flu A&B, Covid) Nasopharyngeal Swab     Status: None   Collection Time: 02/26/21  5:14 PM   Specimen: Nasopharyngeal Swab; Nasopharyngeal(NP) swabs in vial transport medium  Result Value Ref Range Status   SARS Coronavirus 2 by RT PCR NEGATIVE NEGATIVE Final    Comment: (NOTE) SARS-CoV-2 target nucleic acids are NOT DETECTED.  The SARS-CoV-2 RNA is generally detectable in upper respiratory specimens during the acute phase of infection. The lowest concentration of SARS-CoV-2 viral copies this assay can detect is 138 copies/mL. A negative result does not preclude SARS-Cov-2 infection and should not be used as the sole basis for treatment or other patient management decisions. A negative result may occur with  improper specimen  collection/handling, submission of specimen other than nasopharyngeal swab, presence of viral mutation(s) within the areas targeted by this assay, and inadequate number of viral copies(<138 copies/mL). A negative result must be combined with clinical observations, patient history, and epidemiological information. The expected result is Negative.  Fact Sheet for Patients:  EntrepreneurPulse.com.au  Fact Sheet for Healthcare Providers:  IncredibleEmployment.be  This test is no t yet approved or cleared by the [place] FDA and  has been authorized for detection and/or diagnosis of SARS-CoV-2 by FDA under an Emergency Use Authorization (EUA). This EUA will remain  in effect (meaning this test can be used) for the duration of the COVID-19 declaration under Section 564(b)(1) of the Act, 21 U.S.C.section 360bbb-3(b)(1), unless the authorization is terminated  or revoked sooner.       Influenza A by PCR NEGATIVE NEGATIVE Final   Influenza B by PCR NEGATIVE NEGATIVE Final    Comment: (NOTE) The Xpert Xpress SARS-CoV-2/FLU/RSV plus assay is intended as an aid in the diagnosis of influenza from Nasopharyngeal swab specimens and should not be used as a sole basis for treatment. Nasal washings and aspirates are unacceptable for Xpert Xpress SARS-CoV-2/FLU/RSV testing.  Fact Sheet for Patients: EntrepreneurPulse.com.au  Fact Sheet for Healthcare Providers: IncredibleEmployment.be  This test is not yet approved or cleared by the [place] FDA and has been authorized for detection and/or diagnosis of SARS-CoV-2 by FDA under an Emergency Use Authorization (EUA). This EUA will remain in effect (meaning this test can be used) for the duration of the COVID-19 declaration under Section 564(b)(1) of the Act, 21 U.S.C. section 360bbb-3(b)(1), unless the authorization is terminated or revoked.  Performed at [facility], [address] [place], [place] [address]     Signed:  Ezekiel Slocumb MD.  [facility] 02/28/2021, 11:24 AM

## 2021-02-28 NOTE — Progress Notes (Signed)
[facility] Cardiology    SUBJECTIVE: Patient states he feels well no wrist or groin issues no chest pain ambulating in the hall well and is ready to go home   Vitals:   02/27/21 1953 02/27/21 2359 02/28/21 0428 02/28/21 0429  BP: (!) 148/90 (!) 142/71 (!) 145/117 (!) 145/117  Pulse: 66 64 67 68  Resp: _0 Temp: 98.2 F (36.8 C) 98.2 F (36.8 C) 98.1 F (36.7 C) 98.1 F (36.7 C)  TempSrc:      SpO2: 99% 96%  96%  Weight:      Height:         Intake/Output Summary (Last 24 hours) at 02/28/2021 0747 Last data filed at 02/27/2021 1731 Gross per 24 hour  Intake 364.32 ml  Output 1625 ml  Net -1260.68 ml      PHYSICAL EXAM  General: Well developed, well nourished, in no acute distress HEENT:  Normocephalic and atramatic Neck:  No JVD.  Lungs: Clear bilaterally to auscultation and percussion. Heart: HRRR . Normal S1 and S2 without gallops or murmurs.  Abdomen: Bowel sounds are positive, abdomen soft and non-tender  Msk:  Back normal, normal gait. Normal strength and tone for age. Extremities: No clubbing, cyanosis or edema.   Neuro: Alert and oriented X 3. Psych:  Good affect, responds appropriately   LABS: Basic Metabolic Panel: Recent Labs    02/27/21 0158 02/28/21 0524  NA 137 136  K 3.7 3.5  CL 101 102  CO2 30 25  GLUCOSE 98 101*  BUN 13 14  CREATININE 0.79 0.73  CALCIUM 9.4 8.7*   Liver Function Tests: No results for input(s): AST, ALT, ALKPHOS, BILITOT, PROT, ALBUMIN in the last 72 hours. No results for input(s): LIPASE, AMYLASE in the last 72 hours. CBC: Recent Labs    02/27/21 0158 02/28/21 0524  WBC 9.9 7.0  HGB 15.7 13.6  HCT 45.7 40.3  MCV 83.9 83.1  PLT 229 174   Cardiac Enzymes: No results for input(s): CKTOTAL, CKMB, CKMBINDEX, TROPONINI in the last 72 hours. BNP: Invalid input(s): POCBNP D-Dimer: No results for input(s): DDIMER in the last 72 hours. Hemoglobin A1C: No results for input(s): HGBA1C in the last 72 hours. Fasting  Lipid Panel: Recent Labs    02/27/21 0158  CHOL 155  HDL 45  LDLCALC 83  TRIG 135  CHOLHDL 3.4   Thyroid Function Tests: No results for input(s): TSH, T4TOTAL, T3FREE, THYROIDAB in the last 72 hours.  Invalid input(s): FREET3 Anemia Panel: No results for input(s): VITAMINB12, FOLATE, FERRITIN, TIBC, IRON, RETICCTPCT in the last 72 hours.  DG Chest 2 View  Result Date: 02/26/2021 CLINICAL DATA:  Chest pain EXAM: CHEST - 2 VIEW COMPARISON:  04/20/2018 FINDINGS: Cardiomegaly with left chest multi lead pacer. Both lungs are clear. Disc degenerative disease of the thoracic spine. IMPRESSION: Cardiomegaly without acute abnormality of the lungs. Electronically Signed   By: Delanna Ahmadi M.D.   On: 02/26/2021 15:19   CARDIAC CATHETERIZATION  Result Date: 02/27/2021   Mid LM lesion is 25% stenosed.   Prox RCA lesion is 70% stenosed.   Mid RCA lesion is 90% stenosed.   Dist RCA lesion is 70% stenosed.   A drug-eluting stent was successfully placed using a STENT ONYX FRONTIER 3.0X12.   A drug-eluting stent was successfully placed using a STENT ONYX FRONTIER 3.0X22.   Post intervention, there is a 0% residual stenosis.   Post intervention, there is a 0% residual stenosis.   The  left ventricular systolic function is normal.   LV end diastolic pressure is mildly elevated.   The left ventricular ejection fraction is 50-55% by visual estimate. Conclusion Normal left ventricular function around 50 to 55% Coronaries 25% mid long left main nonobstructive LAD was large no significant obstructive disease Circumflex relatively large no significant obstructive disease RCA was very large with a proximal 70% lesion 95% mid 70% distal Mid lesion was IRA so we elected to intervene on that vessel. We deployed a 300 x22 mm Onyx frontier to 14 atm We direct stented the proximal vessel lesion with a 3 oh x12 mm Onyx frontier to 16 atm Patient was maintained on Angiomax for additional 4 hours at reduced rate Patient was  placed on aspirin 81 and Plavix 75 hopefully for around 12 months Minx was deployed into the right femoral artery TR band was placed on the right radial artery Anticipate discharge within 23 hours     Echo pending we will get on discharge  TELEMETRY: Paced rhythm at 70:  ASSESSMENT AND PLAN:  Principal Problem:   NSTEMI (non-ST elevated myocardial infarction) ([place]) Active Problems:   Anxiety   Chronic GERD   Clinical depression   Hypercholesteremia   CHB (complete heart block) (HCC)   PAD (peripheral artery disease) (HCC)   Loss of memory   Bradycardia   Status post placement of cardiac pacemaker    Plan S/P NSTEMI with elevated troponin S/P PCI stent to RCA with DES x 2 Patient states to be doing well, no chest pain Recommend aspirin 81 mg daily and Plavix 75 mg daily for at least 12 months Beta-blockade therapy add ARB continue statin therapy Recommend switching simvastatin to Lipitor 80 mg daily for lipid management Refer the patient to cardiac rehab Dissipate discharge today Follow-up cardiology 1 to 2 weeks Follow-up with Ubal[facility] Glassing or [facility] 1-2 weeks  Yolonda Kida, MD, 02/28/2021 7:47 AM

## 2021-03-02 ENCOUNTER — Inpatient Hospital Stay: Payer: Medicare HMO | Admitting: Nurse Practitioner

## 2021-03-02 ENCOUNTER — Encounter: Payer: Self-pay | Admitting: Internal Medicine

## 2021-03-02 ENCOUNTER — Telehealth: Payer: Self-pay

## 2021-03-02 NOTE — Telephone Encounter (Signed)
Transition Care Management Follow-up Telephone Call Date of discharge and from where: [facility] 02/28/21    How have you been since you were released from the hospital? [place] Any questions or concerns? No  Items Reviewed: Did the pt receive and understand the discharge instructions provided? Yes  Medications obtained and verified? Yes  Other? No  Any new allergies since your discharge? No  Dietary orders reviewed? No Do you have support at home? Yes   Home Care and Equipment/Supplies: Were home health services ordered? no  Functional Questionnaire: (I = Independent and D = Dependent) ADLs: I  Bathing/Dressing- I  Meal Prep- I  Eating- I  Maintaining continence- I  Transferring/Ambulation- I  Managing Meds- I  Follow up appointments reviewed:  PCP Hospital f/u appt confirmed? Yes  Scheduled to see Dr.Orville on today @ 3:40. [facility] f/u appt confirmed? Yes  Scheduled to see cardiologist on Thursday. Are transportation arrangements needed? No  If their condition worsens, is the pt aware to call PCP or go to the Emergency Dept.? Yes Was the patient provided with contact information for the PCP's office or ED? Yes Was to pt encouraged to call back with questions or concerns? Yes

## 2021-03-05 DIAGNOSIS — I214 Non-ST elevation (NSTEMI) myocardial infarction: Secondary | ICD-10-CM | POA: Diagnosis not present

## 2021-03-05 DIAGNOSIS — I442 Atrioventricular block, complete: Secondary | ICD-10-CM | POA: Diagnosis not present

## 2021-03-09 ENCOUNTER — Other Ambulatory Visit: Payer: Self-pay

## 2021-03-09 ENCOUNTER — Ambulatory Visit (INDEPENDENT_AMBULATORY_CARE_PROVIDER_SITE_OTHER): Payer: Medicare HMO | Admitting: Family Medicine

## 2021-03-09 ENCOUNTER — Encounter: Payer: Self-pay | Admitting: Family Medicine

## 2021-03-09 VITALS — BP 149/82 | HR 78 | Temp 98.3°F | Resp 18 | Ht 69.0 in | Wt 201.0 lb

## 2021-03-09 DIAGNOSIS — I214 Non-ST elevation (NSTEMI) myocardial infarction: Secondary | ICD-10-CM

## 2021-03-09 NOTE — Progress Notes (Signed)
   SUBJECTIVE:   CHIEF COMPLAINT / HPI:   HOSPITAL FOLLOW UP Hospital/facility: [facility] 12/1-12/3 Diagnosis: NSTEMI Procedures/tests:  - LHC 12/2 - DES x2 to RCA Consultants: Cardiology New medications:  - started ASA, plavix, losartan. - stopped aricept, meloxicam - changed omeprazole to pantoprazole - changed simvastatin to atorvastatin.  Discharge instructions:   Status: better - no pain, SOB, LE swelling - reports good compliance - saw Dr. Corky Sox 12/8, cardiology. Referred to cardiac rehab.  - ambulates with cane, able to walk ok without assistance at home. No trouble with ADLs.  OBJECTIVE:   BP (!) 149/82 (BP Location: Right Arm, Patient Position: Sitting, Cuff Size: Large)   Pulse 78   Temp 98.3 F (36.8 C) (Temporal)   Resp 18   Ht 5' 9"  (1.753 m)   Wt 201 lb (91.2 kg)   SpO2 97%   BMI 29.68 kg/m   Gen: well appearing, in NAD Card: RRR, paced rhythm. Lungs: CTAB Ext: WWP, no edema  ASSESSMENT/PLAN:   NSTEMI (non-ST elevated myocardial infarction) [facility]) Doing well s/p DES placement. BP slightly high today and on recheck, continue to monitor at home and f/u in 4 weeks for recheck with log. Continue current meds. Continue to follow with Cardiology.      Myles Gip, DO

## 2021-03-09 NOTE — Assessment & Plan Note (Addendum)
Doing well s/p DES placement. BP slightly high today and on recheck, continue to monitor at home and f/u in 4 weeks for recheck with log. Continue current meds. Continue to follow with Cardiology.

## 2021-03-09 NOTE — Patient Instructions (Signed)
It was great to see you!  Our plans for today:  - Keep an eye on your blood pressure and write down these measurements and bring with you to follow up. - Come back in 4 weeks.  Take care and seek immediate care sooner if you develop any concerns.   Dr. Ky Barban

## 2021-03-10 ENCOUNTER — Ambulatory Visit: Payer: Self-pay

## 2021-03-10 DIAGNOSIS — R051 Acute cough: Secondary | ICD-10-CM | POA: Diagnosis not present

## 2021-03-10 DIAGNOSIS — J00 Acute nasopharyngitis [common cold]: Secondary | ICD-10-CM | POA: Diagnosis not present

## 2021-03-10 NOTE — Telephone Encounter (Signed)
  Chief Complaint: Non-productive cough Symptoms: Cough, runny nose Frequency: Started yesterday Pertinent Negatives: Patient denies No fever or shortness of breath Disposition: [] ED /[] Urgent Care (no appt availability in office) / [] Appointment(In office/virtual)/ []  [place] [facility]/ [] Home Care/ [] Refused Recommended Disposition  Additional Notes: Pt. States he coughed all night. Non-productive. Has runny nose. Taking Coricidin and using Flonase without relief. No availability for virtual visit. Asking for cough medication and a decongestant. Please advise pt.     Answer Assessment - Initial Assessment Questions 1. ONSET: "When did the cough begin?"      Yesterday 2. SEVERITY: "How bad is the cough today?"      Moderate 3. SPUTUM: "Describe the color of your sputum" (none, dry cough; clear, white, yellow, green)     None 4. HEMOPTYSIS: "Are you coughing up any blood?" If so ask: "How much?" (flecks, streaks, tablespoons, etc.)     No 5. DIFFICULTY BREATHING: "Are you having difficulty breathing?" If Yes, ask: "How bad is it?" (e.g., mild, moderate, severe)    - MILD: No SOB at rest, mild SOB with walking, speaks normally in sentences, can lie down, no retractions, pulse < 100.    - MODERATE: SOB at rest, SOB with minimal exertion and prefers to sit, cannot lie down flat, speaks in phrases, mild retractions, audible wheezing, pulse 100-120.    - SEVERE: Very SOB at rest, speaks in single words, struggling to breathe, sitting hunched forward, retractions, pulse > 120      No 6. FEVER: "Do you have a fever?" If Yes, ask: "What is your temperature, how was it measured, and when did it start?"     No 7. CARDIAC HISTORY: "Do you have any history of heart disease?" (e.g., heart attack, congestive heart failure)      Heart attack 8. LUNG HISTORY: "Do you have any history of lung disease?"  (e.g., pulmonary embolus, asthma, emphysema)     No 9. PE RISK FACTORS: "Do you have a history  of blood clots?" (or: recent major surgery, recent prolonged travel, bedridden)      10. OTHER SYMPTOMS: "Do you have any other symptoms?" (e.g., runny nose, wheezing, chest pain)       Runny nose 11. PREGNANCY: "Is there any chance you are pregnant?" "When was your last menstrual period?"       N/a 12. TRAVEL: "Have you traveled out of the country in the last month?" (e.g., travel history, exposures)       No  Protocols used: Cough - Acute Non-Productive-A-AH

## 2021-03-10 NOTE — Telephone Encounter (Signed)
Contacted patient and advised who states that he was seen at [facility] and prescribed medicine this morning. KW

## 2021-03-12 ENCOUNTER — Encounter: Payer: Self-pay | Admitting: Family Medicine

## 2021-03-18 DIAGNOSIS — I442 Atrioventricular block, complete: Secondary | ICD-10-CM | POA: Diagnosis not present

## 2021-03-18 DIAGNOSIS — I480 Paroxysmal atrial fibrillation: Secondary | ICD-10-CM | POA: Insufficient documentation

## 2021-03-18 DIAGNOSIS — I214 Non-ST elevation (NSTEMI) myocardial infarction: Secondary | ICD-10-CM | POA: Diagnosis not present

## 2021-03-18 DIAGNOSIS — R001 Bradycardia, unspecified: Secondary | ICD-10-CM | POA: Diagnosis not present

## 2021-04-06 ENCOUNTER — Other Ambulatory Visit: Payer: Self-pay

## 2021-04-06 ENCOUNTER — Ambulatory Visit (INDEPENDENT_AMBULATORY_CARE_PROVIDER_SITE_OTHER): Payer: Medicare HMO | Admitting: Family Medicine

## 2021-04-06 VITALS — BP 137/84 | HR 60 | Temp 98.6°F | Resp 18 | Ht 69.0 in | Wt 199.0 lb

## 2021-04-06 DIAGNOSIS — I480 Paroxysmal atrial fibrillation: Secondary | ICD-10-CM

## 2021-04-06 DIAGNOSIS — I739 Peripheral vascular disease, unspecified: Secondary | ICD-10-CM

## 2021-04-06 DIAGNOSIS — Z950 Presence of cardiac pacemaker: Secondary | ICD-10-CM | POA: Diagnosis not present

## 2021-04-06 DIAGNOSIS — M21372 Foot drop, left foot: Secondary | ICD-10-CM | POA: Diagnosis not present

## 2021-04-06 DIAGNOSIS — M21371 Foot drop, right foot: Secondary | ICD-10-CM

## 2021-04-06 DIAGNOSIS — I10 Essential (primary) hypertension: Secondary | ICD-10-CM | POA: Insufficient documentation

## 2021-04-06 DIAGNOSIS — I214 Non-ST elevation (NSTEMI) myocardial infarction: Secondary | ICD-10-CM

## 2021-04-06 NOTE — Progress Notes (Signed)
° °  SUBJECTIVE:   CHIEF COMPLAINT / HPI:   Hypertension, CAD w/ h/o NSTEMI, PAF, HLD: - follows with Cardiology, last appt 12/21. Started eliquis at that time. - Medications: metoprolol, losartan, plavix, eliquis, atorvastatin - Compliance: good - Checking BP at home: yes, SBP 130-140s - still with some SOB with exertion. - denies chest pain, LE edema.   OBJECTIVE:   BP 137/84 (BP Location: Right Arm, Patient Position: Sitting, Cuff Size: Large)    Pulse 60    Temp 98.6 F (37 C) (Temporal)    Resp 18    Ht 5' 9"  (1.753 m)    Wt 199 lb (90.3 kg)    SpO2 96%    BMI 29.39 kg/m   Gen: well appearing, in NAD Card: RRR Lungs: CTAB Ext: WWP, no edema   ASSESSMENT/PLAN:     Problem List Items Addressed This Visit       Cardiovascular and Mediastinum   Hypertension    Slightly elevated today but with recent normal BP, no adjustments made today.      Relevant Medications   ELIQUIS 5 MG TABS tablet   NSTEMI (non-ST elevated myocardial infarction) (HCC) - Primary   Relevant Medications   ELIQUIS 5 MG TABS tablet   PAD (peripheral artery disease) (HCC)   Relevant Medications   ELIQUIS 5 MG TABS tablet   Paroxysmal atrial fibrillation (HCC)    RRR today. Tolerant of eliquis, continue.       Relevant Medications   ELIQUIS 5 MG TABS tablet     Other   Status post placement of cardiac pacemaker (Chronic)   Foot drop, bilateral    Braces in place. Disability placard filled out today.        Myles Gip, DO

## 2021-04-06 NOTE — Assessment & Plan Note (Signed)
Slightly elevated today but with recent normal BP, no adjustments made today.

## 2021-04-06 NOTE — Assessment & Plan Note (Signed)
Braces in place. Disability placard filled out today.

## 2021-04-06 NOTE — Assessment & Plan Note (Signed)
RRR today. Tolerant of eliquis, continue.

## 2021-04-14 DIAGNOSIS — I442 Atrioventricular block, complete: Secondary | ICD-10-CM | POA: Diagnosis not present

## 2021-04-17 ENCOUNTER — Encounter: Payer: Medicare HMO | Attending: Cardiology | Admitting: *Deleted

## 2021-04-17 ENCOUNTER — Other Ambulatory Visit: Payer: Self-pay

## 2021-04-17 DIAGNOSIS — I214 Non-ST elevation (NSTEMI) myocardial infarction: Secondary | ICD-10-CM

## 2021-04-17 DIAGNOSIS — Z955 Presence of coronary angioplasty implant and graft: Secondary | ICD-10-CM

## 2021-04-17 NOTE — Progress Notes (Signed)
Initial telephone orientation completed. Diagnosis can be found in [facility] 12/8. EP orientation scheduled for Thursday 2/2 at 9:30.

## 2021-04-30 ENCOUNTER — Other Ambulatory Visit: Payer: Self-pay

## 2021-04-30 ENCOUNTER — Encounter: Payer: Medicare HMO | Attending: Cardiology | Admitting: *Deleted

## 2021-04-30 VITALS — Ht 66.7 in | Wt 198.8 lb

## 2021-04-30 DIAGNOSIS — I252 Old myocardial infarction: Secondary | ICD-10-CM | POA: Insufficient documentation

## 2021-04-30 DIAGNOSIS — I214 Non-ST elevation (NSTEMI) myocardial infarction: Secondary | ICD-10-CM

## 2021-04-30 DIAGNOSIS — Z955 Presence of coronary angioplasty implant and graft: Secondary | ICD-10-CM | POA: Insufficient documentation

## 2021-04-30 NOTE — Patient Instructions (Signed)
Patient Instructions  Patient Details  Name: Justin Warren MRN: [id number] Date of Birth: 03/18/1946 Referring Provider:  Andrez Grime, MD  Below are your personal goals for exercise, nutrition, and risk factors. Our goal is to help you stay on track towards obtaining and maintaining these goals. We will be discussing your progress on these goals with you throughout the program.  Initial Exercise Prescription:  Initial Exercise Prescription - 04/30/21 1100       Date of Initial Exercise RX and Referring Provider   Date 04/30/21    Referring Provider Donnelly Angelica MD      Oxygen   Maintain Oxygen Saturation 88% or higher      Treadmill   MPH 1    Grade 0.5    Minutes 15    METs 1.8      NuStep   Level 1    SPM 80    Minutes 15    METs 1.5      T5 Nustep   Level 1    SPM 80    Minutes 15    METs 1.5      Track   Laps 15    Minutes 15    METs 1.82      Prescription Details   Frequency (times per week) 2    Duration Progress to 30 minutes of continuous aerobic without signs/symptoms of physical distress      Intensity   THRR 40-80% of Max Heartrate 92-125    Ratings of Perceived Exertion 11-13    Perceived Dyspnea 0-4      Progression   Progression Continue to progress workloads to maintain intensity without signs/symptoms of physical distress.      Resistance Training   Training Prescription Yes    Weight 3 lb    Reps 10-15             Exercise Goals: Frequency: Be able to perform aerobic exercise two to three times per week in program working toward 2-5 days per week of home exercise.  Intensity: Work with a perceived exertion of 11 (fairly light) - 15 (hard) while following your exercise prescription.  We will make changes to your prescription with you as you progress through the program.   Duration: Be able to do 30 to 45 minutes of continuous aerobic exercise in addition to a 5 minute warm-up and a 5 minute cool-down routine.   Nutrition  Goals: Your personal nutrition goals will be established when you do your nutrition analysis with the dietician.  The following are general nutrition guidelines to follow: Cholesterol < 253m/day Sodium < 15056mday Fiber: Men over 50 yrs - 30 grams per day  Personal Goals:  Personal Goals and Risk Factors at Admission - 04/30/21 1142       Core Components/Risk Factors/Patient Goals on Admission    Weight Management Yes;Obesity;Weight Loss    Intervention Weight Management: Develop a combined nutrition and exercise program designed to reach desired caloric intake, while maintaining appropriate intake of nutrient and fiber, sodium and fats, and appropriate energy expenditure required for the weight goal.;Weight Management: Provide education and appropriate resources to help participant work on and attain dietary goals.;Obesity: Provide education and appropriate resources to help participant work on and attain dietary goals.;Weight Management/Obesity: Establish reasonable short term and long term weight goals.    Admit Weight 198 lb 12.8 oz (90.2 kg)    Goal Weight: Short Term 195 lb (88.5 kg)    Goal Weight: Long Term  190 lb (86.2 kg)    Expected Outcomes Short Term: Continue to assess and modify interventions until short term weight is achieved;Long Term: Adherence to nutrition and physical activity/exercise program aimed toward attainment of established weight goal;Weight Loss: Understanding of general recommendations for a balanced deficit meal plan, which promotes 1-2 lb weight loss per week and includes a negative energy balance of [phone] kcal/d;Understanding recommendations for meals to include 15-35% energy as protein, 25-35% energy from fat, 35-60% energy from carbohydrates, less than 241m of dietary cholesterol, 20-35 gm of total fiber daily;Understanding of distribution of calorie intake throughout the day with the consumption of 4-5 meals/snacks    Improve shortness of breath with  ADL's Yes    Intervention Provide education, individualized exercise plan and daily activity instruction to help decrease symptoms of SOB with activities of daily living.    Expected Outcomes Short Term: Improve cardiorespiratory fitness to achieve a reduction of symptoms when performing ADLs;Long Term: Be able to perform more ADLs without symptoms or delay the onset of symptoms    Hypertension Yes    Intervention Provide education on lifestyle modifcations including regular physical activity/exercise, weight management, moderate sodium restriction and increased consumption of fresh fruit, vegetables, and low fat dairy, alcohol moderation, and smoking cessation.;Monitor prescription use compliance.    Expected Outcomes Short Term: Continued assessment and intervention until BP is < 140/95mHG in hypertensive participants. < 130/8026mG in hypertensive participants with diabetes, heart failure or chronic kidney disease.;Long Term: Maintenance of blood pressure at goal levels.    Lipids Yes    Intervention Provide education and support for participant on nutrition & aerobic/resistive exercise along with prescribed medications to achieve LDL <69m55mDL >40mg77m Expected Outcomes Short Term: Participant states understanding of desired cholesterol values and is compliant with medications prescribed. Participant is following exercise prescription and nutrition guidelines.;Long Term: Cholesterol controlled with medications as prescribed, with individualized exercise RX and with personalized nutrition plan. Value goals: LDL < 69mg,47m > 40 mg.             Tobacco Use Initial Evaluation: Social History   Tobacco Use  Smoking Status Former   Packs/day: 2.00   Years: 30.00   Pack years: 60.00   Types: Cigarettes  Smokeless Tobacco Never  Tobacco Comments   quit in 1991-1992    Exercise Goals and Review:  Exercise Goals     Row Name 04/30/21 1140             Exercise Goals   Increase  Physical Activity Yes       Intervention Provide advice, education, support and counseling about physical activity/exercise needs.;Develop an individualized exercise prescription for aerobic and resistive training based on initial evaluation findings, risk stratification, comorbidities and participant's personal goals.       Expected Outcomes Short Term: Attend rehab on a regular basis to increase amount of physical activity.;Long Term: Add in home exercise to make exercise part of routine and to increase amount of physical activity.;Long Term: Exercising regularly at least 3-5 days a week.       Increase Strength and Stamina Yes       Intervention Provide advice, education, support and counseling about physical activity/exercise needs.;Develop an individualized exercise prescription for aerobic and resistive training based on initial evaluation findings, risk stratification, comorbidities and participant's personal goals.       Expected Outcomes Short Term: Increase workloads from initial exercise prescription for resistance, speed, and METs.;Short Term: Perform resistance training  exercises routinely during rehab and add in resistance training at home;Long Term: Improve cardiorespiratory fitness, muscular endurance and strength as measured by increased METs and functional capacity (6MWT)       Able to understand and use rate of perceived exertion (RPE) scale Yes       Intervention Provide education and explanation on how to use RPE scale       Expected Outcomes Short Term: Able to use RPE daily in rehab to express subjective intensity level;Long Term:  Able to use RPE to guide intensity level when exercising independently       Able to understand and use Dyspnea scale Yes       Intervention Provide education and explanation on how to use Dyspnea scale       Expected Outcomes Short Term: Able to use Dyspnea scale daily in rehab to express subjective sense of shortness of breath during exertion;Long Term:  Able to use Dyspnea scale to guide intensity level when exercising independently       Knowledge and understanding of Target Heart Rate Range (THRR) Yes       Intervention Provide education and explanation of THRR including how the numbers were predicted and where they are located for reference       Expected Outcomes Short Term: Able to state/look up THRR;Long Term: Able to use THRR to govern intensity when exercising independently;Short Term: Able to use daily as guideline for intensity in rehab       Able to check pulse independently Yes       Intervention Provide education and demonstration on how to check pulse in carotid and radial arteries.;Review the importance of being able to check your own pulse for safety during independent exercise       Expected Outcomes Short Term: Able to explain why pulse checking is important during independent exercise;Long Term: Able to check pulse independently and accurately       Understanding of Exercise Prescription Yes       Intervention Provide education, explanation, and written materials on patient's individual exercise prescription       Expected Outcomes Short Term: Able to explain program exercise prescription;Long Term: Able to explain home exercise prescription to exercise independently                Copy of goals given to participant.

## 2021-04-30 NOTE — Progress Notes (Signed)
Cardiac Individual Treatment Plan  Patient Details  Name: Justin Warren MRN: [id number] Date of Birth: Jun 26, 1945 Referring Provider:   Flowsheet Row Cardiac Rehab from 04/30/2021 in [facility] Cardiac and Pulmonary Rehab  Referring Provider Donnelly Angelica MD       Initial Encounter Date:  Flowsheet Row Cardiac Rehab from 04/30/2021 in [facility] Cardiac and Pulmonary Rehab  Date 04/30/21       Visit Diagnosis: NSTEMI (non-ST elevated myocardial infarction) [facility])  Status post coronary artery stent placement  Patient's Home Medications on Admission:  Current Outpatient Medications:    acetaminophen (TYLENOL) 650 MG CR tablet, Take 650 mg by mouth every 8 (eight) hours as needed for pain., Disp: , Rfl:    atorvastatin (LIPITOR) 80 MG tablet, Take 1 tablet (80 mg total) by mouth daily., Disp: 30 tablet, Rfl: 2   busPIRone (BUSPAR) 15 MG tablet, TAKE 1 TABLET TWICE DAILY, Disp: 180 tablet, Rfl: 0   clopidogrel (PLAVIX) 75 MG tablet, Take 1 tablet (75 mg total) by mouth daily with breakfast., Disp: 30 tablet, Rfl: 2   dicyclomine (BENTYL) 20 MG tablet, Take 1 tablet (20 mg total) by mouth 3 (three) times daily before meals., Disp: 90 tablet, Rfl: 0   ELIQUIS 5 MG TABS tablet, SMARTSIG:1 Tablet(s) By Mouth Every 12 Hours, Disp: , Rfl:    FLUoxetine (PROZAC) 20 MG capsule, Take 1 capsule (20 mg total) by mouth daily., Disp: 90 capsule, Rfl: 1   losartan (COZAAR) 50 MG tablet, Take 1 tablet (50 mg total) by mouth daily., Disp: 30 tablet, Rfl: 2   metoprolol succinate (TOPROL-XL) 25 MG 24 hr tablet, Take 25 mg by mouth daily., Disp: , Rfl:    pantoprazole (PROTONIX) 20 MG tablet, Take 20 mg by mouth daily., Disp: , Rfl:    tadalafil (CIALIS) 5 MG tablet, Take 1 tablet (5 mg total) by mouth daily as needed for erectile dysfunction., Disp: 30 tablet, Rfl: 11  Past Medical History: Past Medical History:  Diagnosis Date   Anxiety    Bradycardia    GERD (gastroesophageal reflux disease)    History of  kidney stones    Hyperlipidemia    Hypertension    Kidney stone    Neuropathy     Tobacco Use: Social History   Tobacco Use  Smoking Status Former   Packs/day: 2.00   Years: 30.00   Pack years: 60.00   Types: Cigarettes  Smokeless Tobacco Never  Tobacco Comments   quit in 1991-1992    Labs: Recent Review Flowsheet Data     Labs for ITP Cardiac and Pulmonary Rehab Latest Ref Rng & Units 11/23/2018 05/23/2019 04/16/2020 11/03/2020 02/27/2021   Cholestrol 0 - 200 mg/dL 181 135 171 163 155   LDLCALC 0 - 99 mg/dL 103(H) 71 104(H) 99 83   HDL >40 mg/dL 41 40 39(L) 35(L) 45   Trlycerides <150 mg/dL 185(H) 138 159(H) 167(H) 135   Hemoglobin A1c 4.8 - 5.6 % 5.1 - - - -        Exercise Target Goals: Exercise Program Goal: Individual exercise prescription set using results from initial 6 min walk test and THRR while considering  patients activity barriers and safety.   Exercise Prescription Goal: Initial exercise prescription builds to 30-45 minutes a day of aerobic activity, 2-3 days per week.  Home exercise guidelines will be given to patient during program as part of exercise prescription that the participant will acknowledge.   Education: Aerobic Exercise: - Group verbal and visual presentation  on the components of exercise prescription. Introduces F.I.T.T principle from ACSM for exercise prescriptions.  Reviews F.I.T.T. principles of aerobic exercise including progression. Written material given at graduation. Flowsheet Row Cardiac Rehab from 04/30/2021 in [facility] Cardiac and Pulmonary Rehab  Education need identified 04/30/21       Education: Resistance Exercise: - Group verbal and visual presentation on the components of exercise prescription. Introduces F.I.T.T principle from ACSM for exercise prescriptions  Reviews F.I.T.T. principles of resistance exercise including progression. Written material given at graduation.    Education: Exercise & Equipment Safety: - Individual  verbal instruction and demonstration of equipment use and safety with use of the equipment. Flowsheet Row Cardiac Rehab from 04/30/2021 in [facility] Cardiac and Pulmonary Rehab  Date 04/30/21  Educator [facility]  Instruction Review Code 1- Verbalizes Understanding       Education: Exercise Physiology & General Exercise Guidelines: - Group verbal and written instruction with models to review the exercise physiology of the cardiovascular system and associated critical values. Provides general exercise guidelines with specific guidelines to those with heart or lung disease.    Education: Flexibility, Balance, Mind/Body Relaxation: - Group verbal and visual presentation with interactive activity on the components of exercise prescription. Introduces F.I.T.T principle from ACSM for exercise prescriptions. Reviews F.I.T.T. principles of flexibility and balance exercise training including progression. Also discusses the mind body connection.  Reviews various relaxation techniques to help reduce and manage stress (i.e. Deep breathing, progressive muscle relaxation, and visualization). Balance handout provided to take home. Written material given at graduation.   Activity Barriers & Risk Stratification:  Activity Barriers & Cardiac Risk Stratification - 04/30/21 1137       Activity Barriers & Cardiac Risk Stratification   Activity Barriers Other (comment);Back Problems;Right Knee Replacement;Deconditioning;Muscular Weakness;Shortness of Breath;Balance Concerns;History of Falls;Assistive Device    Comments bilateral foot drop- wears braces, significant fatigue since MI    Cardiac Risk Stratification High             6 Minute Walk:  6 Minute Walk     Row Name 04/30/21 1137         6 Minute Walk   Phase Initial     Distance 594 feet     Walk Time 6 minutes     # of Rest Breaks 0     MPH 1.13     METS 0.98     RPE 13     Perceived Dyspnea  1     VO2 Peak 3.42     Symptoms Yes (comment)      Comments low back pain 2/10, SOB     Resting HR 58 bpm     Resting BP 126/64     Resting Oxygen Saturation  99 %     Exercise Oxygen Saturation  during 6 min walk 95 %     Max Ex. HR 84 bpm     Max Ex. BP 132/62     2 Minute Post BP 124/62              Oxygen Initial Assessment:   Oxygen Re-Evaluation:   Oxygen Discharge (Final Oxygen Re-Evaluation):   Initial Exercise Prescription:  Initial Exercise Prescription - 04/30/21 1100       Date of Initial Exercise RX and Referring Provider   Date 04/30/21    Referring Provider Donnelly Angelica MD      Oxygen   Maintain Oxygen Saturation 88% or higher      Treadmill   MPH 1  Grade 0.5    Minutes 15    METs 1.8      NuStep   Level 1    SPM 80    Minutes 15    METs 1.5      T5 Nustep   Level 1    SPM 80    Minutes 15    METs 1.5      Track   Laps 15    Minutes 15    METs 1.82      Prescription Details   Frequency (times per week) 2    Duration Progress to 30 minutes of continuous aerobic without signs/symptoms of physical distress      Intensity   THRR 40-80% of Max Heartrate 92-125    Ratings of Perceived Exertion 11-13    Perceived Dyspnea 0-4      Progression   Progression Continue to progress workloads to maintain intensity without signs/symptoms of physical distress.      Resistance Training   Training Prescription Yes    Weight 3 lb    Reps 10-15             Perform Capillary Blood Glucose checks as needed.  Exercise Prescription Changes:   Exercise Prescription Changes     Row Name 04/30/21 1100             Response to Exercise   Blood Pressure (Admit) 126/64       Blood Pressure (Exercise) 132/62       Blood Pressure (Exit) 124/62       Heart Rate (Admit) 58 bpm       Heart Rate (Exercise) 84 bpm       Heart Rate (Exit) 62 bpm       Oxygen Saturation (Admit) 99 %       Oxygen Saturation (Exercise) 95 %       Rating of Perceived Exertion (Exercise) 13       Perceived  Dyspnea (Exercise) 1       Symptoms SOB, low back pain 2/10       Comments walk test results                Exercise Comments:   Exercise Goals and Review:   Exercise Goals     Row Name 04/30/21 1140             Exercise Goals   Increase Physical Activity Yes       Intervention Provide advice, education, support and counseling about physical activity/exercise needs.;Develop an individualized exercise prescription for aerobic and resistive training based on initial evaluation findings, risk stratification, comorbidities and participant's personal goals.       Expected Outcomes Short Term: Attend rehab on a regular basis to increase amount of physical activity.;Long Term: Add in home exercise to make exercise part of routine and to increase amount of physical activity.;Long Term: Exercising regularly at least 3-5 days a week.       Increase Strength and Stamina Yes       Intervention Provide advice, education, support and counseling about physical activity/exercise needs.;Develop an individualized exercise prescription for aerobic and resistive training based on initial evaluation findings, risk stratification, comorbidities and participant's personal goals.       Expected Outcomes Short Term: Increase workloads from initial exercise prescription for resistance, speed, and METs.;Short Term: Perform resistance training exercises routinely during rehab and add in resistance training at home;Long Term: Improve cardiorespiratory fitness, muscular endurance and strength as measured by  increased METs and functional capacity (6MWT)       Able to understand and use rate of perceived exertion (RPE) scale Yes       Intervention Provide education and explanation on how to use RPE scale       Expected Outcomes Short Term: Able to use RPE daily in rehab to express subjective intensity level;Long Term:  Able to use RPE to guide intensity level when exercising independently       Able to understand  and use Dyspnea scale Yes       Intervention Provide education and explanation on how to use Dyspnea scale       Expected Outcomes Short Term: Able to use Dyspnea scale daily in rehab to express subjective sense of shortness of breath during exertion;Long Term: Able to use Dyspnea scale to guide intensity level when exercising independently       Knowledge and understanding of Target Heart Rate Range (THRR) Yes       Intervention Provide education and explanation of THRR including how the numbers were predicted and where they are located for reference       Expected Outcomes Short Term: Able to state/look up THRR;Long Term: Able to use THRR to govern intensity when exercising independently;Short Term: Able to use daily as guideline for intensity in rehab       Able to check pulse independently Yes       Intervention Provide education and demonstration on how to check pulse in carotid and radial arteries.;Review the importance of being able to check your own pulse for safety during independent exercise       Expected Outcomes Short Term: Able to explain why pulse checking is important during independent exercise;Long Term: Able to check pulse independently and accurately       Understanding of Exercise Prescription Yes       Intervention Provide education, explanation, and written materials on patient's individual exercise prescription       Expected Outcomes Short Term: Able to explain program exercise prescription;Long Term: Able to explain home exercise prescription to exercise independently                Exercise Goals Re-Evaluation :   Discharge Exercise Prescription (Final Exercise Prescription Changes):  Exercise Prescription Changes - 04/30/21 1100       Response to Exercise   Blood Pressure (Admit) 126/64    Blood Pressure (Exercise) 132/62    Blood Pressure (Exit) 124/62    Heart Rate (Admit) 58 bpm    Heart Rate (Exercise) 84 bpm    Heart Rate (Exit) 62 bpm    Oxygen  Saturation (Admit) 99 %    Oxygen Saturation (Exercise) 95 %    Rating of Perceived Exertion (Exercise) 13    Perceived Dyspnea (Exercise) 1    Symptoms SOB, low back pain 2/10    Comments walk test results             Nutrition:  Target Goals: Understanding of nutrition guidelines, daily intake of sodium <1593m, cholesterol <2051m calories 30% from fat and 7% or less from saturated fats, daily to have 5 or more servings of fruits and vegetables.  Education: All About Nutrition: -Group instruction provided by verbal, written material, interactive activities, discussions, models, and posters to present general guidelines for heart healthy nutrition including fat, fiber, MyPlate, the role of sodium in heart healthy nutrition, utilization of the nutrition label, and utilization of this knowledge for meal planning. Follow up email  sent as well. Written material given at graduation. Flowsheet Row Cardiac Rehab from 04/30/2021 in [facility] Cardiac and Pulmonary Rehab  Education need identified 04/30/21       Biometrics:  Pre Biometrics - 04/30/21 1141       Pre Biometrics   Height 5' 6.7" (1.694 m)    Weight 198 lb 12.8 oz (90.2 kg)    BMI (Calculated) 31.42    Single Leg Stand 0 seconds              Nutrition Therapy Plan and Nutrition Goals:   Nutrition Assessments:  MEDIFICTS Score Key: ?70 Need to make dietary changes  40-70 Heart Healthy Diet ? 40 Therapeutic Level Cholesterol Diet  Flowsheet Row Cardiac Rehab from 04/30/2021 in [facility] Cardiac and Pulmonary Rehab  Picture Your Plate Total Score on Admission 55      Picture Your Plate Scores: <41 Unhealthy dietary pattern with much room for improvement. 41-50 Dietary pattern unlikely to meet recommendations for good health and room for improvement. 51-60 More healthful dietary pattern, with some room for improvement.  >60 Healthy dietary pattern, although there may be some specific behaviors that could be improved.     Nutrition Goals Re-Evaluation:   Nutrition Goals Discharge (Final Nutrition Goals Re-Evaluation):   Psychosocial: Target Goals: Acknowledge presence or absence of significant depression and/or stress, maximize coping skills, provide positive support system. Participant is able to verbalize types and ability to use techniques and skills needed for reducing stress and depression.   Education: Stress, Anxiety, and Depression - Group verbal and visual presentation to define topics covered.  Reviews how body is impacted by stress, anxiety, and depression.  Also discusses healthy ways to reduce stress and to treat/manage anxiety and depression.  Written material given at graduation. Flowsheet Row Cardiac Rehab from 04/30/2021 in [facility] Cardiac and Pulmonary Rehab  Education need identified 04/30/21       Education: Sleep Hygiene -Provides group verbal and written instruction about how sleep can affect your health.  Define sleep hygiene, discuss sleep cycles and impact of sleep habits. Review good sleep hygiene tips.    Initial Review & Psychosocial Screening:  Initial Psych Review & Screening - 04/17/21 1306       Initial Review   Current issues with History of Depression      Family Dynamics   Good Support System? Yes   wife     Barriers   Psychosocial barriers to participate in program There are no identifiable barriers or psychosocial needs.;The patient should benefit from training in stress management and relaxation.      Screening Interventions   Interventions Encouraged to exercise;To provide support and resources with identified psychosocial needs;Provide feedback about the scores to participant    Expected Outcomes Short Term goal: Utilizing psychosocial counselor, staff and physician to assist with identification of specific Stressors or current issues interfering with healing process. Setting desired goal for each stressor or current issue identified.;Long Term Goal:  Stressors or current issues are controlled or eliminated.;Short Term goal: Identification and review with participant of any Quality of Life or Depression concerns found by scoring the questionnaire.;Long Term goal: The participant improves quality of Life and PHQ9 Scores as seen by post scores and/or verbalization of changes             Quality of Life Scores:   Quality of Life - 04/30/21 1141       Quality of Life   Select Quality of Life  Quality of Life Scores   Health/Function Pre 14.13 %    Socioeconomic Pre 16 %    Psych/Spiritual Pre 16.29 %    Family Pre 16.4 %    GLOBAL Pre 15.31 %            Scores of 19 and below usually indicate a poorer quality of life in these areas.  A difference of  2-3 points is a clinically meaningful difference.  A difference of 2-3 points in the total score of the Quality of Life Index has been associated with significant improvement in overall quality of life, self-image, physical symptoms, and general health in studies assessing change in quality of life.  PHQ-9: Recent Review Flowsheet Data     Depression screen [facility] 2/9 04/30/2021 06/23/2020 05/08/2020 05/21/2019 05/18/2018   Decreased Interest 3 0 0 2 0   Down, Depressed, Hopeless 1 0 0 2 0   PHQ - 2 Score 4 0 0 4 0   Altered sleeping 1 - 0 2 0   Tired, decreased energy 1 - 0 2 0   Change in appetite 2 - 0 1 0   Feeling bad or failure about yourself  1 - 0 2 0   Trouble concentrating 1 - 0 1 0   Moving slowly or fidgety/restless 0 - 0 2 0   Suicidal thoughts 0 - 0 1 0   PHQ-9 Score 10 - 0 15 0   Difficult doing work/chores Somewhat difficult - Not difficult at all Somewhat difficult Not difficult at all      Interpretation of Total Score  Total Score Depression Severity:  1-4 = Minimal depression, 5-9 = Mild depression, 10-14 = Moderate depression, 15-19 = Moderately severe depression, 20-27 = Severe depression   Psychosocial Evaluation and Intervention:  Psychosocial  Evaluation - 04/17/21 1311       Psychosocial Evaluation & Interventions   Interventions Encouraged to exercise with the program and follow exercise prescription    Comments Mr. Gural reports feeling back to his usual self after his MI with stents. He states he is almost returned to his usual schedule. He doesn't report any stressors at this time. He has a history of anxiety/depression per notes but doesn't verbalize it when asked. His wife is his main support system. He is looking forward to starting the program to see what it is all about.    Expected Outcomes Short: attend cardiac rehab for education and exercise. Long: develop and maintain positive self care habits.    Continue Psychosocial Services  Follow up required by staff             Psychosocial Re-Evaluation:   Psychosocial Discharge (Final Psychosocial Re-Evaluation):   Vocational Rehabilitation: Provide vocational rehab assistance to qualifying candidates.   Vocational Rehab Evaluation & Intervention:  Vocational Rehab - 04/17/21 1311       Initial Vocational Rehab Evaluation & Intervention   Assessment shows need for Vocational Rehabilitation No             Education: Education Goals: Education classes will be provided on a variety of topics geared toward better understanding of heart health and risk factor modification. Participant will state understanding/return demonstration of topics presented as noted by education test scores.  Learning Barriers/Preferences:  Learning Barriers/Preferences - 04/17/21 1318       Learning Barriers/Preferences   Learning Barriers None    Learning Preferences Individual Instruction             General  Cardiac Education Topics:  AED/CPR: - Group verbal and written instruction with the use of models to demonstrate the basic use of the AED with the basic ABC's of resuscitation.   Anatomy and Cardiac Procedures: - Group verbal and visual presentation and  models provide information about basic cardiac anatomy and function. Reviews the testing methods done to diagnose heart disease and the outcomes of the test results. Describes the treatment choices: Medical Management, Angioplasty, or Coronary Bypass Surgery for treating various heart conditions including Myocardial Infarction, Angina, Valve Disease, and Cardiac Arrhythmias.  Written material given at graduation.   Medication Safety: - Group verbal and visual instruction to review commonly prescribed medications for heart and lung disease. Reviews the medication, class of the drug, and side effects. Includes the steps to properly store meds and maintain the prescription regimen.  Written material given at graduation.   Intimacy: - Group verbal instruction through game format to discuss how heart and lung disease can affect sexual intimacy. Written material given at graduation..   Know Your Numbers and Heart Failure: - Group verbal and visual instruction to discuss disease risk factors for cardiac and pulmonary disease and treatment options.  Reviews associated critical values for Overweight/Obesity, Hypertension, Cholesterol, and Diabetes.  Discusses basics of heart failure: signs/symptoms and treatments.  Introduces Heart Failure Zone chart for action plan for heart failure.  Written material given at graduation. Flowsheet Row Cardiac Rehab from 04/30/2021 in [facility] Cardiac and Pulmonary Rehab  Education need identified 04/30/21       Infection Prevention: - Provides verbal and written material to individual with discussion of infection control including proper hand washing and proper equipment cleaning during exercise session. Flowsheet Row Cardiac Rehab from 04/30/2021 in [facility] Cardiac and Pulmonary Rehab  Date 04/30/21  Educator [facility]  Instruction Review Code 1- Verbalizes Understanding       Falls Prevention: - Provides verbal and written material to individual with discussion of falls  prevention and safety. Flowsheet Row Cardiac Rehab from 04/30/2021 in [facility] Cardiac and Pulmonary Rehab  Date 04/30/21  Educator [facility]  Instruction Review Code 1- Verbalizes Understanding       Other: -Provides group and verbal instruction on various topics (see comments)   Knowledge Questionnaire Score:  Knowledge Questionnaire Score - 04/30/21 1142       Knowledge Questionnaire Score   Pre Score 19/26             Core Components/Risk Factors/Patient Goals at Admission:  Personal Goals and Risk Factors at Admission - 04/30/21 1142       Core Components/Risk Factors/Patient Goals on Admission    Weight Management Yes;Obesity;Weight Loss    Intervention Weight Management: Develop a combined nutrition and exercise program designed to reach desired caloric intake, while maintaining appropriate intake of nutrient and fiber, sodium and fats, and appropriate energy expenditure required for the weight goal.;Weight Management: Provide education and appropriate resources to help participant work on and attain dietary goals.;Obesity: Provide education and appropriate resources to help participant work on and attain dietary goals.;Weight Management/Obesity: Establish reasonable short term and long term weight goals.    Admit Weight 198 lb 12.8 oz (90.2 kg)    Goal Weight: Short Term 195 lb (88.5 kg)    Goal Weight: Long Term 190 lb (86.2 kg)    Expected Outcomes Short Term: Continue to assess and modify interventions until short term weight is achieved;Long Term: Adherence to nutrition and physical activity/exercise program aimed toward attainment of established weight goal;Weight Loss: Understanding  of general recommendations for a balanced deficit meal plan, which promotes 1-2 lb weight loss per week and includes a negative energy balance of [phone] kcal/d;Understanding recommendations for meals to include 15-35% energy as protein, 25-35% energy from fat, 35-60% energy from carbohydrates, less  than 277m of dietary cholesterol, 20-35 gm of total fiber daily;Understanding of distribution of calorie intake throughout the day with the consumption of 4-5 meals/snacks    Improve shortness of breath with ADL's Yes    Intervention Provide education, individualized exercise plan and daily activity instruction to help decrease symptoms of SOB with activities of daily living.    Expected Outcomes Short Term: Improve cardiorespiratory fitness to achieve a reduction of symptoms when performing ADLs;Long Term: Be able to perform more ADLs without symptoms or delay the onset of symptoms    Hypertension Yes    Intervention Provide education on lifestyle modifcations including regular physical activity/exercise, weight management, moderate sodium restriction and increased consumption of fresh fruit, vegetables, and low fat dairy, alcohol moderation, and smoking cessation.;Monitor prescription use compliance.    Expected Outcomes Short Term: Continued assessment and intervention until BP is < 140/955mHG in hypertensive participants. < 130/8042mG in hypertensive participants with diabetes, heart failure or chronic kidney disease.;Long Term: Maintenance of blood pressure at goal levels.    Lipids Yes    Intervention Provide education and support for participant on nutrition & aerobic/resistive exercise along with prescribed medications to achieve LDL <58m45mDL >40mg39m Expected Outcomes Short Term: Participant states understanding of desired cholesterol values and is compliant with medications prescribed. Participant is following exercise prescription and nutrition guidelines.;Long Term: Cholesterol controlled with medications as prescribed, with individualized exercise RX and with personalized nutrition plan. Value goals: LDL < 58mg,36m > 40 mg.             Education:Diabetes - Individual verbal and written instruction to review signs/symptoms of diabetes, desired ranges of glucose level fasting,  after meals and with exercise. Acknowledge that pre and post exercise glucose checks will be done for 3 sessions at entry of program.   Core Components/Risk Factors/Patient Goals Review:    Core Components/Risk Factors/Patient Goals at Discharge (Final Review):    ITP Comments:  ITP Comments     Row Name 04/17/21 1310 04/30/21 1137         ITP Comments Initial telephone orientation completed. Diagnosis can be found in [facility] EP orientation scheduled for Thursday 2/2 at 9:30. Completed 6MWT and gym orientation. Initial ITP created and sent for review to Dr. Mark MEmily Filbertcal Director.               Comments: Initial ITP

## 2021-05-05 ENCOUNTER — Other Ambulatory Visit: Payer: Self-pay

## 2021-05-05 DIAGNOSIS — Z955 Presence of coronary angioplasty implant and graft: Secondary | ICD-10-CM

## 2021-05-05 DIAGNOSIS — I252 Old myocardial infarction: Secondary | ICD-10-CM | POA: Diagnosis not present

## 2021-05-05 DIAGNOSIS — I214 Non-ST elevation (NSTEMI) myocardial infarction: Secondary | ICD-10-CM

## 2021-05-05 NOTE — Progress Notes (Signed)
Daily Session Note  Patient Details  Name: TRAVIOUS VANOVER MRN: [id number] Date of Birth: [birth year] Referring Provider:   Flowsheet Row Cardiac Rehab from 04/30/2021 in [facility]  Referring Provider Donnelly Angelica MD       Encounter Date: 05/05/2021  Check In:  Session Check In - 05/05/21 0928       Check-In   Supervising physician immediately available to respond to emergencies See telemetry face sheet for immediately available ER MD    Location [facility]-Cardiac & Pulmonary Rehab    Staff Present Birdie Sons, MPA, RN;Jessica Luan Pulling, MA, RCEP, CCRP, CCET;Amanda Sommer, BA, ACSM CEP, Exercise Physiologist    Virtual Visit No    Medication changes reported     No    Fall or balance concerns reported    No    Warm-up and Cool-down Performed on first and last piece of equipment    Resistance Training Performed Yes    VAD Patient? No    PAD/SET Patient? No      Pain Assessment   Currently in Pain? No/denies                Social History   Tobacco Use  Smoking Status Former   Packs/day: 2.00   Years: 30.00   Pack years: 60.00   Types: Cigarettes  Smokeless Tobacco Never  Tobacco Comments   quit in 1991-1992    Goals Met:  Independence with exercise equipment Exercise tolerated well No report of concerns or symptoms today Strength training completed today  Goals Unmet:  Not Applicable  Comments: First full day of exercise!  Patient was oriented to gym and equipment including functions, settings, policies, and procedures.  Patient's individual exercise prescription and treatment plan were reviewed.  All starting workloads were established based on the results of the 6 minute walk test done at initial orientation visit.  The plan for exercise progression was also introduced and progression will be customized based on patient's performance and goals.    Dr. Emily Filbert is Medical Director for [place].  Dr. Ottie Glazier is Medical Director for [facility] Pulmonary Rehabilitation.

## 2021-05-07 ENCOUNTER — Other Ambulatory Visit: Payer: Self-pay

## 2021-05-07 DIAGNOSIS — Z955 Presence of coronary angioplasty implant and graft: Secondary | ICD-10-CM | POA: Diagnosis not present

## 2021-05-07 DIAGNOSIS — I252 Old myocardial infarction: Secondary | ICD-10-CM | POA: Diagnosis not present

## 2021-05-07 DIAGNOSIS — I214 Non-ST elevation (NSTEMI) myocardial infarction: Secondary | ICD-10-CM

## 2021-05-07 NOTE — Progress Notes (Signed)
Daily Session Note  Patient Details  Name: Justin Warren MRN: [id number] Date of Birth: [birth year] Referring Provider:   Flowsheet Row Cardiac Rehab from 04/30/2021 in [facility]iac [facility] Re[facility]  Referring Provider Donnelly Angelica MD       Encounter Date: 05/07/2021  Check In:  Session Check In - 05/07/21 0923       Check-In   Supervising physician immediately available to respond to emergencies See telemetry face sheet for immediately available ER MD    Location [facility]-Cardiac & Pul[facility] Re[facility]    Staff Present Birdie Sons, MPA, Elveria Rising, BA, ACSM CEP, Exercise Physiologist;Levin Dagostino Amedeo Plenty, BS, ACSM CEP, Exercise Physiologist    Virtual Visit No    Medication changes reported     No    Fall or balance concerns reported    No    Warm-up and Cool-down Performed on first and last piece of equipment    Resistance Training Performed Yes    VAD Patient? No    PAD/SET Patient? No      Pain Assessment   Currently in Pain? No/denies                Social History   Tobacco Use  Smoking Status Former   Packs/day: 2.00   Years: 30.00   Pack years: 60.00   Types: Cigarettes  Smokeless Tobacco Never  Tobacco Comments   quit in 1991-1992    Goals Met:  Independence with exercise equipment Exercise tolerated well No report of concerns or symptoms today Strength training completed today  Goals Unmet:  Not Applicable  Comments: Pt able to follow exercise prescription today without complaint.  Will continue to monitor for progression.    Dr. Emily Filbert is Medical Director for [place].  Dr. Ottie Glazier is Medical Director for [facility] Pulmonary Rehabilitation.

## 2021-05-12 ENCOUNTER — Other Ambulatory Visit: Payer: Self-pay

## 2021-05-12 DIAGNOSIS — Z955 Presence of coronary angioplasty implant and graft: Secondary | ICD-10-CM

## 2021-05-12 DIAGNOSIS — I252 Old myocardial infarction: Secondary | ICD-10-CM | POA: Diagnosis not present

## 2021-05-12 DIAGNOSIS — I214 Non-ST elevation (NSTEMI) myocardial infarction: Secondary | ICD-10-CM

## 2021-05-12 NOTE — Progress Notes (Signed)
Daily Session Note  Patient Details  Name: Justin Warren MRN: [id number] Date of Birth: [birth year] Referring Provider:   Flowsheet Row Cardiac Rehab from 04/30/2021 in [facility] Card[facility] Rehab  Referring Provider Donnelly Angelica MD       Encounter Date: 05/12/2021  Check In:  Session Check In - 05/12/21 0942       Check-In   Supervising physician immediately available to respond to emergencies See telemetry face sheet for immediately available ER MD    Location [facility]-Cardiac & Pulmonary Rehab    Staff Present Birdie Sons, MPA, RN;Jessica Luan Pulling, MA, RCEP, CCRP, CCET;Amanda Sommer, BA, ACSM CEP, Exercise Physiologist    Virtual Visit No    Medication changes reported     No    Fall or balance concerns reported    No    Warm-up and Cool-down Performed on first and last piece of equipment    Resistance Training Performed Yes    VAD Patient? No    PAD/SET Patient? No      Pain Assessment   Currently in Pain? No/denies                Social History   Tobacco Use  Smoking Status Former   Packs/day: 2.00   Years: 30.00   Pack years: 60.00   Types: Cigarettes  Smokeless Tobacco Never  Tobacco Comments   quit in 1991-1992    Goals Met:  Independence with exercise equipment Exercise tolerated well No report of concerns or symptoms today Strength training completed today  Goals Unmet:  Not Applicable  Comments: Pt able to follow exercise prescription today without complaint.  Will continue to monitor for progression.    Dr. Emily Filbert is Medical Director for [place].  Dr. Ottie Glazier is Medical Director for [facility] Pulmonary Rehabilitation.

## 2021-05-13 ENCOUNTER — Other Ambulatory Visit: Payer: Self-pay

## 2021-05-13 DIAGNOSIS — I214 Non-ST elevation (NSTEMI) myocardial infarction: Secondary | ICD-10-CM

## 2021-05-13 NOTE — Progress Notes (Signed)
Completed initial RD consultation ?

## 2021-05-18 ENCOUNTER — Telehealth: Payer: Self-pay | Admitting: Family Medicine

## 2021-05-18 DIAGNOSIS — F419 Anxiety disorder, unspecified: Secondary | ICD-10-CM

## 2021-05-18 MED ORDER — BUSPIRONE HCL 15 MG PO TABS: 15.0000 mg | ORAL_TABLET | Freq: Two times a day (BID) | ORAL | 0 refills | Status: DC

## 2021-05-18 NOTE — Telephone Encounter (Signed)
[facility] faxed refill request for the following medications:  busPIRone (BUSPAR) 15 MG tablet   Please advise.

## 2021-05-20 ENCOUNTER — Encounter: Payer: Self-pay | Admitting: *Deleted

## 2021-05-20 DIAGNOSIS — Z955 Presence of coronary angioplasty implant and graft: Secondary | ICD-10-CM

## 2021-05-20 DIAGNOSIS — I214 Non-ST elevation (NSTEMI) myocardial infarction: Secondary | ICD-10-CM

## 2021-05-20 NOTE — Progress Notes (Signed)
Cardiac Individual Treatment Plan  Patient Details  Name: Justin Warren MRN: [id number] Date of Birth: 08-Feb-1946 Referring Provider:   Flowsheet Row Cardiac Rehab from 04/30/2021 in [facility] Cardiac and Pulmonary Rehab  Referring Provider Donnelly Angelica MD       Initial Encounter Date:  Flowsheet Row Cardiac Rehab from 04/30/2021 in [facility] Cardiac and Pulmonary Rehab  Date 04/30/21       Visit Diagnosis: NSTEMI (non-ST elevated myocardial infarction) [facility])  Status post coronary artery stent placement  Patient's Home Medications on Admission:  Current Outpatient Medications:    acetaminophen (TYLENOL) 650 MG CR tablet, Take 650 mg by mouth every 8 (eight) hours as needed for pain., Disp: , Rfl:    atorvastatin (LIPITOR) 80 MG tablet, Take 1 tablet (80 mg total) by mouth daily., Disp: 30 tablet, Rfl: 2   busPIRone (BUSPAR) 15 MG tablet, Take 1 tablet (15 mg total) by mouth 2 (two) times daily., Disp: 180 tablet, Rfl: 0   clopidogrel (PLAVIX) 75 MG tablet, Take 1 tablet (75 mg total) by mouth daily with breakfast., Disp: 30 tablet, Rfl: 2   dicyclomine (BENTYL) 20 MG tablet, Take 1 tablet (20 mg total) by mouth 3 (three) times daily before meals., Disp: 90 tablet, Rfl: 0   ELIQUIS 5 MG TABS tablet, SMARTSIG:1 Tablet(s) By Mouth Every 12 Hours, Disp: , Rfl:    FLUoxetine (PROZAC) 20 MG capsule, Take 1 capsule (20 mg total) by mouth daily., Disp: 90 capsule, Rfl: 1   losartan (COZAAR) 50 MG tablet, Take 1 tablet (50 mg total) by mouth daily., Disp: 30 tablet, Rfl: 2   metoprolol succinate (TOPROL-XL) 25 MG 24 hr tablet, Take 25 mg by mouth daily., Disp: , Rfl:    pantoprazole (PROTONIX) 20 MG tablet, Take 20 mg by mouth daily., Disp: , Rfl:    tadalafil (CIALIS) 5 MG tablet, Take 1 tablet (5 mg total) by mouth daily as needed for erectile dysfunction., Disp: 30 tablet, Rfl: 11  Past Medical History: Past Medical History:  Diagnosis Date   Anxiety    Bradycardia    GERD (gastroesophageal  reflux disease)    History of kidney stones    Hyperlipidemia    Hypertension    Kidney stone    Neuropathy     Tobacco Use: Social History   Tobacco Use  Smoking Status Former   Packs/day: 2.00   Years: 30.00   Pack years: 60.00   Types: Cigarettes  Smokeless Tobacco Never  Tobacco Comments   quit in 1991-1992    Labs: Recent Review Flowsheet Data     Labs for ITP Cardiac and Pulmonary Rehab Latest Ref Rng & Units 11/23/2018 05/23/2019 04/16/2020 11/03/2020 02/27/2021   Cholestrol 0 - 200 mg/dL 181 135 171 163 155   LDLCALC 0 - 99 mg/dL 103(H) 71 104(H) 99 83   HDL >40 mg/dL 41 40 39(L) 35(L) 45   Trlycerides <150 mg/dL 185(H) 138 159(H) 167(H) 135   Hemoglobin A1c 4.8 - 5.6 % 5.1 - - - -        Exercise Target Goals: Exercise Program Goal: Individual exercise prescription set using results from initial 6 min walk test and THRR while considering  patients activity barriers and safety.   Exercise Prescription Goal: Initial exercise prescription builds to 30-45 minutes a day of aerobic activity, 2-3 days per week.  Home exercise guidelines will be given to patient during program as part of exercise prescription that the participant will acknowledge.   Education: Aerobic  Exercise: - Group verbal and visual presentation on the components of exercise prescription. Introduces F.I.T.T principle from ACSM for exercise prescriptions.  Reviews F.I.T.T. principles of aerobic exercise including progression. Written material given at graduation. Flowsheet Row Cardiac Rehab from 05/07/2021 in [facility] Cardiac and Pulmonary Rehab  Education need identified 04/30/21       Education: Resistance Exercise: - Group verbal and visual presentation on the components of exercise prescription. Introduces F.I.T.T principle from ACSM for exercise prescriptions  Reviews F.I.T.T. principles of resistance exercise including progression. Written material given at graduation.    Education: Exercise &  Equipment Safety: - Individual verbal instruction and demonstration of equipment use and safety with use of the equipment. Flowsheet Row Cardiac Rehab from 05/07/2021 in [facility] Cardiac and Pulmonary Rehab  Date 04/30/21  Educator [name]  Instruction Review Code 1- Verbalizes Understanding       Education: Exercise Physiology & General Exercise Guidelines: - Group verbal and written instruction with models to review the exercise physiology of the cardiovascular system and associated critical values. Provides general exercise guidelines with specific guidelines to those with heart or lung disease.    Education: Flexibility, Balance, Mind/Body Relaxation: - Group verbal and visual presentation with interactive activity on the components of exercise prescription. Introduces F.I.T.T principle from ACSM for exercise prescriptions. Reviews F.I.T.T. principles of flexibility and balance exercise training including progression. Also discusses the mind body connection.  Reviews various relaxation techniques to help reduce and manage stress (i.e. Deep breathing, progressive muscle relaxation, and visualization). Balance handout provided to take home. Written material given at graduation.   Activity Barriers & Risk Stratification:  Activity Barriers & Cardiac Risk Stratification - 04/30/21 1137       Activity Barriers & Cardiac Risk Stratification   Activity Barriers Other (comment);Back Problems;Right Knee Replacement;Deconditioning;Muscular Weakness;Shortness of Breath;Balance Concerns;History of Falls;Assistive Device    Comments bilateral foot drop- wears braces, significant fatigue since MI    Cardiac Risk Stratification High             6 Minute Walk:  6 Minute Walk     Row Name 04/30/21 1137         6 Minute Walk   Phase Initial     Distance 594 feet     Walk Time 6 minutes     # of Rest Breaks 0     MPH 1.13     METS 0.98     RPE 13     Perceived Dyspnea  1     VO2 Peak 3.42      Symptoms Yes (comment)     Comments low back pain 2/10, SOB     Resting HR 58 bpm     Resting BP 126/64     Resting Oxygen Saturation  99 %     Exercise Oxygen Saturation  during 6 min walk 95 %     Max Ex. HR 84 bpm     Max Ex. BP 132/62     2 Minute Post BP 124/62              Oxygen Initial Assessment:   Oxygen Re-Evaluation:   Oxygen Discharge (Final Oxygen Re-Evaluation):   Initial Exercise Prescription:  Initial Exercise Prescription - 04/30/21 1100       Date of Initial Exercise RX and Referring Provider   Date 04/30/21    Referring Provider Donnelly Angelica MD      Oxygen   Maintain Oxygen Saturation 88% or higher  Treadmill   MPH 1    Grade 0.5    Minutes 15    METs 1.8      NuStep   Level 1    SPM 80    Minutes 15    METs 1.5      T5 Nustep   Level 1    SPM 80    Minutes 15    METs 1.5      Track   Laps 15    Minutes 15    METs 1.82      Prescription Details   Frequency (times per week) 2    Duration Progress to 30 minutes of continuous aerobic without signs/symptoms of physical distress      Intensity   THRR 40-80% of Max Heartrate 92-125    Ratings of Perceived Exertion 11-13    Perceived Dyspnea 0-4      Progression   Progression Continue to progress workloads to maintain intensity without signs/symptoms of physical distress.      Resistance Training   Training Prescription Yes    Weight 3 lb    Reps 10-15             Perform Capillary Blood Glucose checks as needed.  Exercise Prescription Changes:   Exercise Prescription Changes     Row Name 04/30/21 1100 05/11/21 1600           Response to Exercise   Blood Pressure (Admit) 126/64 118/66      Blood Pressure (Exercise) 132/62 118/68      Blood Pressure (Exit) 124/62 116/64      Heart Rate (Admit) 58 bpm 64 bpm      Heart Rate (Exercise) 84 bpm 81 bpm      Heart Rate (Exit) 62 bpm 68 bpm      Oxygen Saturation (Admit) 99 % --      Oxygen Saturation  (Exercise) 95 % --      Rating of Perceived Exertion (Exercise) 13 15      Perceived Dyspnea (Exercise) 1 --      Symptoms SOB, low back pain 2/10 none      Comments walk test results second full day of exercise      Duration -- Progress to 30 minutes of  aerobic without signs/symptoms of physical distress      Intensity -- THRR unchanged        Progression   Progression -- Continue to progress workloads to maintain intensity without signs/symptoms of physical distress.      Average METs -- 1.5        Resistance Training   Training Prescription -- Yes      Weight -- 3 lb      Reps -- 10-15        Interval Training   Interval Training -- No        Treadmill   MPH -- 0.7      Grade -- 0      Minutes -- 15      METs -- 1.5        NuStep   Level -- 2      Minutes -- 15      METs -- 1.3        T5 Nustep   Level -- 2      Minutes -- 15      METs -- 1.5        Oxygen   Maintain Oxygen Saturation -- 88% or higher  Exercise Comments:   Exercise Comments     Row Name 05/05/21 [phone]           Exercise Comments First full day of exercise!  Patient was oriented to gym and equipment including functions, settings, policies, and procedures.  Patient's individual exercise prescription and treatment plan were reviewed.  All starting workloads were established based on the results of the 6 minute walk test done at initial orientation visit.  The plan for exercise progression was also introduced and progression will be customized based on patient's performance and goals.                Exercise Goals and Review:   Exercise Goals     Row Name 04/30/21 1140             Exercise Goals   Increase Physical Activity Yes       Intervention Provide advice, education, support and counseling about physical activity/exercise needs.;Develop an individualized exercise prescription for aerobic and resistive training based on initial evaluation findings, risk  stratification, comorbidities and participant's personal goals.       Expected Outcomes Short Term: Attend rehab on a regular basis to increase amount of physical activity.;Long Term: Add in home exercise to make exercise part of routine and to increase amount of physical activity.;Long Term: Exercising regularly at least 3-5 days a week.       Increase Strength and Stamina Yes       Intervention Provide advice, education, support and counseling about physical activity/exercise needs.;Develop an individualized exercise prescription for aerobic and resistive training based on initial evaluation findings, risk stratification, comorbidities and participant's personal goals.       Expected Outcomes Short Term: Increase workloads from initial exercise prescription for resistance, speed, and METs.;Short Term: Perform resistance training exercises routinely during rehab and add in resistance training at home;Long Term: Improve cardiorespiratory fitness, muscular endurance and strength as measured by increased METs and functional capacity (6MWT)       Able to understand and use rate of perceived exertion (RPE) scale Yes       Intervention Provide education and explanation on how to use RPE scale       Expected Outcomes Short Term: Able to use RPE daily in rehab to express subjective intensity level;Long Term:  Able to use RPE to guide intensity level when exercising independently       Able to understand and use Dyspnea scale Yes       Intervention Provide education and explanation on how to use Dyspnea scale       Expected Outcomes Short Term: Able to use Dyspnea scale daily in rehab to express subjective sense of shortness of breath during exertion;Long Term: Able to use Dyspnea scale to guide intensity level when exercising independently       Knowledge and understanding of Target Heart Rate Range (THRR) Yes       Intervention Provide education and explanation of THRR including how the numbers were predicted  and where they are located for reference       Expected Outcomes Short Term: Able to state/look up THRR;Long Term: Able to use THRR to govern intensity when exercising independently;Short Term: Able to use daily as guideline for intensity in rehab       Able to check pulse independently Yes       Intervention Provide education and demonstration on how to check pulse in carotid and radial arteries.;Review the importance of being able to check your  own pulse for safety during independent exercise       Expected Outcomes Short Term: Able to explain why pulse checking is important during independent exercise;Long Term: Able to check pulse independently and accurately       Understanding of Exercise Prescription Yes       Intervention Provide education, explanation, and written materials on patient's individual exercise prescription       Expected Outcomes Short Term: Able to explain program exercise prescription;Long Term: Able to explain home exercise prescription to exercise independently                Exercise Goals Re-Evaluation :  Exercise Goals Re-Evaluation     Row Name 05/05/21 0929 05/11/21 1602           Exercise Goal Re-Evaluation   Exercise Goals Review Increase Physical Activity;Able to understand and use rate of perceived exertion (RPE) scale;Knowledge and understanding of Target Heart Rate Range (THRR);Understanding of Exercise Prescription;Increase Strength and Stamina;Able to understand and use Dyspnea scale;Able to check pulse independently Increase Physical Activity;Increase Strength and Stamina;Understanding of Exercise Prescription      Comments Reviewed RPE and dyspnea scales, THR and program prescription with pt today.  Pt voiced understanding and was given a copy of goals to take home. Densil is off to a good start in rehab.  He has completed his first two full days of exercise.  We will continue to monitor his progress.      Expected Outcomes Short: Use RPE daily to  regulate intensity. Long: Follow program prescription in THR. Short: Continue to attend rehab regularly Long: Continue to follow program prescription               Discharge Exercise Prescription (Final Exercise Prescription Changes):  Exercise Prescription Changes - 05/11/21 1600       Response to Exercise   Blood Pressure (Admit) 118/66    Blood Pressure (Exercise) 118/68    Blood Pressure (Exit) 116/64    Heart Rate (Admit) 64 bpm    Heart Rate (Exercise) 81 bpm    Heart Rate (Exit) 68 bpm    Rating of Perceived Exertion (Exercise) 15    Symptoms none    Comments second full day of exercise    Duration Progress to 30 minutes of  aerobic without signs/symptoms of physical distress    Intensity THRR unchanged      Progression   Progression Continue to progress workloads to maintain intensity without signs/symptoms of physical distress.    Average METs 1.5      Resistance Training   Training Prescription Yes    Weight 3 lb    Reps 10-15      Interval Training   Interval Training No      Treadmill   MPH 0.7    Grade 0    Minutes 15    METs 1.5      NuStep   Level 2    Minutes 15    METs 1.3      T5 Nustep   Level 2    Minutes 15    METs 1.5      Oxygen   Maintain Oxygen Saturation 88% or higher             Nutrition:  Target Goals: Understanding of nutrition guidelines, daily intake of sodium <1572m, cholesterol <2043m calories 30% from fat and 7% or less from saturated fats, daily to have 5 or more servings of fruits and vegetables.  Education:  All About Nutrition: -Group instruction provided by verbal, written material, interactive activities, discussions, models, and posters to present general guidelines for heart healthy nutrition including fat, fiber, MyPlate, the role of sodium in heart healthy nutrition, utilization of the nutrition label, and utilization of this knowledge for meal planning. Follow up email sent as well. Written material given  at graduation. Flowsheet Row Cardiac Rehab from 05/07/2021 in [facility] Cardiac and Pulmonary Rehab  Education need identified 04/30/21       Biometrics:  Pre Biometrics - 04/30/21 1141       Pre Biometrics   Height 5' 6.7" (1.694 m)    Weight 198 lb 12.8 oz (90.2 kg)    BMI (Calculated) 31.42    Single Leg Stand 0 seconds              Nutrition Therapy Plan and Nutrition Goals:  Nutrition Therapy & Goals - 05/13/21 1531       Nutrition Therapy   Diet Heart healthy, low Na    Drug/Food Interactions Statins/Certain Fruits    Protein (specify units) 70g    Fiber 30 grams   while symptomatic with loose bowels, limit fiber   Whole Grain Foods 3 servings   while symptomatic with loose bowels, limit fiber   Saturated Fats 16 max. grams    Fruits and Vegetables 8 servings/day    Sodium 2 grams      Personal Nutrition Goals   Nutrition Goal ST: read nutrition labels for key nutrients like salt, saturated fat, trans fat. talk to MD regarding loose bowels. LT: limit saturated fat <16g/day, limit Na <2g/day, eat at least 5 servings of fruit/vegetables per day, include some walnuts or ground flaxseed to help include omega-3 (he does not like fish)    Comments 76 y.o. M admitted to rehab for NSTEMI also presenting with PAD, HTN. PMHx GERD. Relevant medications include lipitor, buspar, protonix. PYP Score: 55. Vegetables & Fruits 4/12. Breads, Grains & Cereals 6/12. Red & Processed Meat 8/12. Poultry 2/2. Fish & Shellfish 0/4. Beans, Nuts & Seeds 1/4. Milk & Dairy Foods 5/6. Toppings, Oils, Seasonings & Salt 14/20. Sweets, Snacks & Restaurant Food 8/14. Beverages 7/10. Stopped adding sugar to coffee and tea. B: nothing L: chicken salad and crackers D: eat out and get vegetables or pizza. 3-4x go out to eat. Pork chops, chicken - chicken and dumplings, chicken salad. He reports using olive garden salad dressing. He reports not adding salt to his meals normally. He eats hamburgers and hot dog every  once in a while - most of the time will eat chicken. He reports enjoying all kinds of vegetables. Drinks: unsweetened iced tea with splenda and sweet n' low. He reports his biggest problem for the last several months has been "loose bowels" - they have given him tests and gave him prilosec which helped, but he is not on it anymore. Recommended revisiting this issue with his MD to see if they can re-prescribe this medication for a new one.  Discussed heart healthy eating.      Intervention Plan   Intervention Prescribe, educate and counsel regarding individualized specific dietary modifications aiming towards targeted core components such as weight, hypertension, lipid management, diabetes, heart failure and other comorbidities.;Nutrition handout(s) given to patient.    Expected Outcomes Short Term Goal: Understand basic principles of dietary content, such as calories, fat, sodium, cholesterol and nutrients.;Short Term Goal: A plan has been developed with personal nutrition goals set during dietitian appointment.;Long Term Goal: Adherence to  prescribed nutrition plan.             Nutrition Assessments:  MEDIFICTS Score Key: ?70 Need to make dietary changes  40-70 Heart Healthy Diet ? 40 Therapeutic Level Cholesterol Diet  Flowsheet Row Cardiac Rehab from 04/30/2021 in [facility] Cardiac and Pulmonary Rehab  Picture Your Plate Total Score on Admission 55      Picture Your Plate Scores: <17 Unhealthy dietary pattern with much room for improvement. 41-50 Dietary pattern unlikely to meet recommendations for good health and room for improvement. 51-60 More healthful dietary pattern, with some room for improvement.  >60 Healthy dietary pattern, although there may be some specific behaviors that could be improved.    Nutrition Goals Re-Evaluation:   Nutrition Goals Discharge (Final Nutrition Goals Re-Evaluation):   Psychosocial: Target Goals: Acknowledge presence or absence of significant  depression and/or stress, maximize coping skills, provide positive support system. Participant is able to verbalize types and ability to use techniques and skills needed for reducing stress and depression.   Education: Stress, Anxiety, and Depression - Group verbal and visual presentation to define topics covered.  Reviews how body is impacted by stress, anxiety, and depression.  Also discusses healthy ways to reduce stress and to treat/manage anxiety and depression.  Written material given at graduation. Flowsheet Row Cardiac Rehab from 05/07/2021 in [facility] Cardiac and Pulmonary Rehab  Education need identified 04/30/21       Education: Sleep Hygiene -Provides group verbal and written instruction about how sleep can affect your health.  Define sleep hygiene, discuss sleep cycles and impact of sleep habits. Review good sleep hygiene tips.    Initial Review & Psychosocial Screening:  Initial Psych Review & Screening - 04/17/21 1306       Initial Review   Current issues with History of Depression      Family Dynamics   Good Support System? Yes   wife     Barriers   Psychosocial barriers to participate in program There are no identifiable barriers or psychosocial needs.;The patient should benefit from training in stress management and relaxation.      Screening Interventions   Interventions Encouraged to exercise;To provide support and resources with identified psychosocial needs;Provide feedback about the scores to participant    Expected Outcomes Short Term goal: Utilizing psychosocial counselor, staff and physician to assist with identification of specific Stressors or current issues interfering with healing process. Setting desired goal for each stressor or current issue identified.;Long Term Goal: Stressors or current issues are controlled or eliminated.;Short Term goal: Identification and review with participant of any Quality of Life or Depression concerns found by scoring the  questionnaire.;Long Term goal: The participant improves quality of Life and PHQ9 Scores as seen by post scores and/or verbalization of changes             Quality of Life Scores:   Quality of Life - 04/30/21 1141       Quality of Life   Select Quality of Life      Quality of Life Scores   Health/Function Pre 14.13 %    Socioeconomic Pre 16 %    Psych/Spiritual Pre 16.29 %    Family Pre 16.4 %    GLOBAL Pre 15.31 %            Scores of 19 and below usually indicate a poorer quality of life in these areas.  A difference of  2-3 points is a clinically meaningful difference.  A difference of 2-3 points  in the total score of the Quality of Life Index has been associated with significant improvement in overall quality of life, self-image, physical symptoms, and general health in studies assessing change in quality of life.  PHQ-9: Recent Review Flowsheet Data     Depression screen [facility] 2/9 04/30/2021 06/23/2020 05/08/2020 05/21/2019 05/18/2018   Decreased Interest 3 0 0 2 0   Down, Depressed, Hopeless 1 0 0 2 0   PHQ - 2 Score 4 0 0 4 0   Altered sleeping 1 - 0 2 0   Tired, decreased energy 1 - 0 2 0   Change in appetite 2 - 0 1 0   Feeling bad or failure about yourself  1 - 0 2 0   Trouble concentrating 1 - 0 1 0   Moving slowly or fidgety/restless 0 - 0 2 0   Suicidal thoughts 0 - 0 1 0   PHQ-9 Score 10 - 0 15 0   Difficult doing work/chores Somewhat difficult - Not difficult at all Somewhat difficult Not difficult at all      Interpretation of Total Score  Total Score Depression Severity:  1-4 = Minimal depression, 5-9 = Mild depression, 10-14 = Moderate depression, 15-19 = Moderately severe depression, 20-27 = Severe depression   Psychosocial Evaluation and Intervention:  Psychosocial Evaluation - 04/17/21 1311       Psychosocial Evaluation & Interventions   Interventions Encouraged to exercise with the program and follow exercise prescription    Comments Mr. Soileau  reports feeling back to his usual self after his MI with stents. He states he is almost returned to his usual schedule. He doesn't report any stressors at this time. He has a history of anxiety/depression per notes but doesn't verbalize it when asked. His wife is his main support system. He is looking forward to starting the program to see what it is all about.    Expected Outcomes Short: attend cardiac rehab for education and exercise. Long: develop and maintain positive self care habits.    Continue Psychosocial Services  Follow up required by staff             Psychosocial Re-Evaluation:   Psychosocial Discharge (Final Psychosocial Re-Evaluation):   Vocational Rehabilitation: Provide vocational rehab assistance to qualifying candidates.   Vocational Rehab Evaluation & Intervention:  Vocational Rehab - 04/17/21 1311       Initial Vocational Rehab Evaluation & Intervention   Assessment shows need for Vocational Rehabilitation No             Education: Education Goals: Education classes will be provided on a variety of topics geared toward better understanding of heart health and risk factor modification. Participant will state understanding/return demonstration of topics presented as noted by education test scores.  Learning Barriers/Preferences:  Learning Barriers/Preferences - 04/17/21 1318       Learning Barriers/Preferences   Learning Barriers None    Learning Preferences Individual Instruction             General Cardiac Education Topics:  AED/CPR: - Group verbal and written instruction with the use of models to demonstrate the basic use of the AED with the basic ABC's of resuscitation.   Anatomy and Cardiac Procedures: - Group verbal and visual presentation and models provide information about basic cardiac anatomy and function. Reviews the testing methods done to diagnose heart disease and the outcomes of the test results. Describes the treatment  choices: Medical Management, Angioplasty, or Coronary Bypass Surgery for treating  various heart conditions including Myocardial Infarction, Angina, Valve Disease, and Cardiac Arrhythmias.  Written material given at graduation.   Medication Safety: - Group verbal and visual instruction to review commonly prescribed medications for heart and lung disease. Reviews the medication, class of the drug, and side effects. Includes the steps to properly store meds and maintain the prescription regimen.  Written material given at graduation.   Intimacy: - Group verbal instruction through game format to discuss how heart and lung disease can affect sexual intimacy. Written material given at graduation..   Know Your Numbers and Heart Failure: - Group verbal and visual instruction to discuss disease risk factors for cardiac and pulmonary disease and treatment options.  Reviews associated critical values for Overweight/Obesity, Hypertension, Cholesterol, and Diabetes.  Discusses basics of heart failure: signs/symptoms and treatments.  Introduces Heart Failure Zone chart for action plan for heart failure.  Written material given at graduation. Flowsheet Row Cardiac Rehab from 05/07/2021 in [facility] Cardiac and Pulmonary Rehab  Education need identified 04/30/21       Infection Prevention: - Provides verbal and written material to individual with discussion of infection control including proper hand washing and proper equipment cleaning during exercise session. Flowsheet Row Cardiac Rehab from 05/07/2021 in [facility] Cardiac and Pulmonary Rehab  Date 04/30/21  Educator [facility]  Instruction Review Code 1- Verbalizes Understanding       Falls Prevention: - Provides verbal and written material to individual with discussion of falls prevention and safety. Flowsheet Row Cardiac Rehab from 05/07/2021 in [facility] Cardiac and Pulmonary Rehab  Date 04/30/21  Educator [facility]  Instruction Review Code 1- Verbalizes Understanding        Other: -Provides group and verbal instruction on various topics (see comments)   Knowledge Questionnaire Score:  Knowledge Questionnaire Score - 04/30/21 1142       Knowledge Questionnaire Score   Pre Score 19/26             Core Components/Risk Factors/Patient Goals at Admission:  Personal Goals and Risk Factors at Admission - 04/30/21 1142       Core Components/Risk Factors/Patient Goals on Admission    Weight Management Yes;Obesity;Weight Loss    Intervention Weight Management: Develop a combined nutrition and exercise program designed to reach desired caloric intake, while maintaining appropriate intake of nutrient and fiber, sodium and fats, and appropriate energy expenditure required for the weight goal.;Weight Management: Provide education and appropriate resources to help participant work on and attain dietary goals.;Obesity: Provide education and appropriate resources to help participant work on and attain dietary goals.;Weight Management/Obesity: Establish reasonable short term and long term weight goals.    Admit Weight 198 lb 12.8 oz (90.2 kg)    Goal Weight: Short Term 195 lb (88.5 kg)    Goal Weight: Long Term 190 lb (86.2 kg)    Expected Outcomes Short Term: Continue to assess and modify interventions until short term weight is achieved;Long Term: Adherence to nutrition and physical activity/exercise program aimed toward attainment of established weight goal;Weight Loss: Understanding of general recommendations for a balanced deficit meal plan, which promotes 1-2 lb weight loss per week and includes a negative energy balance of [phone] kcal/d;Understanding recommendations for meals to include 15-35% energy as protein, 25-35% energy from fat, 35-60% energy from carbohydrates, less than 261m of dietary cholesterol, 20-35 gm of total fiber daily;Understanding of distribution of calorie intake throughout the day with the consumption of 4-5 meals/snacks    Improve  shortness of breath with ADL's Yes  Intervention Provide education, individualized exercise plan and daily activity instruction to help decrease symptoms of SOB with activities of daily living.    Expected Outcomes Short Term: Improve cardiorespiratory fitness to achieve a reduction of symptoms when performing ADLs;Long Term: Be able to perform more ADLs without symptoms or delay the onset of symptoms    Hypertension Yes    Intervention Provide education on lifestyle modifcations including regular physical activity/exercise, weight management, moderate sodium restriction and increased consumption of fresh fruit, vegetables, and low fat dairy, alcohol moderation, and smoking cessation.;Monitor prescription use compliance.    Expected Outcomes Short Term: Continued assessment and intervention until BP is < 140/21m HG in hypertensive participants. < 130/864mHG in hypertensive participants with diabetes, heart failure or chronic kidney disease.;Long Term: Maintenance of blood pressure at goal levels.    Lipids Yes    Intervention Provide education and support for participant on nutrition & aerobic/resistive exercise along with prescribed medications to achieve LDL <7051mHDL >31m64m  Expected Outcomes Short Term: Participant states understanding of desired cholesterol values and is compliant with medications prescribed. Participant is following exercise prescription and nutrition guidelines.;Long Term: Cholesterol controlled with medications as prescribed, with individualized exercise RX and with personalized nutrition plan. Value goals: LDL < 70mg44mL > 40 mg.             Education:Diabetes - Individual verbal and written instruction to review signs/symptoms of diabetes, desired ranges of glucose level fasting, after meals and with exercise. Acknowledge that pre and post exercise glucose checks will be done for 3 sessions at entry of program.   Core Components/Risk Factors/Patient Goals  Review:    Core Components/Risk Factors/Patient Goals at Discharge (Final Review):    ITP Comments:  ITP Comments     Row Name 04/17/21 1310 04/30/21 1137 05/05/21 0929 05/13/21 1602 05/20/21 0926   ITP Comments Initial telephone orientation completed. Diagnosis can be found in [facility]. EP orientation scheduled for Thursday 2/2 at 9:30. Completed 6MWT and gym orientation. Initial ITP created and sent for review to Dr. Mark Emily Filbertical Director. First full day of exercise!  Patient was oriented to gym and equipment including functions, settings, policies, and procedures.  Patient's individual exercise prescription and treatment plan were reviewed.  All starting workloads were established based on the results of the 6 minute walk test done at initial orientation visit.  The plan for exercise progression was also introduced and progression will be customized based on patient's performance and goals. Completed initial RD consultation 30 Day review completed. Medical Director ITP review done, changes made as directed, and signed approval by Medical Director.            Comments:

## 2021-05-21 ENCOUNTER — Other Ambulatory Visit: Payer: Self-pay

## 2021-05-21 DIAGNOSIS — Z955 Presence of coronary angioplasty implant and graft: Secondary | ICD-10-CM

## 2021-05-21 DIAGNOSIS — I252 Old myocardial infarction: Secondary | ICD-10-CM | POA: Diagnosis not present

## 2021-05-21 DIAGNOSIS — I214 Non-ST elevation (NSTEMI) myocardial infarction: Secondary | ICD-10-CM

## 2021-05-21 NOTE — [place] Notes (Signed)
Daily Session Note  Patient Details  Name: TARANCE BALAN MRN: [id number] Date of Birth: 1945-07-24 Referring Provider:   Flowsheet Row Cardiac Rehab from 04/30/2021 in [facility] Cardiac and Pulmonary Rehab  Referring Provider Donnelly Angelica MD       Encounter Date: 05/21/2021  Check In:  Session Check In - 05/21/21 0918       Check-In   Supervising physician immediately available to respond to emergencies See telemetry face sheet for immediately available ER MD    Location [facility]-Cardiac & Pulmonary Rehab    Staff Present Birdie Sons, MPA, Mauricia Area, BS, ACSM CEP, Exercise Physiologist;Melissa Caiola, RDN, LDN    Virtual Visit No    Medication changes reported     Yes    Comments taking 666m ibuprofen prn for pain    Fall or balance concerns reported    No    Warm-up and Cool-down Performed on first and last piece of equipment    Resistance Training Performed Yes    VAD Patient? No    PAD/SET Patient? No      Pain Assessment   Currently in Pain? No/denies                Social History   Tobacco Use  Smoking Status Former   Packs/day: 2.00   Years: 30.00   Pack years: 60.00   Types: Cigarettes  Smokeless Tobacco Never  Tobacco Comments   quit in 1991-1992    Goals Met:  Independence with exercise equipment Exercise tolerated well No report of concerns or symptoms today Strength training completed today  Goals Unmet:  Not Applicable  Comments: Pt able to follow exercise prescription today without complaint.  Will continue to monitor for progression.    Dr. MEmily Filbertis Medical Director for [facility]  Dr. FOttie Glazieris Medical Director [facility].

## 2021-05-26 ENCOUNTER — Other Ambulatory Visit: Payer: Self-pay

## 2021-05-26 DIAGNOSIS — I214 Non-ST elevation (NSTEMI) myocardial infarction: Secondary | ICD-10-CM

## 2021-05-26 DIAGNOSIS — Z955 Presence of coronary angioplasty implant and graft: Secondary | ICD-10-CM

## 2021-05-26 DIAGNOSIS — I252 Old myocardial infarction: Secondary | ICD-10-CM | POA: Diagnosis not present

## 2021-05-26 NOTE — [place] Notes (Signed)
Daily Session Note  Patient Details  Name: Justin Warren MRN: [id number] Date of Birth: 06/28/1945 Referring Provider:   Flowsheet Row Cardiac Rehab from 04/30/2021 in [facility] Cardiac and Pulmonary Rehab  Referring Provider Donnelly Angelica MD       Encounter Date: 05/26/2021  Check In:  Session Check In - 05/26/21 0909       Check-In   Supervising physician immediately available to respond to emergencies See telemetry face sheet for immediately available ER MD    Location [facility]-Cardiac & Pulmonary Rehab    Staff Present Birdie Sons, MPA, RN;Melissa Bassett, RDN, Rowe Pavy, BA, ACSM CEP, Exercise Physiologist    Virtual Visit No    Medication changes reported     No    Fall or balance concerns reported    No    Warm-up and Cool-down Performed on first and last piece of equipment    Resistance Training Performed Yes    VAD Patient? No    PAD/SET Patient? No      Pain Assessment   Currently in Pain? No/denies                Social History   Tobacco Use  Smoking Status Former   Packs/day: 2.00   Years: 30.00   Pack years: 60.00   Types: Cigarettes  Smokeless Tobacco Never  Tobacco Comments   quit in 1991-1992    Goals Met:  Independence with exercise equipment Exercise tolerated well No report of concerns or symptoms today Strength training completed today  Goals Unmet:  Not Applicable  Comments: Pt able to follow exercise prescription today without complaint.  Will continue to monitor for progression.  Reviewed home exercise with pt today.  Pt plans to walk and go to [employer] with his [place] for exercise.  Reviewed THR, pulse, RPE, sign and symptoms, pulse oximetery and when to call 911 or MD.  Also discussed weather considerations and indoor options.  Pt voiced understanding.   Dr. Emily Filbert is Medical Director for [place].  Dr. Ottie Glazier is Medical Director for [facility] Pulmonary Rehabilitation.

## 2021-05-28 DIAGNOSIS — R001 Bradycardia, unspecified: Secondary | ICD-10-CM | POA: Diagnosis not present

## 2021-05-28 DIAGNOSIS — I480 Paroxysmal atrial fibrillation: Secondary | ICD-10-CM | POA: Diagnosis not present

## 2021-05-28 DIAGNOSIS — I442 Atrioventricular block, complete: Secondary | ICD-10-CM | POA: Diagnosis not present

## 2021-05-28 DIAGNOSIS — R0789 Other chest pain: Secondary | ICD-10-CM | POA: Diagnosis not present

## 2021-05-28 DIAGNOSIS — I739 Peripheral vascular disease, unspecified: Secondary | ICD-10-CM | POA: Diagnosis not present

## 2021-05-28 DIAGNOSIS — I214 Non-ST elevation (NSTEMI) myocardial infarction: Secondary | ICD-10-CM | POA: Diagnosis not present

## 2021-06-01 ENCOUNTER — Telehealth: Payer: Self-pay | Admitting: Family Medicine

## 2021-06-01 ENCOUNTER — Telehealth: Payer: Self-pay

## 2021-06-01 NOTE — Telephone Encounter (Signed)
[facility] faxed refill request for the following medications: ? ?omeprazole (PRILOSEC) 40 MG capsule  ? ?Please advise. ? ? ? ?

## 2021-06-01 NOTE — Telephone Encounter (Signed)
Copied from [place] [id number]. Topic: General - Other ?>> Jun 01, 2021 11:37 AM Tessa Lerner A wrote: ?Reason for CRM: The patient has called back to share that they are taking 20 MG Pantoprazole prescribed by Dr. Donnelly Angelica  ? ?The patient is also taking 25 MG Metoprolol previously prescribed by Dr. Bartholome Bill  ? ?Please contact further when possible ?

## 2021-06-02 ENCOUNTER — Other Ambulatory Visit: Payer: Self-pay

## 2021-06-02 DIAGNOSIS — R12 Heartburn: Secondary | ICD-10-CM

## 2021-06-02 MED ORDER — OMEPRAZOLE 40 MG PO CPDR: 40.0000 mg | DELAYED_RELEASE_CAPSULE | Freq: Every day | ORAL | 1 refills | Status: DC

## 2021-06-03 ENCOUNTER — Other Ambulatory Visit: Payer: Self-pay

## 2021-06-03 ENCOUNTER — Encounter: Payer: Self-pay | Admitting: Family Medicine

## 2021-06-03 ENCOUNTER — Ambulatory Visit (INDEPENDENT_AMBULATORY_CARE_PROVIDER_SITE_OTHER): Payer: Medicare HMO | Admitting: Family Medicine

## 2021-06-03 VITALS — BP 110/67 | HR 64 | Temp 98.0°F | Resp 15 | Wt 193.6 lb

## 2021-06-03 DIAGNOSIS — K51911 Ulcerative colitis, unspecified with rectal bleeding: Secondary | ICD-10-CM

## 2021-06-03 DIAGNOSIS — K219 Gastro-esophageal reflux disease without esophagitis: Secondary | ICD-10-CM | POA: Insufficient documentation

## 2021-06-03 MED ORDER — BALSALAZIDE DISODIUM 750 MG PO CAPS: 2250.0000 mg | ORAL_CAPSULE | Freq: Three times a day (TID) | ORAL | 0 refills | Status: DC

## 2021-06-03 NOTE — [place] Notes (Signed)
?  ? ? ?Established patient visit ? ? ?Patient: Justin Warren   DOB: [birth year]   76 y.o. Male  MRN: [id number] ?Visit Date: 06/03/2021 ? ?Today's healthcare provider: Gwyneth Sprout, FNP  ? ?Re-Introduced to nurse practitioner role and practice setting.  All questions answered.  Discussed provider/patient relationship and expectations. ? ? ?Chief Complaint  ?Patient presents with  ? Diarrhea  ? ?Subjective  ?  ?Diarrhea  ?This is a recurrent problem. The current episode started more than 1 month ago. The problem occurs 5 to 10 times per day. The problem has been rapidly improving. The stool consistency is described as Watery. The patient states that diarrhea does not awaken him from sleep. Associated symptoms include weight loss. Pertinent negatives include no abdominal pain, arthralgias, bloating, chills, coughing, fever, headaches, increased  flatus, myalgias, sweats, URI or vomiting. Associated symptoms comments: belching. Exacerbated by: eating.   ? ? ?Medications: ?Outpatient Medications Prior to Visit  ?Medication Sig  ? acetaminophen (TYLENOL) 650 MG CR tablet Take 650 mg by mouth every 8 (eight) hours as needed for pain.  ? atorvastatin (LIPITOR) 80 MG tablet Take 1 tablet (80 mg total) by mouth daily.  ? busPIRone (BUSPAR) 15 MG tablet Take 1 tablet (15 mg total) by mouth 2 (two) times daily.  ? clopidogrel (PLAVIX) 75 MG tablet Take 1 tablet (75 mg total) by mouth daily with breakfast.  ? dicyclomine (BENTYL) 20 MG tablet Take 1 tablet (20 mg total) by mouth 3 (three) times daily before meals.  ? ELIQUIS 5 MG TABS tablet SMARTSIG:1 Tablet(s) By Mouth Every 12 Hours  ? FLUoxetine (PROZAC) 20 MG capsule Take 1 capsule (20 mg total) by mouth daily.  ? losartan (COZAAR) 50 MG tablet Take 1 tablet (50 mg total) by mouth daily.  ? metoprolol succinate (TOPROL-XL) 25 MG 24 hr tablet Take 25 mg by mouth daily.  ? omeprazole (PRILOSEC) 40 MG capsule Take 1 capsule (40 mg total) by mouth daily.  ? pantoprazole  (PROTONIX) 20 MG tablet Take 20 mg by mouth daily.  ? tadalafil (CIALIS) 5 MG tablet Take 1 tablet (5 mg total) by mouth daily as needed for erectile dysfunction.  ? ?No facility-administered medications prior to visit.  ? ? ?Review of Systems  ?Constitutional:  Positive for weight loss. Negative for chills and fever.  ?Respiratory:  Negative for cough.   ?Gastrointestinal:  Positive for diarrhea. Negative for abdominal pain, bloating, flatus and vomiting.  ?Musculoskeletal:  Negative for arthralgias and myalgias.  ?Neurological:  Negative for headaches.  ? ? ?  Objective  ?  ?BP 110/67   Pulse 64   Temp 98 ?F (36.7 ?C) (Oral)   Resp 15   Wt 193 lb 9.6 oz (87.8 kg)   SpO2 96%   BMI 30.60 kg/m?  ? ? ?Physical Exam ?Vitals and nursing note reviewed.  ?Constitutional:   ?   Appearance: Normal appearance. He is obese.  ?HENT:  ?   Head: Normocephalic and atraumatic.  ?Eyes:  ?   Pupils: Pupils are equal, round, and reactive to light.  ?Cardiovascular:  ?   Rate and Rhythm: Normal rate and regular rhythm.  ?   Pulses: Normal pulses.  ?   Heart sounds: Normal heart sounds.  ?Pulmonary:  ?   Effort: Pulmonary effort is normal.  ?   Breath sounds: Normal breath sounds.  ?Musculoskeletal:     ?   General: Normal range of motion.  ?   Cervical back: Normal  range of motion.  ?Skin: ?   General: Skin is warm and dry.  ?   Capillary Refill: Capillary refill takes less than 2 seconds.  ?Neurological:  ?   General: No focal deficit present.  ?   Mental Status: He is alert and oriented to person, place, and time. Mental status is at baseline.  ?Psychiatric:     ?   Mood and Affect: Mood normal.     ?   Behavior: Behavior normal.     ?   Thought Content: Thought content normal.     ?   Judgment: Judgment normal.  ?  ? ?No results found for any visits on 06/03/21. ? Assessment & Plan  ?  ? ?Problem List Items Addressed This Visit   ? ?  ? Digestive  ? Gastroesophageal reflux disease without esophagitis  ?  Chronic,  stable ?Remains on PPI ?Encourage 3 meals a day ?Handout provided on GERD friendly foods ?  ?  ? Relevant Medications  ? balsalazide (COLAZAL) 750 MG capsule  ? Ulcerative colitis with rectal bleeding ([place]) - Primary  ?  -concern for UC with urgency, leaking, frequency, dumping after each meal ?-is on PPI ?-has had colonoscopy- treated 2 polyps ?-is on probiotics ?-trial of medications, discussed frequency of dosing ?-handout provided; pt has been doing research online as well ?-encourage referral back to GI to assist if medication does not help ?  ?  ? Relevant Medications  ? balsalazide (COLAZAL) 750 MG capsule  ? ? ? ?Return for annual examination-AWV with [place].  ?   ? ?I, Gwyneth Sprout, FNP, have reviewed all documentation for this visit. The documentation on 06/03/21 for the exam, diagnosis, procedures, and orders are all accurate and complete. ? ? ?[employer] as a Education administrator for Gwyneth Sprout, FNP.,have documented all relevant documentation on the behalf of Gwyneth Sprout, FNP,as directed by  Gwyneth Sprout, FNP while in the presence of Gwyneth Sprout, FNP.  ? ?Gwyneth Sprout, FNP  ?[place] ?[phone] (phone) ?[phone] (fax) ? ?[facility] ?

## 2021-06-03 NOTE — Assessment & Plan Note (Signed)
Chronic, stable ?Remains on PPI ?Encourage 3 meals a day ?Handout provided on GERD friendly foods ?

## 2021-06-03 NOTE — Assessment & Plan Note (Signed)
-  concern for UC with urgency, leaking, frequency, dumping after each meal ?-is on PPI ?-has had colonoscopy- treated 2 polyps ?-is on probiotics ?-trial of medications, discussed frequency of dosing ?-handout provided; pt has been doing research online as well ?-encourage referral back to GI to assist if medication does not help ?

## 2021-06-04 ENCOUNTER — Encounter: Payer: Medicare HMO | Attending: Cardiology

## 2021-06-04 ENCOUNTER — Encounter: Payer: Self-pay | Admitting: *Deleted

## 2021-06-04 ENCOUNTER — Telehealth: Payer: Self-pay | Admitting: *Deleted

## 2021-06-04 DIAGNOSIS — Z955 Presence of coronary angioplasty implant and graft: Secondary | ICD-10-CM

## 2021-06-04 DIAGNOSIS — I214 Non-ST elevation (NSTEMI) myocardial infarction: Secondary | ICD-10-CM | POA: Insufficient documentation

## 2021-06-04 NOTE — Telephone Encounter (Signed)
Called to check on pt as he has been calling out. Left message.  ?

## 2021-06-08 ENCOUNTER — Telehealth: Payer: Self-pay

## 2021-06-08 NOTE — Telephone Encounter (Signed)
Justin Warren called to say he plans to return tomorrow.  His Dr . Rozell Searing he has ulcerative colitis and he feels better since being on meds for that ?

## 2021-06-09 ENCOUNTER — Other Ambulatory Visit: Payer: Self-pay

## 2021-06-09 DIAGNOSIS — I214 Non-ST elevation (NSTEMI) myocardial infarction: Secondary | ICD-10-CM

## 2021-06-09 DIAGNOSIS — Z955 Presence of coronary angioplasty implant and graft: Secondary | ICD-10-CM

## 2021-06-09 NOTE — [place] Notes (Signed)
Daily Session Note ? ?Patient Details  ?Name: Justin Warren ?MRN: [id number] ?Date of Birth: [birth year] ?Referring Provider:   ?Flowsheet Row Cardiac Rehab from 04/30/2021 in [facility] Cardiac and Pulmonary Rehab  ?Referring Provider Donnelly Angelica MD  ? ?  ? ? ?Encounter Date: 06/09/2021 ? ?Check In: ? Session Check In - 06/09/21 0925   ? ?  ? Check-In  ? Supervising physician immediately available to respond to emergencies See telemetry face sheet for immediately available ER MD   ? Location [facility]-Cardiac & Pulmonary Rehab   ? Staff Present Birdie Sons, MPA, RN;Amanda Sommer, BA, ACSM CEP, Exercise Physiologist;Jessica Luan Pulling, MA, RCEP, CCRP, CCET   ? Virtual Visit No   ? Medication changes reported     Yes   ? Comments new med for ulcerative colitis   ? Fall or balance concerns reported    No   ? Warm-up and Cool-down Performed on first and last piece of equipment   ? Resistance Training Performed Yes   ? VAD Patient? No   ? PAD/SET Patient? No   ?  ? Pain Assessment  ? Currently in Pain? No/denies   ? ?  ?  ? ?  ? ? ? ? ? ?Social History  ? ?Tobacco Use  ?Smoking Status Former  ? Packs/day: 2.00  ? Years: 30.00  ? Pack years: 60.00  ? Types: Cigarettes  ?Smokeless Tobacco Never  ?Tobacco Comments  ? quit in 1991-1992  ? ? ?Goals Met:  ?Independence with exercise equipment ?Exercise tolerated well ?No report of concerns or symptoms today ?Strength training completed today ? ?Goals Unmet:  ?Not Applicable ? ?Comments: Pt able to follow exercise prescription today without complaint.  Will continue to monitor for progression. ? ? ? ?Dr. Emily Filbert is Medical Director for [place].  ?Dr. Ottie Glazier is Medical Director for [facility] Pulmonary Rehabilitation. ?

## 2021-06-11 ENCOUNTER — Other Ambulatory Visit: Payer: Self-pay

## 2021-06-11 DIAGNOSIS — I214 Non-ST elevation (NSTEMI) myocardial infarction: Secondary | ICD-10-CM | POA: Diagnosis not present

## 2021-06-11 DIAGNOSIS — Z955 Presence of coronary angioplasty implant and graft: Secondary | ICD-10-CM | POA: Diagnosis not present

## 2021-06-11 NOTE — [place] Notes (Signed)
Daily Session Note ? ?Patient Details  ?Name: Justin Warren ?MRN: [id number] ?Date of Birth: [birth year] ?Referring Provider:   ?Flowsheet Row Cardiac Rehab from 04/30/2021 in [facility] Cardiac and Pulmonary Rehab  ?Referring Provider Donnelly Angelica MD  ? ?  ? ? ?Encounter Date: 06/11/2021 ? ?Check In: ? Session Check In - 06/11/21 0916   ? ?  ? Check-In  ? Supervising physician immediately available to respond to emergencies See telemetry face sheet for immediately available ER MD   ? Location [facility]-Cardiac & Pulmonary Rehab   ? Staff Present Birdie Sons, MPA, RN;Celestine Bougie Amedeo Plenty, BS, ACSM CEP, Exercise Physiologist;Jessica Luan Pulling, MA, RCEP, CCRP, CCET   ? Virtual Visit No   ? Medication changes reported     No   ? Fall or balance concerns reported    No   ? Warm-up and Cool-down Performed on first and last piece of equipment   ? Resistance Training Performed Yes   ? VAD Patient? No   ? PAD/SET Patient? No   ?  ? Pain Assessment  ? Currently in Pain? No/denies   ? ?  ?  ? ?  ? ? ? ? ? ?Social History  ? ?Tobacco Use  ?Smoking Status Former  ? Packs/day: 2.00  ? Years: 30.00  ? Pack years: 60.00  ? Types: Cigarettes  ?Smokeless Tobacco Never  ?Tobacco Comments  ? quit in 1991-1992  ? ? ?Goals Met:  ?Independence with exercise equipment ?Exercise tolerated well ?No report of concerns or symptoms today ?Strength training completed today ? ?Goals Unmet:  ?Not Applicable ? ?Comments: Pt able to follow exercise prescription today without complaint.  Will continue to monitor for progression. ? ? ? ?Dr. Emily Filbert is Medical Director for [place].  ?Dr. Ottie Glazier is Medical Director for [facility] Pulmonary Rehabilitation. ?

## 2021-06-15 ENCOUNTER — Other Ambulatory Visit: Payer: Self-pay | Admitting: Family Medicine

## 2021-06-15 ENCOUNTER — Telehealth: Payer: Self-pay

## 2021-06-15 ENCOUNTER — Telehealth: Payer: Self-pay | Admitting: Family Medicine

## 2021-06-15 ENCOUNTER — Ambulatory Visit: Payer: Self-pay | Admitting: *Deleted

## 2021-06-15 DIAGNOSIS — K589 Irritable bowel syndrome without diarrhea: Secondary | ICD-10-CM

## 2021-06-15 DIAGNOSIS — K51911 Ulcerative colitis, unspecified with rectal bleeding: Secondary | ICD-10-CM

## 2021-06-15 DIAGNOSIS — R12 Heartburn: Secondary | ICD-10-CM

## 2021-06-15 MED ORDER — DICYCLOMINE HCL 20 MG PO TABS: 20.0000 mg | ORAL_TABLET | Freq: Three times a day (TID) | ORAL | 1 refills | Status: DC

## 2021-06-15 MED ORDER — OMEPRAZOLE 40 MG PO CPDR: 40.0000 mg | DELAYED_RELEASE_CAPSULE | Freq: Every day | ORAL | 1 refills | Status: DC

## 2021-06-15 NOTE — Telephone Encounter (Signed)
Copied from [place] [id number]. Topic: General - Other ?>> Jun 15, 2021  7:42 AM Leward Quan A wrote: ?Reason for CRM: Patient called in to inform Tally Joe that Rx balsalazide (COLAZAL) 750 MG capsule ordered is $300 and he will not be able to get it but states that he was on omeprazole (PRILOSEC) 40 MG capsule at one point and it helped with whatever he got going on. Asking for a call back from Ms Rollene Rotunda at Ph# [phone] ?

## 2021-06-15 NOTE — Telephone Encounter (Signed)
Rx was sent  

## 2021-06-15 NOTE — Telephone Encounter (Signed)
Patient returned call regarding omeprazole medication. Reviewed message from Dickie La, NP from today. Patient reports he has not been taking any medication and would like to restart omeprazole. Order written for omeprazole today . Patient report he does not need GI referral. Patient has been seen by GI and completed endo/ coloscopy. Continues with c/o bowels watery. Reports no bleeding and will call back if symptoms worsen.  ?

## 2021-06-15 NOTE — Telephone Encounter (Signed)
CALLED PATIENT NO ANSWER LEFT VOICEMAIL FOR A CALL BACK ?[place] 15 ?

## 2021-06-15 NOTE — Telephone Encounter (Signed)
Scheduled for 10/13/2021

## 2021-06-15 NOTE — Telephone Encounter (Signed)
Patient was advised. Patient wanted to know if he can get refill for Bentyl? Please advise? ?

## 2021-06-16 ENCOUNTER — Other Ambulatory Visit: Payer: Self-pay

## 2021-06-16 DIAGNOSIS — I214 Non-ST elevation (NSTEMI) myocardial infarction: Secondary | ICD-10-CM

## 2021-06-16 DIAGNOSIS — Z955 Presence of coronary angioplasty implant and graft: Secondary | ICD-10-CM

## 2021-06-16 NOTE — Telephone Encounter (Signed)
Spoke with patient on the phone and advised him as below, he states that he could not get appt with GI until end of July, patient states that he cannot wait that long for medication assistance, according to referral note is 10/13/21 for him to be seen, patient wants to know if we can send him to [facility] to be seen sooner? KW ?

## 2021-06-16 NOTE — Progress Notes (Signed)
Daily Session Note ? ?Patient Details  ?Name: Justin Warren ?MRN: [id number] ?Date of Birth: 04/13/1945 ?Referring Provider:   ?Flowsheet Row Cardiac Rehab from 04/30/2021 in [facility] Cardiac and Pulmonary Rehab  ?Referring Provider Orgel, Ryan MD  ? ?  ? ? ?Encounter Date: 06/16/2021 ? ?Check In: ? Session Check In - 06/16/21 0931   ? ?  ? Check-In  ? Supervising physician immediately available to respond to emergencies See telemetry face sheet for immediately available ER MD   ? Location [facility]-Cardiac & Pulmonary Rehab   ? Staff Present  , MPA, RN;Jessica Hawkins, MA, RCEP, CCRP, CCET;Amanda Sommer, BA, ACSM CEP, Exercise Physiologist   ? Virtual Visit No   ? Medication changes reported     No   ? Fall or balance concerns reported    No   ? Warm-up and Cool-down Performed on first and last piece of equipment   ? Resistance Training Performed Yes   ? VAD Patient? No   ? PAD/SET Patient? No   ?  ? Pain Assessment  ? Currently in Pain? No/denies   ? ?  ?  ? ?  ? ? ? ? ? ?Social History  ? ?Tobacco Use  ?Smoking Status Former  ? Packs/day: 2.00  ? Years: 30.00  ? Pack years: 60.00  ? Types: Cigarettes  ?Smokeless Tobacco Never  ?Tobacco Comments  ? quit in 1991-1992  ? ? ?Goals Met:  ?Independence with exercise equipment ?Exercise tolerated well ?No report of concerns or symptoms today ?Strength training completed today ? ?Goals Unmet:  ?Not Applicable ? ?Comments: Pt able to follow exercise prescription today without complaint.  Will continue to monitor for progression. ? ? ? ?Dr. Mark Miller is Medical Director for [facility].  ?Dr. Fuad Aleskerov is Medical Director for [facility]. ?

## 2021-06-17 ENCOUNTER — Encounter: Payer: Self-pay | Admitting: *Deleted

## 2021-06-17 DIAGNOSIS — I214 Non-ST elevation (NSTEMI) myocardial infarction: Secondary | ICD-10-CM

## 2021-06-17 DIAGNOSIS — Z955 Presence of coronary angioplasty implant and graft: Secondary | ICD-10-CM

## 2021-06-17 NOTE — Progress Notes (Signed)
Cardiac Individual Treatment Plan ? ?Patient Details  ?Name: Justin Warren ?MRN: [id number] ?Date of Birth: 1945-05-03 ?Referring Provider:   ?Flowsheet Row Cardiac Rehab from 04/30/2021 in [facility] Cardiac and Pulmonary Rehab  ?Referring Provider Donnelly Angelica MD  ? ?  ? ? ?Initial Encounter Date:  ?Flowsheet Row Cardiac Rehab from 04/30/2021 in [facility] Cardiac and Pulmonary Rehab  ?Date 04/30/21  ? ?  ? ? ?Visit Diagnosis: Status post coronary artery stent placement ? ?NSTEMI (non-ST elevated myocardial infarction) ([place]) ? ?Patient's Home Medications on Admission: ? ?Current Outpatient Medications:  ?  acetaminophen (TYLENOL) 650 MG CR tablet, Take 650 mg by mouth every 8 (eight) hours as needed for pain., Disp: , Rfl:  ?  atorvastatin (LIPITOR) 80 MG tablet, Take 1 tablet (80 mg total) by mouth daily., Disp: 30 tablet, Rfl: 2 ?  busPIRone (BUSPAR) 15 MG tablet, Take 1 tablet (15 mg total) by mouth 2 (two) times daily., Disp: 180 tablet, Rfl: 0 ?  clopidogrel (PLAVIX) 75 MG tablet, Take 1 tablet (75 mg total) by mouth daily with breakfast., Disp: 30 tablet, Rfl: 2 ?  dicyclomine (BENTYL) 20 MG tablet, Take 1 tablet (20 mg total) by mouth 3 (three) times daily before meals., Disp: 90 tablet, Rfl: 1 ?  ELIQUIS 5 MG TABS tablet, SMARTSIG:1 Tablet(s) By Mouth Every 12 Hours, Disp: , Rfl:  ?  FLUoxetine (PROZAC) 20 MG capsule, Take 1 capsule (20 mg total) by mouth daily., Disp: 90 capsule, Rfl: 1 ?  losartan (COZAAR) 50 MG tablet, Take 1 tablet (50 mg total) by mouth daily., Disp: 30 tablet, Rfl: 2 ?  metoprolol succinate (TOPROL-XL) 25 MG 24 hr tablet, Take 25 mg by mouth daily., Disp: , Rfl:  ?  omeprazole (PRILOSEC) 40 MG capsule, Take 1 capsule (40 mg total) by mouth daily., Disp: 90 capsule, Rfl: 1 ?  tadalafil (CIALIS) 5 MG tablet, Take 1 tablet (5 mg total) by mouth daily as needed for erectile dysfunction., Disp: 30 tablet, Rfl: 11 ? ?Past Medical History: ?Past Medical History:  ?Diagnosis Date  ? Anxiety   ? Bradycardia    ? GERD (gastroesophageal reflux disease)   ? History of kidney stones   ? Hyperlipidemia   ? Hypertension   ? Kidney stone   ? Neuropathy   ? ? ?Tobacco Use: ?Social History  ? ?Tobacco Use  ?Smoking Status Former  ? Packs/day: 2.00  ? Years: 30.00  ? Pack years: 60.00  ? Types: Cigarettes  ?Smokeless Tobacco Never  ?Tobacco Comments  ? quit in 1991-1992  ? ? ?Labs: ?Review Flowsheet   ? ?  ?  Latest Ref Rng & Units 11/23/2018 05/23/2019 04/16/2020 11/03/2020  ?Labs for [facility] Cardiac and Pulmonary Rehab  ?Cholestrol 0 - 200 mg/dL 181   135   171   163    ?LDL (calc) 0 - 99 mg/dL 103   71   104   99    ?HDL-C >40 mg/dL 41   40   39   35    ?Trlycerides <150 mg/dL 185   138   159   167    ?Hemoglobin A1c 4.8 - 5.6 % 5.1       ? ?  02/27/2021  ?Labs for [facility] Cardiac and Pulmonary Rehab  ?Cholestrol 155    ?LDL (calc) 83    ?HDL-C 45    ?Trlycerides 135    ?Hemoglobin A1c   ?  ? ? Multiple values from one day are sorted in  reverse-chronological order  ?  ?  ? ? ? ?Exercise Target Goals: ?Exercise Program Goal: ?Individual exercise prescription set using results from initial 6 min walk test and THRR while considering  patient?s activity barriers and safety.  ? ?Exercise Prescription Goal: ?Initial exercise prescription builds to 30-45 minutes a day of aerobic activity, 2-3 days per week.  Home exercise guidelines will be given to patient during program as part of exercise prescription that the participant will acknowledge. ? ? ?Education: Aerobic Exercise: ?- Group verbal and visual presentation on the components of exercise prescription. Introduces F.I.T.T principle from ACSM for exercise prescriptions.  Reviews F.I.T.T. principles of aerobic exercise including progression. Written material given at graduation. ?Flowsheet Row Cardiac Rehab from 06/11/2021 in [facility] Cardiac and Pulmonary Rehab  ?Education need identified 04/30/21  ? ?  ? ? ?Education: Resistance Exercise: ?- Group verbal and visual presentation on the components of  exercise prescription. Introduces F.I.T.T principle from ACSM for exercise prescriptions  Reviews F.I.T.T. principles of resistance exercise including progression. Written material given at graduation. ? ?  ?Education: Exercise & Equipment Safety: ?- Individual verbal instruction and demonstration of equipment use and safety with use of the equipment. ?Flowsheet Row Cardiac Rehab from 06/11/2021 in [facility] Cardiac and Pulmonary Rehab  ?Date 04/30/21  ?Educator [facility]  ?Instruction Review Code 1- Verbalizes Understanding  ? ?  ? ? ?Education: Exercise Physiology & General Exercise Guidelines: ?- Group verbal and written instruction with models to review the exercise physiology of the cardiovascular system and associated critical values. Provides general exercise guidelines with specific guidelines to those with heart or lung disease.  ?Flowsheet Row Cardiac Rehab from 06/11/2021 in [facility] Cardiac and Pulmonary Rehab  ?Date 05/21/21  ?Educator AS  ?Instruction Review Code 1- Verbalizes Understanding  ? ?  ? ? ?Education: Flexibility, Balance, Mind/Body Relaxation: ?- Group verbal and visual presentation with interactive activity on the components of exercise prescription. Introduces F.I.T.T principle from ACSM for exercise prescriptions. Reviews F.I.T.T. principles of flexibility and balance exercise training including progression. Also discusses the mind body connection.  Reviews various relaxation techniques to help reduce and manage stress (i.e. Deep breathing, progressive muscle relaxation, and visualization). Balance handout provided to take home. Written material given at graduation. ? ? ?Activity Barriers & Risk Stratification: ? Activity Barriers & Cardiac Risk Stratification - 04/30/21 1137   ? ?  ? Activity Barriers & Cardiac Risk Stratification  ? Activity Barriers Other (comment);Back Problems;Right Knee Replacement;Deconditioning;Muscular Weakness;Shortness of Breath;Balance Concerns;History of Falls;Assistive  Device   ? Comments bilateral foot drop- wears braces, significant fatigue since MI   ? Cardiac Risk Stratification High   ? ?  ?  ? ?  ? ? ?6 Minute Walk: ? 6 Minute Walk   ? ? [place] Name 04/30/21 1137  ?  ?  ?  ? 6 Minute Walk  ? Phase Initial    ? Distance 594 feet    ? Walk Time 6 minutes    ? # of Rest Breaks 0    ? MPH 1.13    ? METS 0.98    ? RPE 13    ? Perceived Dyspnea  1    ? VO2 Peak 3.42    ? Symptoms Yes (comment)    ? Comments low back pain 2/10, SOB    ? Resting HR 58 bpm    ? Resting BP 126/64    ? Resting Oxygen Saturation  99 %    ? Exercise Oxygen Saturation  during  6 min walk 95 %    ? Max Ex. HR 84 bpm    ? Max Ex. BP 132/62    ? 2 Minute Post BP 124/62    ? ?  ?  ? ?  ? ? ?Oxygen Initial Assessment: ? ? ?Oxygen Re-Evaluation: ? ? ?Oxygen Discharge (Final Oxygen Re-Evaluation): ? ? ?Initial Exercise Prescription: ? Initial Exercise Prescription - 04/30/21 1100   ? ?  ? Date of Initial Exercise RX and Referring Provider  ? Date 04/30/21   ? Referring Provider Donnelly Angelica MD   ?  ? Oxygen  ? Maintain Oxygen Saturation 88% or higher   ?  ? Treadmill  ? MPH 1   ? Grade 0.5   ? Minutes 15   ? METs 1.8   ?  ? NuStep  ? Level 1   ? SPM 80   ? Minutes 15   ? METs 1.5   ?  ? T5 Nustep  ? Level 1   ? SPM 80   ? Minutes 15   ? METs 1.5   ?  ? Track  ? Laps 15   ? Minutes 15   ? METs 1.82   ?  ? Prescription Details  ? Frequency (times per week) 2   ? Duration Progress to 30 minutes of continuous aerobic without signs/symptoms of physical distress   ?  ? Intensity  ? THRR 40-80% of Max Heartrate 92-125   ? Ratings of Perceived Exertion 11-13   ? Perceived Dyspnea 0-4   ?  ? Progression  ? Progression Continue to progress workloads to maintain intensity without signs/symptoms of physical distress.   ?  ? Resistance Training  ? Training Prescription Yes   ? Weight 3 lb   ? Reps 10-15   ? ?  ?  ? ?  ? ? ?Perform Capillary Blood Glucose checks as needed. ? ?Exercise Prescription Changes: ? ? Exercise  Prescription Changes   ? ? [place] Name 04/30/21 1100 05/11/21 1600 05/26/21 0900 05/26/21 1200  ?  ?  ? Response to Exercise  ? Blood Pressure (Admit) 126/64 118/66 -- 120/82   ? Blood Pressure (Exercise) 132/62 118/68

## 2021-06-18 ENCOUNTER — Other Ambulatory Visit: Payer: Self-pay

## 2021-06-18 DIAGNOSIS — Z955 Presence of coronary angioplasty implant and graft: Secondary | ICD-10-CM

## 2021-06-18 DIAGNOSIS — I214 Non-ST elevation (NSTEMI) myocardial infarction: Secondary | ICD-10-CM

## 2021-06-18 NOTE — Progress Notes (Signed)
Daily Session Note ? ?Patient Details  ?Name: Justin Warren ?MRN: [id number] ?Date of Birth: [birth year] ?Referring Provider:   ?Flowsheet Row Cardiac Rehab from 04/30/2021 in [facility] Cardiac and Pulmonary Rehab  ?Referring Provider Donnelly Angelica MD  ? ?  ? ? ?Encounter Date: 06/18/2021 ? ?Check In: ? Session Check In - 06/18/21 0920   ? ?  ? Check-In  ? Supervising physician immediately available to respond to emergencies See telemetry face sheet for immediately available ER MD   ? Location [facility]Cardiac & Pulmonary Rehab   ? Staff Present Birdie Sons, MPA, Nino Glow, MS, ASCM CEP, Exercise Physiologist;Lakeeta Dobosz Amedeo Plenty, BS, ACSM CEP, Exercise Physiologist   ? Virtual Visit No   ? Medication changes reported     No   ? Fall or balance concerns reported    No   ? Warm-up and Cool-down Performed on first and last piece of equipment   ? Resistance Training Performed Yes   ? VAD Patient? No   ? PAD/SET Patient? No   ?  ? Pain Assessment  ? Currently in Pain? No/denies   ? ?  ?  ? ?  ? ? ? ? ? ?Social History  ? ?Tobacco Use  ?Smoking Status Former  ? Packs/day: 2.00  ? Years: 30.00  ? Pack years: 60.00  ? Types: Cigarettes  ?Smokeless Tobacco Never  ?Tobacco Comments  ? quit in 1991-1992  ? ? ?Goals Met:  ?Independence with exercise equipment ?Exercise tolerated well ?No report of concerns or symptoms today ?Strength training completed today ? ?Goals Unmet:  ?Not Applicable ? ?Comments: Pt able to follow exercise prescription today without complaint.  Will continue to monitor for progression. ? ? ? ?Dr. Emily Filbert is Medical Director for [place].  ?Dr. Ottie Glazier is Medical Director for [facility] Pulmonary Rehabilitation. ?

## 2021-06-23 ENCOUNTER — Other Ambulatory Visit: Payer: Self-pay

## 2021-06-23 DIAGNOSIS — Z955 Presence of coronary angioplasty implant and graft: Secondary | ICD-10-CM | POA: Diagnosis not present

## 2021-06-23 DIAGNOSIS — I214 Non-ST elevation (NSTEMI) myocardial infarction: Secondary | ICD-10-CM

## 2021-06-23 DIAGNOSIS — D044 Carcinoma in situ of skin of scalp and neck: Secondary | ICD-10-CM | POA: Diagnosis not present

## 2021-06-23 NOTE — Progress Notes (Signed)
Daily Session Note ? ?Patient Details  ?Name: Justin Warren ?MRN: [id number] ?Date of Birth: 11-19-1945 ?Referring Provider:   ?Flowsheet Row Cardiac Rehab from 04/30/2021 in [facility] Cardiac and Pulmonary Rehab  ?Referring Provider Donnelly Angelica MD  ? ?  ? ? ?Encounter Date: 06/23/2021 ? ?Check In: ? Session Check In - 06/23/21 0737   ? ?  ? Check-In  ? Supervising physician immediately available to respond to emergencies See telemetry face sheet for immediately available ER MD   ? Location [facility]Card[facility] Pul[facility] Rehab   ? Staff Present Birdie Sons, MPA, RN;Jessica [place], MA, RCEP, CCRP, CCET;Amanda Sommer, BA, ACSM CEP, Exercise Physiologist   ? Virtual Visit No   ? Medication changes reported     No   ? Fall or balance concerns reported    No   ? Warm-up and Cool-down Performed on first and last piece of equipment   ? Resistance Training Performed Yes   ? VAD Patient? No   ? PAD/SET Patient? No   ?  ? Pain Assessment  ? Currently in Pain? No/denies   ? ?  ?  ? ?  ? ? ? ? ? ?Social History  ? ?Tobacco Use  ?Smoking Status Former  ? Packs/day: 2.00  ? Years: 30.00  ? Pack years: 60.00  ? Types: Cigarettes  ?Smokeless Tobacco Never  ?Tobacco Comments  ? quit in 1991-1992  ? ? ?Goals Met:  ?Independence with exercise equipment ?Exercise tolerated well ?No report of concerns or symptoms today ?Strength training completed today ? ?Goals Unmet:  ?Not Applicable ? ?Comments: Pt able to follow exercise prescription today without complaint.  Will continue to monitor for progression. ? ? ? ?Dr. Emily Filbert is Medical Director for [place].  ?Dr. Ottie Glazier is Medical Director for [facility] Pulmonary Rehabilitation. ?

## 2021-06-25 DIAGNOSIS — I214 Non-ST elevation (NSTEMI) myocardial infarction: Secondary | ICD-10-CM | POA: Diagnosis not present

## 2021-06-25 DIAGNOSIS — Z955 Presence of coronary angioplasty implant and graft: Secondary | ICD-10-CM

## 2021-06-25 NOTE — Progress Notes (Signed)
Daily Session Note ? ?Patient Details  ?Name: Justin Warren ?MRN: [id number] ?Date of Birth: 02-07-1946 ?Referring Provider:   ?Flowsheet Row Cardiac Rehab from 04/30/2021 in [facility] Cardiac and Pulmonary Rehab  ?Referring Provider Donnelly Angelica MD  ? ?  ? ? ?Encounter Date: 06/25/2021 ? ?Check In: ? Session Check In - 06/25/21 0937   ? ?  ? Check-In  ? Supervising physician immediately available to respond to emergencies See telemetry face sheet for immediately available ER MD   ? Location [facility]   ? Staff Present Birdie Sons, MPA, Nino Glow, MS, ASCM CEP, Exercise Physiologist;Melissa Roscoe, RDN, Rowe Pavy, BA, ACSM CEP, Exercise Physiologist   ? Virtual Visit No   ? Medication changes reported     No   ? Fall or balance concerns reported    No   ? Warm-up and Cool-down Performed on first and last piece of equipment   ? Resistance Training Performed Yes   ? VAD Patient? No   ? PAD/SET Patient? No   ?  ? Pain Assessment  ? Currently in Pain? No/denies   ? ?  ?  ? ?  ? ? ? ? ? ?Social History  ? ?Tobacco Use  ?Smoking Status Former  ? Packs/day: 2.00  ? Years: 30.00  ? Pack years: 60.00  ? Types: Cigarettes  ?Smokeless Tobacco Never  ?Tobacco Comments  ? quit in 1991-1992  ? ? ?Goals Met:  ?Independence with exercise equipment ?Exercise tolerated well ?No report of concerns or symptoms today ?Strength training completed today ? ?Goals Unmet:  ?Not Applicable ? ?Comments: Pt able to follow exercise prescription today without complaint.  Will continue to monitor for progression. ? ? ? ?Dr. Emily Filbert is Medical Director for [place].  ?Dr. Ottie Glazier is Medical Director for [facility] Pulmonary Rehabilitation. ?

## 2021-06-29 ENCOUNTER — Ambulatory Visit (INDEPENDENT_AMBULATORY_CARE_PROVIDER_SITE_OTHER): Payer: Medicare HMO

## 2021-06-29 VITALS — Wt 193.0 lb

## 2021-06-29 DIAGNOSIS — Z0000 Encounter for general adult medical examination without abnormal findings: Secondary | ICD-10-CM

## 2021-06-29 NOTE — Progress Notes (Signed)
?Virtual Visit via Telephone Note ? ?I connected with  Justin Warren on 06/29/21 at  8:15 AM EDT by telephone and verified that I am speaking with the correct person using two identifiers. ? ?Location: ?Patient: home ?Provider: [facility] ?Persons participating in the virtual visit: patient/Nurse Health Advisor ?  ?I discussed the limitations, risks, security and privacy concerns of performing an evaluation and management service by telephone and the availability of in person appointments. The patient expressed understanding and agreed to proceed. ? ?Interactive audio and video telecommunications were attempted between this nurse and patient, however failed, due to patient having technical difficulties OR patient did not have access to video capability.  We continued and completed visit with audio only. ? ?Some vital signs may be absent or patient reported.  ? ?Justin David, LPN ? ?Subjective:  ? Justin Warren is a 76 y.o. male who presents for Medicare Annual/Subsequent preventive examination. ? ?Review of Systems    ? ?  ? ?   ?Objective:  ?  ?There were no vitals filed for this visit. ?There is no height or weight on file to calculate BMI. ? ? ?  02/26/2021  ?  2:47 PM 07/09/2020  ?  8:40 AM 06/23/2020  ?  8:31 AM 02/06/2020  ?  6:08 AM 02/01/2020  ?  1:27 PM 11/28/2019  ?  1:58 PM 11/24/2019  ?  4:20 PM  ?Advanced Directives  ?Does Patient Have a Medical Advance Directive? No No Yes Yes Yes Yes No;Yes  ?Type of Scientist, physiological of [place];Living will [place];Living will [place];Living will Living will Living will  ?Does patient want to make changes to medical advance directive?    No - Patient declined No - Patient declined    ?Copy of [place] in Chart?  No - copy requested No - copy requested No - copy requested No - copy requested    ?Would patient like information on creating a medical advance directive? No - Patient declined No -  Patient declined       ? ? ?Current Medications (verified) ?Outpatient Encounter Medications as of 06/29/2021  ?Medication Sig  ? acetaminophen (TYLENOL) 650 MG CR tablet Take 650 mg by mouth every 8 (eight) hours as needed for pain.  ? atorvastatin (LIPITOR) 80 MG tablet Take 1 tablet (80 mg total) by mouth daily.  ? busPIRone (BUSPAR) 15 MG tablet Take 1 tablet (15 mg total) by mouth 2 (two) times daily.  ? clopidogrel (PLAVIX) 75 MG tablet Take 1 tablet (75 mg total) by mouth daily with breakfast.  ? dicyclomine (BENTYL) 20 MG tablet Take 1 tablet (20 mg total) by mouth 3 (three) times daily before meals.  ? donepezil (ARICEPT) 5 MG tablet Take 5 mg by mouth daily.  ? ELIQUIS 5 MG TABS tablet SMARTSIG:1 Tablet(s) By Mouth Every 12 Hours  ? FLUoxetine (PROZAC) 20 MG capsule Take 1 capsule (20 mg total) by mouth daily.  ? losartan (COZAAR) 50 MG tablet Take 1 tablet (50 mg total) by mouth daily.  ? metoprolol succinate (TOPROL-XL) 25 MG 24 hr tablet Take 25 mg by mouth daily.  ? omeprazole (PRILOSEC) 40 MG capsule Take 1 capsule (40 mg total) by mouth daily.  ? tadalafil (CIALIS) 5 MG tablet Take 1 tablet (5 mg total) by mouth daily as needed for erectile dysfunction.  ? ?No facility-administered encounter medications on file as of 06/29/2021.  ? ? ?Allergies (  verified) ?Amoxicillin-pot clavulanate  ? ?History: ?Past Medical History:  ?Diagnosis Date  ? Anxiety   ? Bradycardia   ? GERD (gastroesophageal reflux disease)   ? History of kidney stones   ? Hyperlipidemia   ? Hypertension   ? Kidney stone   ? Neuropathy   ? ?Past Surgical History:  ?Procedure Laterality Date  ? APPENDECTOMY  1994  ? BACK SURGERY    ? CARPAL TUNNEL RELEASE Right 2012  ? CARPAL TUNNEL RELEASE Left 02/06/2020  ? Procedure: CARPAL TUNNEL RELEASE;  Surgeon: Earnestine Leys, MD;  Location: [facility] ORS;  Service: Orthopedics;  Laterality: Left;  BLOCK  ? CATARACT EXTRACTION, BILATERAL  2019  ? COLONOSCOPY WITH PROPOFOL N/A 07/09/2020  ? Procedure:  COLONOSCOPY WITH PROPOFOL;  Surgeon: Lin Landsman, MD;  Location: [facility] ENDOSCOPY;  Service: Gastroenterology;  Laterality: N/A;  ? CORONARY STENT INTERVENTION N/A 02/27/2021  ? Procedure: CORONARY STENT INTERVENTION;  Surgeon: Yolonda Kida, MD;  Location: [place] CV LAB;  Service: Cardiovascular;  Laterality: N/A;  ? CYSTOSCOPY W/ RETROGRADES Right 11/30/2019  ? Procedure: CYSTOSCOPY WITH RETROGRADE PYELOGRAM;  Surgeon: [name], MD;  Location: [facility] ORS;  Service: Urology;  Laterality: Right;  ? CYSTOSCOPY/URETEROSCOPY/HOLMIUM LASER/STENT PLACEMENT Right 11/30/2019  ? Procedure: CYSTOSCOPY/URETEROSCOPY/HOLMIUM LASER/STENT PLACEMENT;  Surgeon: [name], MD;  Location: [facility] ORS;  Service: Urology;  Laterality: Right;  ? ESOPHAGOGASTRODUODENOSCOPY (EGD) WITH PROPOFOL N/A 07/09/2020  ? Procedure: ESOPHAGOGASTRODUODENOSCOPY (EGD) WITH PROPOFOL;  Surgeon: Lin Landsman, MD;  Location: [facility] ENDOSCOPY;  Service: Gastroenterology;  Laterality: N/A;  ? EYE SURGERY    ? INSERT / REPLACE / REMOVE PACEMAKER    ? JOINT REPLACEMENT    ? LEFT HEART CATH AND CORONARY ANGIOGRAPHY N/A 02/27/2021  ? Procedure: LEFT HEART CATH AND CORONARY ANGIOGRAPHY possible PCI and stent;  Surgeon: Yolonda Kida, MD;  Location: [place] CV LAB;  Service: Cardiovascular;  Laterality: N/A;  ? [place]  ? Dr. [name]  ? PACEMAKER INSERTION Left 04/20/2018  ? Procedure: INSERTION PACEMAKER-DUAL CHAMBER;  Surgeon: [name], MD;  Location: [facility] ORS;  Service: Cardiovascular;  Laterality: Left;  ? 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 SIGMOIDOSCOPY  2010  ? TRIGGER FINGER RELEASE Left 02/06/2020  ? Procedure: RELEASE TRIGGER FINGER/A-1 PULLEY;  Surgeon: Earnestine Leys, MD;  Location: [facility] ORS;  Service: Orthopedics;  Laterality: Left;  ? ?Family History  ?Problem Relation Age of Onset  ? Dementia Mother   ? GER disease Mother   ? Hypertension Father   ? Arrhythmia Sister   ?  Heart murmur Sister   ? ?Social History  ? ?Socioeconomic History  ? Marital status: Married  ?  Spouse name: Not on file  ? Number of children: 3  ? Years of education: Not on file  ? Highest education level: Bachelor's degree (e.g., BA, AB, BS)  ?Occupational History  ? Occupation: retired  ?Tobacco Use  ? Smoking status: Former  ?  Packs/day: 2.00  ?  Years: 30.00  ?  Pack years: 60.00  ?  Types: Cigarettes  ? Smokeless tobacco: Never  ? Tobacco comments:  ?  quit in 1991-1992  ?Vaping Use  ? Vaping Use: Never used  ?Substance and Sexual Activity  ? Alcohol use: Not Currently  ?  Alcohol/week: 0.0 standard drinks  ?  Comment: quit over 30 years ago  ? Drug use: No  ? Sexual activity: Yes  ?  Birth control/protection: None  ?Other Topics Concern  ?  Not on file  ?Social History Narrative  ? Not on file  ? ?Social Determinants of Health  ? ?Financial Resource Strain: Not on file  ?Food Insecurity: Not on file  ?Transportation Needs: Not on file  ?Physical Activity: Not on file  ?Stress: Not on file  ?Social Connections: Not on file  ? ? ?Tobacco Counseling ?Counseling given: Not Answered ?Tobacco comments: quit in 1991-1992 ? ? ?Clinical Intake: ? ?Pre-visit preparation completed: Yes ? ?  ? ?  ? ?Diabetes: No ? ?How often do you need to have someone help you when you read instructions, pamphlets, or other written materials from your doctor or pharmacy?: 1 - Never ? ?Diabetic?no ? ?Interpreter Needed?: No ? ?Information entered by :: Kirke Shaggy, LPN ? ? ?Activities of Daily Living ? ?  02/27/2021  ?  3:56 PM 02/27/2021  ?  3:54 PM  ?In your present state of health, do you have any difficulty performing the following activities:  ?Hearing?  1  ?Vision?  0  ?Difficulty concentrating or making decisions?  0  ?Walking or climbing stairs?  0  ?Dressing or bathing?  0  ?Doing errands, shopping? 0   ? ? ?Patient Care Team: ?Gwyneth Sprout, FNP as PCP - General (Family Medicine) ?Patty, A. Joneen Caraway, MD as Consulting Physician  (Ophthalmology) ?Teodoro Spray, MD as Consulting Physician (Cardiology) ?Lovell Sheehan, MD as Consulting Physician (Orthopedic Surgery) ?Anabel Bene, MD as Referring Physician (Neurology) ? ?Indicat

## 2021-06-29 NOTE — Patient Instructions (Signed)
Justin Warren , ?Thank you for taking time to come for your Medicare Wellness Visit. I appreciate your ongoing commitment to your health goals. Please review the following plan we discussed and let me know if I can assist you in the future.  ? ?Screening recommendations/referrals: ?Colonoscopy: 07/09/20 ?Recommended yearly ophthalmology/optometry visit for glaucoma screening and checkup ?Recommended yearly dental visit for hygiene and checkup ? ?Vaccinations: ?Influenza vaccine: 02/08/21 ?Pneumococcal vaccine: 04/03/15 ?Tdap vaccine: 03/29/10, due ?Shingles vaccine: Zostavax 03/01/11   ?Covid-19: 05/08/19, 05/29/19, 01/09/20 ? ?Advanced directives: no ? ?Conditions/risks identified: none ? ?Next appointment: Follow up in one year for your annual wellness visit. - 07/01/22 @ 8:15 by phone ? ?Preventive Care 49 Years and Older, Male ?Preventive care refers to lifestyle choices and visits with your health care provider that can promote health and wellness. ?What does preventive care include? ?A yearly physical exam. This is also called an annual well check. ?Dental exams once or twice a year. ?Routine eye exams. Ask your health care provider how often you should have your eyes checked. ?Personal lifestyle choices, including: ?Daily care of your teeth and gums. ?Regular physical activity. ?Eating a healthy diet. ?Avoiding tobacco and drug use. ?Limiting alcohol use. ?Practicing safe sex. ?Taking low doses of aspirin every day. ?Taking vitamin and mineral supplements as recommended by your health care provider. ?What happens during an annual well check? ?The services and screenings done by your health care provider during your annual well check will depend on your age, overall health, lifestyle risk factors, and family history of disease. ?Counseling  ?Your health care provider may ask you questions about your: ?Alcohol use. ?Tobacco use. ?Drug use. ?Emotional well-being. ?Home and relationship well-being. ?Sexual activity. ?Eating  habits. ?History of falls. ?Memory and ability to understand (cognition). ?Work and work Statistician. ?Screening  ?You may have the following tests or measurements: ?Height, weight, and BMI. ?Blood pressure. ?Lipid and cholesterol levels. These may be checked every 5 years, or more frequently if you are over 50 years old. ?Skin check. ?Lung cancer screening. You may have this screening every year starting at age 6 if you have a 30-pack-year history of smoking and currently smoke or have quit within the past 15 years. ?Fecal occult blood test (FOBT) of the stool. You may have this test every year starting at age 85. ?Flexible sigmoidoscopy or colonoscopy. You may have a sigmoidoscopy every 5 years or a colonoscopy every 10 years starting at age 49. ?Prostate cancer screening. Recommendations will vary depending on your family history and other risks. ?Hepatitis C blood test. ?Hepatitis B blood test. ?Sexually transmitted disease (STD) testing. ?Diabetes screening. This is done by checking your blood sugar (glucose) after you have not eaten for a while (fasting). You may have this done every 1-3 years. ?Abdominal aortic aneurysm (AAA) screening. You may need this if you are a current or former smoker. ?Osteoporosis. You may be screened starting at age 59 if you are at high risk. ?Talk with your health care provider about your test results, treatment options, and if necessary, the need for more tests. ?Vaccines  ?Your health care provider may recommend certain vaccines, such as: ?Influenza vaccine. This is recommended every year. ?Tetanus, diphtheria, and acellular pertussis (Tdap, Td) vaccine. You may need a Td booster every 10 years. ?Zoster vaccine. You may need this after age 51. ?Pneumococcal 13-valent conjugate (PCV13) vaccine. One dose is recommended after age 55. ?Pneumococcal polysaccharide (PPSV23) vaccine. One dose is recommended after age 62. ?Talk to your  health care provider about which screenings and  vaccines you need and how often you need them. ?This information is not intended to replace advice given to you by your health care provider. Make sure you discuss any questions you have with your health care provider. ?Document Released: 04/11/2015 Document Revised: 12/03/2015 Document Reviewed: 01/14/2015 ?Elsevier Interactive Patient Education ? 2017 [place]. ? ?Fall Prevention in the Home ?Falls can cause injuries. They can happen to people of all ages. There are many things you can do to make your home safe and to help prevent falls. ?What can I do on the outside of my home? ?Regularly fix the edges of walkways and driveways and fix any cracks. ?Remove anything that might make you trip as you walk through a door, such as a raised step or threshold. ?Trim any bushes or trees on the path to your home. ?Use bright outdoor lighting. ?Clear any walking paths of anything that might make someone trip, such as rocks or tools. ?Regularly check to see if handrails are loose or broken. Make sure that both sides of any steps have handrails. ?Any raised decks and porches should have guardrails on the edges. ?Have any leaves, snow, or ice cleared regularly. ?Use sand or salt on walking paths during winter. ?Clean up any spills in your garage right away. This includes oil or grease spills. ?What can I do in the bathroom? ?Use night lights. ?Install grab bars by the toilet and in the tub and shower. Do not use towel bars as grab bars. ?Use non-skid mats or decals in the tub or shower. ?If you need to sit down in the shower, use a plastic, non-slip stool. ?Keep the floor dry. Clean up any water that spills on the floor as soon as it happens. ?Remove soap buildup in the tub or shower regularly. ?Attach bath mats securely with double-sided non-slip rug tape. ?Do not have throw rugs and other things on the floor that can make you trip. ?What can I do in the bedroom? ?Use night lights. ?Make sure that you have a light by your  bed that is easy to reach. ?Do not use any sheets or blankets that are too big for your bed. They should not hang down onto the floor. ?Have a firm chair that has side arms. You can use this for support while you get dressed. ?Do not have throw rugs and other things on the floor that can make you trip. ?What can I do in the kitchen? ?Clean up any spills right away. ?Avoid walking on wet floors. ?Keep items that you use a lot in easy-to-reach places. ?If you need to reach something above you, use a strong step stool that has a grab bar. ?Keep electrical cords out of the way. ?Do not use floor polish or wax that makes floors slippery. If you must use wax, use non-skid floor wax. ?Do not have throw rugs and other things on the floor that can make you trip. ?What can I do with my stairs? ?Do not leave any items on the stairs. ?Make sure that there are handrails on both sides of the stairs and use them. Fix handrails that are broken or loose. Make sure that handrails are as long as the stairways. ?Check any carpeting to make sure that it is firmly attached to the stairs. Fix any carpet that is loose or worn. ?Avoid having throw rugs at the top or bottom of the stairs. If you do have throw rugs, attach them  to the floor with carpet tape. ?Make sure that you have a light switch at the top of the stairs and the bottom of the stairs. If you do not have them, ask someone to add them for you. ?What else can I do to help prevent falls? ?Wear shoes that: ?Do not have high heels. ?Have rubber bottoms. ?Are comfortable and fit you well. ?Are closed at the toe. Do not wear sandals. ?If you use a stepladder: ?Make sure that it is fully opened. Do not climb a closed stepladder. ?Make sure that both sides of the stepladder are locked into place. ?Ask someone to hold it for you, if possible. ?Clearly mark and make sure that you can see: ?Any grab bars or handrails. ?First and last steps. ?Where the edge of each step is. ?Use tools that  help you move around (mobility aids) if they are needed. These include: ?Canes. ?Walkers. ?Scooters. ?Crutches. ?Turn on the lights when you go into a dark area. Replace any light bulbs as soon as they b

## 2021-06-30 ENCOUNTER — Encounter: Payer: Self-pay | Admitting: *Deleted

## 2021-06-30 DIAGNOSIS — I214 Non-ST elevation (NSTEMI) myocardial infarction: Secondary | ICD-10-CM

## 2021-06-30 DIAGNOSIS — Z955 Presence of coronary angioplasty implant and graft: Secondary | ICD-10-CM

## 2021-06-30 NOTE — Progress Notes (Signed)
Discharge Progress Report ? ?Patient Details  ?Name: Justin Warren ?MRN: [id number] ?Date of Birth: 22-May-1945 ?Referring Provider:   ?Flowsheet Row Cardiac Rehab from 04/30/2021 in [facility] Cardiac and Pulmonary Rehab  ?Referring Provider Donnelly Angelica MD  ? ?  ? ? ? ?Number of Visits: 15 ? ?Reason for Discharge:  ?Early Exit:  Insurance and Personal ? ?Smoking History:  ?Social History  ? ?Tobacco Use  ?Smoking Status Former  ? Packs/day: 2.00  ? Years: 30.00  ? Pack years: 60.00  ? Types: Cigarettes  ?Smokeless Tobacco Never  ?Tobacco Comments  ? quit in 1991-1992  ? ? ?Diagnosis:  ?NSTEMI (non-ST elevated myocardial infarction) ([place]) ? ?Status post coronary artery stent placement ? ?ADL [facility]: ? ? ?Initial Exercise Prescription: ? Initial Exercise Prescription - 04/30/21 1100   ? ?  ? Date of Initial Exercise RX and Referring Provider  ? Date 04/30/21   ? Referring Provider Donnelly Angelica MD   ?  ? Oxygen  ? Maintain Oxygen Saturation 88% or higher   ?  ? Treadmill  ? MPH 1   ? Grade 0.5   ? Minutes 15   ? METs 1.8   ?  ? NuStep  ? Level 1   ? SPM 80   ? Minutes 15   ? METs 1.5   ?  ? T5 Nustep  ? Level 1   ? SPM 80   ? Minutes 15   ? METs 1.5   ?  ? Track  ? Laps 15   ? Minutes 15   ? METs 1.82   ?  ? Prescription Details  ? Frequency (times per week) 2   ? Duration Progress to 30 minutes of continuous aerobic without signs/symptoms of physical distress   ?  ? Intensity  ? THRR 40-80% of Max Heartrate 92-125   ? Ratings of Perceived Exertion 11-13   ? Perceived Dyspnea 0-4   ?  ? Progression  ? Progression Continue to progress workloads to maintain intensity without signs/symptoms of physical distress.   ?  ? Resistance Training  ? Training Prescription Yes   ? Weight 3 lb   ? Reps 10-15   ? ?  ?  ? ?  ? ? ?Discharge Exercise Prescription (Final Exercise Prescription Changes): ? Exercise Prescription Changes - 05/26/21 1200   ? ?  ? Response to Exercise  ? Blood Pressure (Admit) 120/82   ? Blood Pressure (Exercise) 130/66    ? Blood Pressure (Exit) 124/70   ? Heart Rate (Admit) 54 bpm   ? Heart Rate (Exercise) 72 bpm   ? Heart Rate (Exit) 70 bpm   ? Rating of Perceived Exertion (Exercise) 13   ? Duration Progress to 30 minutes of  aerobic without signs/symptoms of physical distress   ? Intensity THRR unchanged   ?  ? Progression  ? Progression Continue to progress workloads to maintain intensity without signs/symptoms of physical distress.   ? Average METs 1.9   ?  ? Resistance Training  ? Training Prescription Yes   ? Weight 3 lb   ? Reps 10-15   ?  ? Interval Training  ? Interval Training No   ?  ? NuStep  ? Level 3   ? Minutes 15   ? METs 1.9   ?  ? Home Exercise Plan  ? Plans to continue exercise at [employer] (comment)   [employer] and walking  ? Frequency Add 3 additional days to  program exercise sessions.   ? Initial Home Exercises Provided 05/26/21   ? ?  ?  ? ?  ? ? ?Functional Capacity: ? 6 Minute Walk   ? ? [place] Name 04/30/21 1137  ?  ?  ?  ? 6 Minute Walk  ? Phase Initial    ? Distance 594 feet    ? Walk Time 6 minutes    ? # of Rest Breaks 0    ? MPH 1.13    ? METS 0.98    ? RPE 13    ? Perceived Dyspnea  1    ? VO2 Peak 3.42    ? Symptoms Yes (comment)    ? Comments low back pain 2/10, SOB    ? Resting HR 58 bpm    ? Resting BP 126/64    ? Resting Oxygen Saturation  99 %    ? Exercise Oxygen Saturation  during 6 min walk 95 %    ? Max Ex. HR 84 bpm    ? Max Ex. BP 132/62    ? 2 Minute Post BP 124/62    ? ?  ?  ? ?  ? ? ?Psychological, QOL, Others - Outcomes: ?PHQ 2/9: ? ?  06/29/2021  ?  8:33 AM 06/11/2021  ?  9:17 AM 04/30/2021  ? 11:43 AM 06/23/2020  ?  8:27 AM 05/08/2020  ?  9:53 AM  ?Depression screen PHQ 2/9  ?Decreased Interest 1 2 3  0 0  ?Down, Depressed, Hopeless 0 2 1 0 0  ?PHQ - 2 Score 1 4 4  0 0  ?Altered sleeping 1 2 1   0  ?Tired, decreased energy 1 2 1   0  ?Change in appetite 0 3 2  0  ?Feeling bad or failure about yourself  0 2 1  0  ?Trouble concentrating 0 0 1  0  ?Moving slowly or fidgety/restless 0 0  0  0  ?Suicidal thoughts 0 0 0  0  ?PHQ-9 Score 3 13 10   0  ?Difficult doing work/chores  Somewhat difficult Somewhat difficult  Not difficult at all  ? ? ?Quality of Life: ? Quality of Life - 04/30/21 1141   ? ?  ? Quality of Life  ? [place] Quality of Life   ?  ? Quality of Life Scores  ? Health/Function Pre 14.13 %   ? Socioeconomic Pre 16 %   ? Psych/Spiritual Pre 16.29 %   ? Family Pre 16.4 %   ? GLOBAL Pre 15.31 %   ? ?  ?  ? ?  ? ? ?  ? ? ? ? ?Nutrition & Weight - Outcomes: ? Pre Biometrics - 04/30/21 1141   ? ?  ? Pre Biometrics  ? Height 5' 6.7" (1.694 m)   ? Weight 198 lb 12.8 oz (90.2 kg)   ? BMI (Calculated) 31.42   ? Single Leg Stand 0 seconds   ? ?  ?  ? ?  ? ? ? ?Nutrition: ? Nutrition Therapy & Goals - 05/13/21 1531   ? ?  ? Nutrition Therapy  ? Diet Heart healthy, low Na   ? Drug/Food Interactions Statins/Certain Fruits   ? Protein (specify units) 70g   ? Fiber 30 grams   while symptomatic with loose bowels, limit fiber  ? Whole Grain Foods 3 servings   while symptomatic with loose bowels, limit fiber  ? Saturated Fats 16 max. grams   ? Fruits and Vegetables 8 servings/day   ?  Sodium 2 grams   ?  ? Personal Nutrition Goals  ? Nutrition Goal ST: read nutrition labels for key nutrients like salt, saturated fat, trans fat. talk to MD regarding loose bowels. LT: limit saturated fat <16g/day, limit Na <2g/day, eat at least 5 servings of fruit/vegetables per day, include some walnuts or ground flaxseed to help include omega-3 (he does not like fish)   ? Comments 76 y.o. M admitted to rehab for NSTEMI also presenting with PAD, HTN. PMHx GERD. Relevant medications include lipitor, buspar, protonix. PYP Score: 55. Vegetables & Fruits 4/12. Breads, Grains & Cereals 6/12. Red & Processed Meat 8/12. Poultry 2/2. Fish & Shellfish 0/4. Beans, Nuts & Seeds 1/4. Milk & Dairy Foods 5/6. Toppings, Oils, Seasonings & Salt 14/20. Sweets, Snacks & Restaurant Food 8/14. Beverages 7/10. Stopped adding sugar to coffee and  tea. B: nothing L: chicken salad and crackers D: eat out and get vegetables or pizza. 3-4x go out to eat. Pork chops, chicken - chicken and dumplings, chicken salad. He reports using olive garden salad dressing. He reports not adding salt to his meals normally. He eats hamburgers and hot dog every once in a while - most of the time will eat chicken. He reports enjoying all kinds of vegetables. Drinks: unsweetened iced tea with splenda and sweet n' low. He reports his biggest problem for the last several months has been "loose bowels" - they have given him tests and gave him prilosec which helped, but he is not on it anymore. Recommended revisiting this issue with his MD to see if they can re-prescribe this medication for a new one.  Discussed heart healthy eating.   ?  ? Intervention Plan  ? Intervention Prescribe, educate and counsel regarding individualized specific dietary modifications aiming towards targeted core components such as weight, hypertension, lipid management, diabetes, heart failure and other comorbidities.;Nutrition handout(s) given to patient.   ? Expected Outcomes Short Term Goal: Understand basic principles of dietary content, such as calories, fat, sodium, cholesterol and nutrients.;Short Term Goal: A plan has been developed with personal nutrition goals set during dietitian appointment.;Long Term Goal: Adherence to prescribed nutrition plan.   ? ?  ?  ? ?  ? ? ?Nutrition Discharge: ? ? ?Education Questionnaire Score: ? Knowledge Questionnaire Score - 04/30/21 1142   ? ?  ? Knowledge Questionnaire Score  ? Pre Score 19/26   ? ?  ?  ? ?  ? ? ?Goals reviewed with patient; copy given to patient. ?

## 2021-06-30 NOTE — Progress Notes (Signed)
Cardiac Individual Treatment Plan ? ?Patient Details  ?Name: Justin Warren ?MRN: [id number] ?Date of Birth: 1945-11-20 ?Referring Provider:   ?Flowsheet Row Cardiac Rehab from 04/30/2021 in [facility] Cardiac and Pulmonary Rehab  ?Referring Provider Donnelly Angelica MD  ? ?  ? ? ?Initial Encounter Date:  ?Flowsheet Row Cardiac Rehab from 04/30/2021 in [facility] Cardiac and Pulmonary Rehab  ?Date 04/30/21  ? ?  ? ? ?Visit Diagnosis: NSTEMI (non-ST elevated myocardial infarction) ([place]) ? ?Status post coronary artery stent placement ? ?Patient's Home Medications on Admission: ? ?Current Outpatient Medications:  ?  acetaminophen (TYLENOL) 650 MG CR tablet, Take 650 mg by mouth every 8 (eight) hours as needed for pain., Disp: , Rfl:  ?  atorvastatin (LIPITOR) 80 MG tablet, Take 1 tablet (80 mg total) by mouth daily., Disp: 30 tablet, Rfl: 2 ?  busPIRone (BUSPAR) 15 MG tablet, Take 1 tablet (15 mg total) by mouth 2 (two) times daily., Disp: 180 tablet, Rfl: 0 ?  clopidogrel (PLAVIX) 75 MG tablet, Take 1 tablet (75 mg total) by mouth daily with breakfast. (Patient not taking: Reported on 06/29/2021), Disp: 30 tablet, Rfl: 2 ?  dicyclomine (BENTYL) 20 MG tablet, Take 1 tablet (20 mg total) by mouth 3 (three) times daily before meals. (Patient not taking: Reported on 06/29/2021), Disp: 90 tablet, Rfl: 1 ?  donepezil (ARICEPT) 5 MG tablet, Take 5 mg by mouth daily. (Patient not taking: Reported on 06/29/2021), Disp: , Rfl:  ?  ELIQUIS 5 MG TABS tablet, SMARTSIG:1 Tablet(s) By Mouth Every 12 Hours, Disp: , Rfl:  ?  FLUoxetine (PROZAC) 20 MG capsule, Take 1 capsule (20 mg total) by mouth daily., Disp: 90 capsule, Rfl: 1 ?  losartan (COZAAR) 50 MG tablet, Take 1 tablet (50 mg total) by mouth daily., Disp: 30 tablet, Rfl: 2 ?  metoprolol succinate (TOPROL-XL) 25 MG 24 hr tablet, Take 25 mg by mouth daily., Disp: , Rfl:  ?  omeprazole (PRILOSEC) 40 MG capsule, Take 1 capsule (40 mg total) by mouth daily., Disp: 90 capsule, Rfl: 1 ?  tadalafil (CIALIS) 5 MG  tablet, Take 1 tablet (5 mg total) by mouth daily as needed for erectile dysfunction., Disp: 30 tablet, Rfl: 11 ? ?Past Medical History: ?Past Medical History:  ?Diagnosis Date  ? Anxiety   ? Bradycardia   ? GERD (gastroesophageal reflux disease)   ? History of kidney stones   ? Hyperlipidemia   ? Hypertension   ? Kidney stone   ? Neuropathy   ? ? ?Tobacco Use: ?Social History  ? ?Tobacco Use  ?Smoking Status Former  ? Packs/day: 2.00  ? Years: 30.00  ? Pack years: 60.00  ? Types: Cigarettes  ?Smokeless Tobacco Never  ?Tobacco Comments  ? quit in 1991-1992  ? ? ?Labs: ?Review Flowsheet   ? ?  ?  Latest Ref Rng & Units 11/23/2018 05/23/2019 04/16/2020 11/03/2020  ?Labs for ITP Cardiac and Pulmonary Rehab  ?Cholestrol 0 - 200 mg/dL 181   135   171   163    ?LDL (calc) 0 - 99 mg/dL 103   71   104   99    ?HDL-C >40 mg/dL 41   40   39   35    ?Trlycerides <150 mg/dL 185   138   159   167    ?Hemoglobin A1c 4.8 - 5.6 % 5.1       ? ?  02/27/2021  ?Labs for ITP Cardiac and Pulmonary Rehab  ?Cholestrol 155    ?  LDL (calc) 83    ?HDL-C 45    ?Trlycerides 135    ?Hemoglobin A1c   ?  ? ? Multiple values from one day are sorted in reverse-chronological order  ?  ?  ? ? ? ?Exercise Target Goals: ?Exercise Program Goal: ?Individual exercise prescription set using results from initial 6 min walk test and THRR while considering  patient?s activity barriers and safety.  ? ?Exercise Prescription Goal: ?Initial exercise prescription builds to 30-45 minutes a day of aerobic activity, 2-3 days per week.  Home exercise guidelines will be given to patient during program as part of exercise prescription that the participant will acknowledge. ? ? ?Education: Aerobic Exercise: ?- Group verbal and visual presentation on the components of exercise prescription. Introduces F.I.T.T principle from ACSM for exercise prescriptions.  Reviews F.I.T.T. principles of aerobic exercise including progression. Written material given at graduation. ?Flowsheet Row  Cardiac Rehab from 06/25/2021 in [facility] Cardiac and Pulmonary Rehab  ?Education need identified 04/30/21  ? ?  ? ? ?Education: Resistance Exercise: ?- Group verbal and visual presentation on the components of exercise prescription. Introduces F.I.T.T principle from ACSM for exercise prescriptions  Reviews F.I.T.T. principles of resistance exercise including progression. Written material given at graduation. ? ?  ?Education: Exercise & Equipment Safety: ?- Individual verbal instruction and demonstration of equipment use and safety with use of the equipment. ?Flowsheet Row Cardiac Rehab from 06/25/2021 in [facility] Cardiac and Pulmonary Rehab  ?Date 04/30/21  ?Educator [facility]  ?Instruction Review Code 1- Verbalizes Understanding  ? ?  ? ? ?Education: Exercise Physiology & General Exercise Guidelines: ?- Group verbal and written instruction with models to review the exercise physiology of the cardiovascular system and associated critical values. Provides general exercise guidelines with specific guidelines to those with heart or lung disease.  ?Flowsheet Row Cardiac Rehab from 06/25/2021 in [facility] Cardiac and Pulmonary Rehab  ?Date 05/21/21  ?Educator AS  ?Instruction Review Code 1- Verbalizes Understanding  ? ?  ? ? ?Education: Flexibility, Balance, Mind/Body Relaxation: ?- Group verbal and visual presentation with interactive activity on the components of exercise prescription. Introduces F.I.T.T principle from ACSM for exercise prescriptions. Reviews F.I.T.T. principles of flexibility and balance exercise training including progression. Also discusses the mind body connection.  Reviews various relaxation techniques to help reduce and manage stress (i.e. Deep breathing, progressive muscle relaxation, and visualization). Balance handout provided to take home. Written material given at graduation. ?Flowsheet Row Cardiac Rehab from 06/25/2021 in [facility] Cardiac and Pulmonary Rehab  ?Date 06/18/21  ?Educator AS  ?Instruction Review Code 1-  Verbalizes Understanding  ? ?  ? ? ?Activity Barriers & Risk Stratification: ? Activity Barriers & Cardiac Risk Stratification - 04/30/21 1137   ? ?  ? Activity Barriers & Cardiac Risk Stratification  ? Activity Barriers Other (comment);Back Problems;Right Knee Replacement;Deconditioning;Muscular Weakness;Shortness of Breath;Balance Concerns;History of Falls;Assistive Device   ? Comments bilateral foot drop- wears braces, significant fatigue since MI   ? Cardiac Risk Stratification High   ? ?  ?  ? ?  ? ? ?6 Minute Walk: ? 6 Minute Walk   ? ? [place] Name 04/30/21 1137  ?  ?  ?  ? 6 Minute Walk  ? Phase Initial    ? Distance 594 feet    ? Walk Time 6 minutes    ? # of Rest Breaks 0    ? MPH 1.13    ? METS 0.98    ? RPE 13    ? Perceived  Dyspnea  1    ? VO2 Peak 3.42    ? Symptoms Yes (comment)    ? Comments low back pain 2/10, SOB    ? Resting HR 58 bpm    ? Resting BP 126/64    ? Resting Oxygen Saturation  99 %    ? Exercise Oxygen Saturation  during 6 min walk 95 %    ? Max Ex. HR 84 bpm    ? Max Ex. BP 132/62    ? 2 Minute Post BP 124/62    ? ?  ?  ? ?  ? ? ?Oxygen Initial Assessment: ? ? ?Oxygen Re-Evaluation: ? ? ?Oxygen Discharge (Final Oxygen Re-Evaluation): ? ? ?Initial Exercise Prescription: ? Initial Exercise Prescription - 04/30/21 1100   ? ?  ? Date of Initial Exercise RX and Referring Provider  ? Date 04/30/21   ? Referring Provider Donnelly Angelica MD   ?  ? Oxygen  ? Maintain Oxygen Saturation 88% or higher   ?  ? Treadmill  ? MPH 1   ? Grade 0.5   ? Minutes 15   ? METs 1.8   ?  ? NuStep  ? Level 1   ? SPM 80   ? Minutes 15   ? METs 1.5   ?  ? T5 Nustep  ? Level 1   ? SPM 80   ? Minutes 15   ? METs 1.5   ?  ? Track  ? Laps 15   ? Minutes 15   ? METs 1.82   ?  ? Prescription Details  ? Frequency (times per week) 2   ? Duration Progress to 30 minutes of continuous aerobic without signs/symptoms of physical distress   ?  ? Intensity  ? THRR 40-80% of Max Heartrate 92-125   ? Ratings of Perceived Exertion 11-13    ? Perceived Dyspnea 0-4   ?  ? Progression  ? Progression Continue to progress workloads to maintain intensity without signs/symptoms of physical distress.   ?  ? Resistance Training  ? Training Presc

## 2021-07-07 ENCOUNTER — Encounter: Payer: Self-pay | Admitting: Family Medicine

## 2021-07-07 ENCOUNTER — Ambulatory Visit (INDEPENDENT_AMBULATORY_CARE_PROVIDER_SITE_OTHER): Payer: Medicare HMO | Admitting: Family Medicine

## 2021-07-07 VITALS — BP 126/66 | HR 58 | Temp 98.1°F | Resp 16 | Ht 69.0 in | Wt 197.3 lb

## 2021-07-07 DIAGNOSIS — Z0000 Encounter for general adult medical examination without abnormal findings: Secondary | ICD-10-CM | POA: Diagnosis not present

## 2021-07-07 DIAGNOSIS — I480 Paroxysmal atrial fibrillation: Secondary | ICD-10-CM

## 2021-07-07 DIAGNOSIS — K219 Gastro-esophageal reflux disease without esophagitis: Secondary | ICD-10-CM | POA: Diagnosis not present

## 2021-07-07 DIAGNOSIS — K51911 Ulcerative colitis, unspecified with rectal bleeding: Secondary | ICD-10-CM | POA: Diagnosis not present

## 2021-07-07 DIAGNOSIS — I10 Essential (primary) hypertension: Secondary | ICD-10-CM

## 2021-07-07 DIAGNOSIS — Z1329 Encounter for screening for other suspected endocrine disorder: Secondary | ICD-10-CM

## 2021-07-07 DIAGNOSIS — F419 Anxiety disorder, unspecified: Secondary | ICD-10-CM | POA: Diagnosis not present

## 2021-07-07 DIAGNOSIS — I252 Old myocardial infarction: Secondary | ICD-10-CM

## 2021-07-07 DIAGNOSIS — R739 Hyperglycemia, unspecified: Secondary | ICD-10-CM | POA: Diagnosis not present

## 2021-07-07 DIAGNOSIS — R12 Heartburn: Secondary | ICD-10-CM | POA: Insufficient documentation

## 2021-07-07 DIAGNOSIS — Z125 Encounter for screening for malignant neoplasm of prostate: Secondary | ICD-10-CM

## 2021-07-07 DIAGNOSIS — K589 Irritable bowel syndrome without diarrhea: Secondary | ICD-10-CM | POA: Insufficient documentation

## 2021-07-07 MED ORDER — OMEPRAZOLE 40 MG PO CPDR: 40.0000 mg | DELAYED_RELEASE_CAPSULE | Freq: Every day | ORAL | 3 refills | Status: DC

## 2021-07-07 MED ORDER — DICYCLOMINE HCL 20 MG PO TABS: 20.0000 mg | ORAL_TABLET | Freq: Three times a day (TID) | ORAL | 3 refills | Status: DC

## 2021-07-07 MED ORDER — ATORVASTATIN CALCIUM 80 MG PO TABS: 80.0000 mg | ORAL_TABLET | Freq: Every day | ORAL | 3 refills | Status: DC

## 2021-07-07 MED ORDER — CLOPIDOGREL BISULFATE 75 MG PO TABS: 75.0000 mg | ORAL_TABLET | Freq: Every day | ORAL | 3 refills | Status: DC

## 2021-07-07 MED ORDER — METOPROLOL SUCCINATE ER 25 MG PO TB24: 25.0000 mg | ORAL_TABLET | Freq: Every day | ORAL | 3 refills | Status: DC

## 2021-07-07 MED ORDER — FLUOXETINE HCL 20 MG PO CAPS: 20.0000 mg | ORAL_CAPSULE | Freq: Every day | ORAL | 3 refills | Status: DC

## 2021-07-07 MED ORDER — LOSARTAN POTASSIUM 50 MG PO TABS: 50.0000 mg | ORAL_TABLET | Freq: Every day | ORAL | 3 refills | Status: DC

## 2021-07-07 MED ORDER — BUSPIRONE HCL 7.5 MG PO TABS: 7.5000 mg | ORAL_TABLET | Freq: Two times a day (BID) | ORAL | 3 refills | Status: DC

## 2021-07-07 MED ORDER — ELIQUIS 5 MG PO TABS: 5.0000 mg | ORAL_TABLET | Freq: Two times a day (BID) | ORAL | 3 refills | Status: DC

## 2021-07-07 NOTE — Assessment & Plan Note (Signed)
Chronic, stable ?Continue PPI; pt notes GI symptoms- loose, watery diarrhea, continue to improve with stable use of PPI ?

## 2021-07-07 NOTE — Assessment & Plan Note (Signed)
Chronic, stable ?Denies CP ?Denies SOB/ DOE ?Denies low blood pressure/hypotension ?Denies vision changes ?No LE Edema noted on exam ?Continue medication, Losartan 50 mg and Toprol 25 mg ?RTC in 6 months ?Seek emergent care if you develop chest pain or chest pressure ? ?

## 2021-07-07 NOTE — Progress Notes (Signed)
? ?[employer] as a Education administrator for Gwyneth Sprout, FNP.,have documented all relevant documentation on the behalf of Gwyneth Sprout, FNP,as directed by  Gwyneth Sprout, FNP while in the presence of Gwyneth Sprout, FNP.  ? ?Complete physical exam ? ? ?Patient: Justin Warren   DOB: 1946/03/06   76 y.o. Male  MRN: [id number] ?Visit Date: 07/07/2021 ? ?Today's healthcare provider: Gwyneth Sprout, FNP  ? ?Re-Introduced to nurse practitioner role and practice setting.  All questions answered.  Discussed provider/patient relationship and expectations. ? ? ?Chief Complaint  ?Patient presents with  ? Annual Exam  ? ?Subjective  ?  ?HPI  ?Justin Warren is a 76 y.o. male who presents today for a complete physical exam.  ?He reports consuming a general diet. The patient does not participate in regular exercise at present. He generally feels well. He reports sleeping well. He does not have additional problems to discuss today.  ? ?NHA-06/29/2021 ? ?Past Medical History:  ?Diagnosis Date  ? Anxiety   ? Bradycardia   ? GERD (gastroesophageal reflux disease)   ? History of kidney stones   ? Hyperlipidemia   ? Hypertension   ? Kidney stone   ? Neuropathy   ? ?Past Surgical History:  ?Procedure Laterality Date  ? APPENDECTOMY  1994  ? BACK SURGERY    ? CARPAL TUNNEL RELEASE Right 2012  ? CARPAL TUNNEL RELEASE Left 02/06/2020  ? Procedure: CARPAL TUNNEL RELEASE;  Surgeon: Earnestine Leys, MD;  Location: [facility];  Service: Orthopedics;  Laterality: Left;  BLOCK  ? CATARACT EXTRACTION, BILATERAL  2019  ? COLONOSCOPY WITH PROPOFOL N/A 07/09/2020  ? Procedure: COLONOSCOPY WITH PROPOFOL;  Surgeon: Lin Landsman, MD;  Location: [facility];  Service: Gastroenterology;  Laterality: N/A;  ? CORONARY STENT INTERVENTION N/A 02/27/2021  ? Procedure: CORONARY STENT INTERVENTION;  Surgeon: Yolonda Kida, MD;  Location: [facility];  Service: Cardiovascular;  Laterality: N/A;  ? CYSTOSCOPY W/ RETROGRADES Right 11/30/2019  ?  Procedure: CYSTOSCOPY WITH RETROGRADE PYELOGRAM;  Surgeon: [name], MD;  Location: [facility];  Service: Urology;  Laterality: Right;  ? CYSTOSCOPY/URETEROSCOPY/HOLMIUM LASER/STENT PLACEMENT Right 11/30/2019  ? Procedure: CYSTOSCOPY/URETEROSCOPY/HOLMIUM LASER/STENT PLACEMENT;  Surgeon: [name], MD;  Location: [facility];  Service: Urology;  Laterality: Right;  ? ESOPHAGOGASTRODUODENOSCOPY (EGD) WITH PROPOFOL N/A 07/09/2020  ? Procedure: ESOPHAGOGASTRODUODENOSCOPY (EGD) WITH PROPOFOL;  Surgeon: Lin Landsman, MD;  Location: [facility];  Service: Gastroenterology;  Laterality: N/A;  ? EYE SURGERY    ? INSERT / REPLACE / REMOVE PACEMAKER    ? JOINT REPLACEMENT    ? LEFT HEART CATH AND CORONARY ANGIOGRAPHY N/A 02/27/2021  ? Procedure: LEFT HEART CATH AND CORONARY ANGIOGRAPHY possible PCI and stent;  Surgeon: Yolonda Kida, MD;  Location: [facility];  Service: Cardiovascular;  Laterality: N/A;  ? [place]  ? Dr. [name]  ? PACEMAKER INSERTION Left 04/20/2018  ? Procedure: INSERTION PACEMAKER-DUAL CHAMBER;  Surgeon: [name], MD;  Location: [facility];  Service: Cardiovascular;  Laterality: Left;  ? 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 SIGMOIDOSCOPY  2010  ? TRIGGER FINGER RELEASE Left 02/06/2020  ? Procedure: RELEASE TRIGGER FINGER/A-1 PULLEY;  Surgeon: Earnestine Leys, MD;  Location: [facility];  Service: Orthopedics;  Laterality: Left;  ? ?Social History  ? ?Socioeconomic History  ? Marital status: Married  ?  Spouse name: Not on file  ? Number of children: 3  ?  Years of education: Not on file  ? Highest education level: Bachelor's degree (e.g., BA, AB, BS)  ?Occupational History  ? Occupation: retired  ?Tobacco Use  ? Smoking status: Former  ?  Packs/day: 2.00  ?  Years: 30.00  ?  Pack years: 60.00  ?  Types: Cigarettes  ? Smokeless tobacco: Never  ? Tobacco comments:  ?  quit in 1991-1992  ?Vaping Use  ? Vaping Use: Never used  ?Substance and Sexual  Activity  ? Alcohol use: Not Currently  ?  Alcohol/week: 0.0 standard drinks  ?  Comment: quit over 30 years ago  ? Drug use: No  ? Sexual activity: Yes  ?  Birth control/protection: None  ?Other Topics Concern  ? Not on file  ?Social History Narrative  ? Not on file  ? ?Social Determinants of Health  ? ?Financial Resource Strain: Low Risk   ? Difficulty of Paying Living Expenses: Not hard at all  ?Food Insecurity: No Food Insecurity  ? Worried About Charity fundraiser in the Last Year: Never true  ? Ran Out of Food in the Last Year: Never true  ?Transportation Needs: No Transportation Needs  ? Lack of Transportation (Medical): No  ? Lack of Transportation (Non-Medical): No  ?Physical Activity: Insufficiently Active  ? Days of Exercise per Week: 2 days  ? Minutes of Exercise per Session: 20 min  ?Stress: No Stress Concern Present  ? Feeling of Stress : Not at all  ?Social Connections: Moderately Integrated  ? Frequency of Communication with Friends and Family: More than three times a week  ? Frequency of Social Gatherings with Friends and Family: Three times a week  ? Attends Religious Services: More than 4 times per year  ? Active Member of Clubs or Organizations: No  ? Attends Archivist Meetings: Never  ? Marital Status: Married  ?Intimate Partner Violence: Not At Risk  ? Fear of Current or Ex-Partner: No  ? Emotionally Abused: No  ? Physically Abused: No  ? Sexually Abused: No  ? ?Family Status  ?Relation Name Status  ? Mother  Deceased at age 67  ? Father  Deceased at age 70  ? Sister  Alive  ? Daughter  (Not Specified)  ?     blood clot  ? ?Family History  ?Problem Relation Age of Onset  ? Dementia Mother   ? GER disease Mother   ? Hypertension Father   ? Arrhythmia Sister   ? Heart murmur Sister   ? ?Allergies  ?Allergen Reactions  ? Amoxicillin-Pot Clavulanate Nausea And Vomiting  ?  DID THE REACTION INVOLVE: Swelling of the face/tongue/throat, SOB, or low BP? No ?Sudden or severe rash/hives, skin  peeling, or the inside of the mouth or nose? No ?Did it require medical treatment? No ?When did it last happen? Over 10 years ago ?If all above answers are ?NO?, may proceed with cephalosporin use.  ?  ?Patient Care Team: ?Gwyneth Sprout, FNP as PCP - General (Family Medicine) ?Patty, A. Joneen Caraway, MD as Consulting Physician (Ophthalmology) ?Teodoro Spray, MD as Consulting Physician (Cardiology) ?Lovell Sheehan, MD as Consulting Physician (Orthopedic Surgery) ?Anabel Bene, MD as Referring Physician (Neurology)  ? ?Medications: ?Outpatient Medications Prior to Visit  ?Medication Sig  ? acetaminophen (TYLENOL) 650 MG CR tablet Take 650 mg by mouth every 8 (eight) hours as needed for pain.  ? [DISCONTINUED] atorvastatin (LIPITOR) 80 MG tablet Take 1 tablet (80 mg total) by mouth daily.  ? [  DISCONTINUED] busPIRone (BUSPAR) 15 MG tablet Take 1 tablet (15 mg total) by mouth 2 (two) times daily.  ? [DISCONTINUED] clopidogrel (PLAVIX) 75 MG tablet Take 1 tablet (75 mg total) by mouth daily with breakfast.  ? [DISCONTINUED] dicyclomine (BENTYL) 20 MG tablet Take 1 tablet (20 mg total) by mouth 3 (three) times daily before meals.  ? [DISCONTINUED] ELIQUIS 5 MG TABS tablet SMARTSIG:1 Tablet(s) By Mouth Every 12 Hours  ? [DISCONTINUED] FLUoxetine (PROZAC) 20 MG capsule Take 1 capsule (20 mg total) by mouth daily.  ? [DISCONTINUED] losartan (COZAAR) 50 MG tablet Take 1 tablet (50 mg total) by mouth daily.  ? [DISCONTINUED] metoprolol succinate (TOPROL-XL) 25 MG 24 hr tablet Take 25 mg by mouth daily.  ? [DISCONTINUED] omeprazole (PRILOSEC) 40 MG capsule Take 1 capsule (40 mg total) by mouth daily.  ? [DISCONTINUED] donepezil (ARICEPT) 5 MG tablet Take 5 mg by mouth daily. (Patient not taking: Reported on 07/07/2021)  ? [DISCONTINUED] tadalafil (CIALIS) 5 MG tablet Take 1 tablet (5 mg total) by mouth daily as needed for erectile dysfunction. (Patient not taking: Reported on 07/07/2021)  ? ?No facility-administered medications  prior to visit.  ? ? ?Review of Systems  ?Constitutional:  Positive for fatigue.  ?Eyes:  Positive for discharge.  ?Respiratory:  Positive for shortness of breath.   ?Gastrointestinal:  Positive for

## 2021-07-07 NOTE — Assessment & Plan Note (Addendum)
Chronic; improving as- Pt notes improvement in GI symptoms since last visit; has visit with GI later this Spring/Summer ? ?

## 2021-07-07 NOTE — Assessment & Plan Note (Signed)
Patient has stopped going to c/t rehab; plans to go to [employer] with his [employer] membership and do his bike 2x/week ?-does not want additional vaccines; including Shingles ?-reports some difficulty with noise, not wearing hearing aids today and minimal hearing loss noted during exam ?Things to do to keep yourself healthy  ?- Exercise at least 30-45 minutes a day, 3-4 days a week.  ?- Eat a low-fat diet with lots of fruits and vegetables, up to 7-9 servings per day.  ?- Seatbelts can save your life. Wear them always.  ?- Smoke detectors on every level of your home, check batteries every year.  ?- Eye Doctor - have an eye exam every 1-2 years  ?- Safe sex - if you may be exposed to STDs, use a condom.  ?- Alcohol -  If you drink, do it moderately, less than 2 drinks per day.  ?- [place]am. Choose someone to speak for you if you are not able.  ?- Depression is common in our stressful world.If you're feeling down or losing interest in things you normally enjoy, please come in for a visit.  ?- Violence - If anyone is threatening or hurting you, please call immediately. ? ? ?

## 2021-07-07 NOTE — Assessment & Plan Note (Signed)
Denies urinary difficulties/concerns; will check PSA for prostate cancer ?

## 2021-07-07 NOTE — Assessment & Plan Note (Signed)
Hx of elevated glucose; seen on previous lab results ?Will check A1c today ?Continue to recommend balanced, lower carb meals. Smaller meal size, adding snacks. Choosing water as drink of choice and increasing purposeful exercise. ? ?

## 2021-07-07 NOTE — Assessment & Plan Note (Signed)
Normal thyroid on exam ?Will check TSH and Free T4 d/t CPE, hx of anxiety/palpitations  ?

## 2021-07-07 NOTE — Assessment & Plan Note (Signed)
Chronic, stable ?SB today on examination ?Remains on Eliquis for stoke prevention 5 mg BID and Toprol 25 mg ?Denies SOB or DOE ?F/b cards ?

## 2021-07-07 NOTE — Assessment & Plan Note (Signed)
Chronic, stable ?Patient request to d/c or lower dose of buspar ?Patient notes being on medication since he quit smoking; if this is true, that is > 30 year use ?Agree to reduce dose to 7.5 mg BID; patient to let PCP know if symptoms of GAD return ?Denies SI or HI ?

## 2021-07-07 NOTE — Assessment & Plan Note (Signed)
Previously in [facility] rehab to assist in recovery ?On Statin ?On Plavix ?On Beta Blocker ?

## 2021-07-08 LAB — COMPREHENSIVE METABOLIC PANEL
ALT: 26 IU/L (ref 0–44)
AST: 26 IU/L (ref 0–40)
Albumin/Globulin Ratio: 2 (ref 1.2–2.2)
Albumin: 4.6 g/dL (ref 3.7–4.7)
Alkaline Phosphatase: 154 IU/L — ABNORMAL HIGH (ref 44–121)
BUN/Creatinine Ratio: 10 (ref 10–24)
BUN: 9 mg/dL (ref 8–27)
Bilirubin Total: 0.7 mg/dL (ref 0.0–1.2)
CO2: 23 mmol/L (ref 20–29)
Calcium: 9.6 mg/dL (ref 8.6–10.2)
Chloride: 102 mmol/L (ref 96–106)
Creatinine, Ser: 0.87 mg/dL (ref 0.76–1.27)
Globulin, Total: 2.3 g/dL (ref 1.5–4.5)
Glucose: 115 mg/dL — ABNORMAL HIGH (ref 70–99)
Potassium: 4.2 mmol/L (ref 3.5–5.2)
Sodium: 142 mmol/L (ref 134–144)
Total Protein: 6.9 g/dL (ref 6.0–8.5)
eGFR: 88 mL/min/{1.73_m2} (ref >59)

## 2021-07-08 LAB — CBC WITH DIFFERENTIAL/PLATELET
Basophils Absolute: 0.1 10*3/uL (ref 0.0–0.2)
Basos: 1 %
EOS (ABSOLUTE): 0.2 10*3/uL (ref 0.0–0.4)
Eos: 3 %
Hematocrit: 44.4 % (ref 37.5–51.0)
Hemoglobin: 15.1 g/dL (ref 13.0–17.7)
Immature Grans (Abs): 0 10*3/uL (ref 0.0–0.1)
Immature Granulocytes: 0 %
Lymphocytes Absolute: 1.6 10*3/uL (ref 0.7–3.1)
Lymphs: 24 %
MCH: 28.1 pg (ref 26.6–33.0)
MCHC: 34 g/dL (ref 31.5–35.7)
MCV: 83 fL (ref 79–97)
Monocytes Absolute: 0.6 10*3/uL (ref 0.1–0.9)
Monocytes: 9 %
Neutrophils Absolute: 4.3 10*3/uL (ref 1.4–7.0)
Neutrophils: 63 %
Platelets: 274 10*3/uL (ref 150–450)
RBC: 5.38 x10E6/uL (ref 4.14–5.80)
RDW: 13.9 % (ref 11.6–15.4)
WBC: 6.7 10*3/uL (ref 3.4–10.8)

## 2021-07-08 LAB — TSH+FREE T4
Free T4: 1.19 ng/dL (ref 0.82–1.77)
TSH: 1.16 u[IU]/mL (ref 0.450–4.500)

## 2021-07-08 LAB — HEMOGLOBIN A1C
Est. average glucose Bld gHb Est-mCnc: 108 mg/dL
Hgb A1c MFr Bld: 5.4 % (ref 4.8–5.6)

## 2021-07-08 LAB — PSA: Prostate Specific Ag, Serum: 0.6 ng/mL (ref 0.0–4.0)

## 2021-07-12 ENCOUNTER — Other Ambulatory Visit: Payer: Self-pay | Admitting: Family Medicine

## 2021-07-12 DIAGNOSIS — R059 Cough, unspecified: Secondary | ICD-10-CM | POA: Diagnosis not present

## 2021-07-12 DIAGNOSIS — Z20822 Contact with and (suspected) exposure to covid-19: Secondary | ICD-10-CM | POA: Diagnosis not present

## 2021-07-12 DIAGNOSIS — K51911 Ulcerative colitis, unspecified with rectal bleeding: Secondary | ICD-10-CM

## 2021-07-12 DIAGNOSIS — J209 Acute bronchitis, unspecified: Secondary | ICD-10-CM | POA: Diagnosis not present

## 2021-07-16 ENCOUNTER — Ambulatory Visit: Payer: Self-pay | Admitting: Urology

## 2021-07-23 DIAGNOSIS — L570 Actinic keratosis: Secondary | ICD-10-CM | POA: Diagnosis not present

## 2021-07-23 DIAGNOSIS — Z85828 Personal history of other malignant neoplasm of skin: Secondary | ICD-10-CM | POA: Diagnosis not present

## 2021-07-23 DIAGNOSIS — L820 Inflamed seborrheic keratosis: Secondary | ICD-10-CM | POA: Diagnosis not present

## 2021-07-23 DIAGNOSIS — D2261 Melanocytic nevi of right upper limb, including shoulder: Secondary | ICD-10-CM | POA: Diagnosis not present

## 2021-07-23 DIAGNOSIS — L298 Other pruritus: Secondary | ICD-10-CM | POA: Diagnosis not present

## 2021-07-23 DIAGNOSIS — D2271 Melanocytic nevi of right lower limb, including hip: Secondary | ICD-10-CM | POA: Diagnosis not present

## 2021-07-23 DIAGNOSIS — X32XXXA Exposure to sunlight, initial encounter: Secondary | ICD-10-CM | POA: Diagnosis not present

## 2021-07-23 DIAGNOSIS — C4442 Squamous cell carcinoma of skin of scalp and neck: Secondary | ICD-10-CM | POA: Diagnosis not present

## 2021-07-23 DIAGNOSIS — D485 Neoplasm of uncertain behavior of skin: Secondary | ICD-10-CM | POA: Diagnosis not present

## 2021-07-23 DIAGNOSIS — D2262 Melanocytic nevi of left upper limb, including shoulder: Secondary | ICD-10-CM | POA: Diagnosis not present

## 2021-08-11 DIAGNOSIS — K219 Gastro-esophageal reflux disease without esophagitis: Secondary | ICD-10-CM | POA: Diagnosis not present

## 2021-08-11 DIAGNOSIS — K591 Functional diarrhea: Secondary | ICD-10-CM | POA: Diagnosis not present

## 2021-08-11 DIAGNOSIS — K625 Hemorrhage of anus and rectum: Secondary | ICD-10-CM | POA: Diagnosis not present

## 2021-08-11 DIAGNOSIS — K2270 Barrett's esophagus without dysplasia: Secondary | ICD-10-CM | POA: Diagnosis not present

## 2021-08-13 IMAGING — CR DG ABDOMEN 1V
2 series · 2 of 2 positions shown · non-contrast
Comparison: CTA abdomen and pelvis, 11/24/2019

CLINICAL DATA: Nephrolithiasis

EXAM:
ABDOMEN - 1 VIEW

[abdomen kub (1 of 2)]
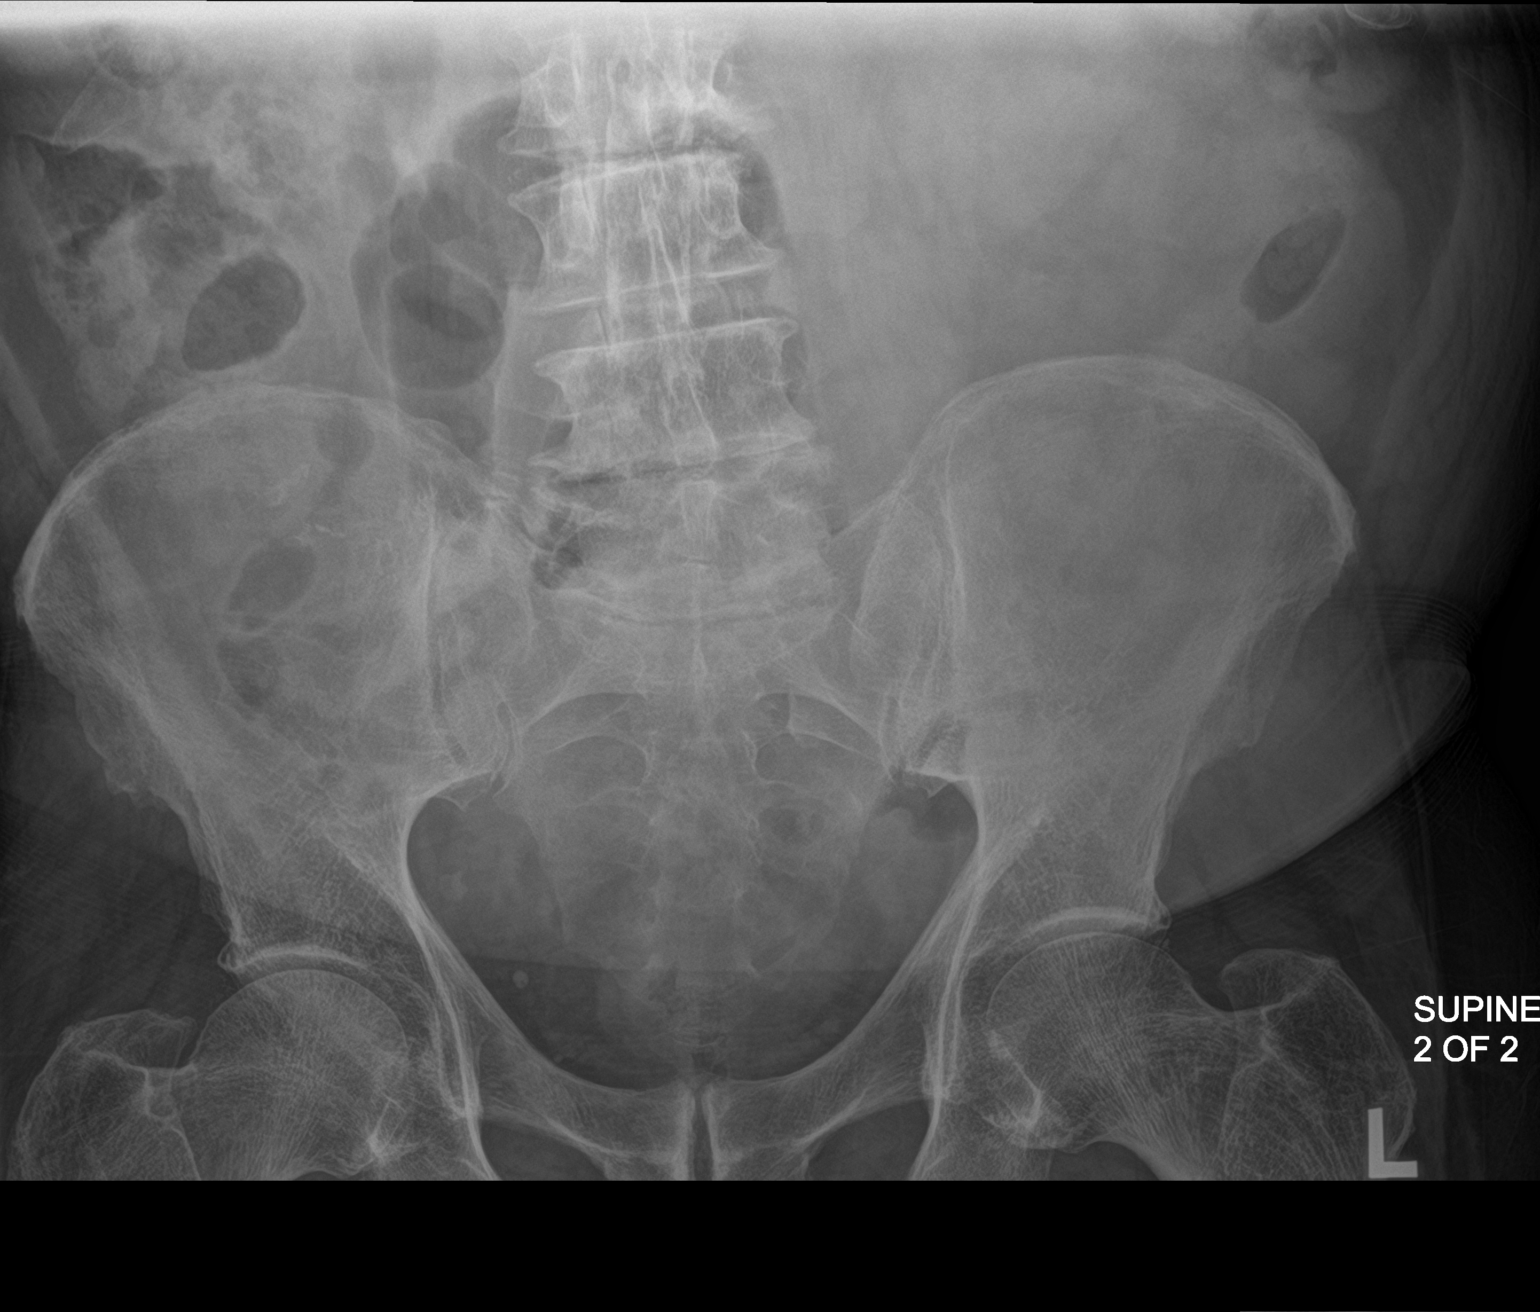

[abdomen kub (2 of 2)]
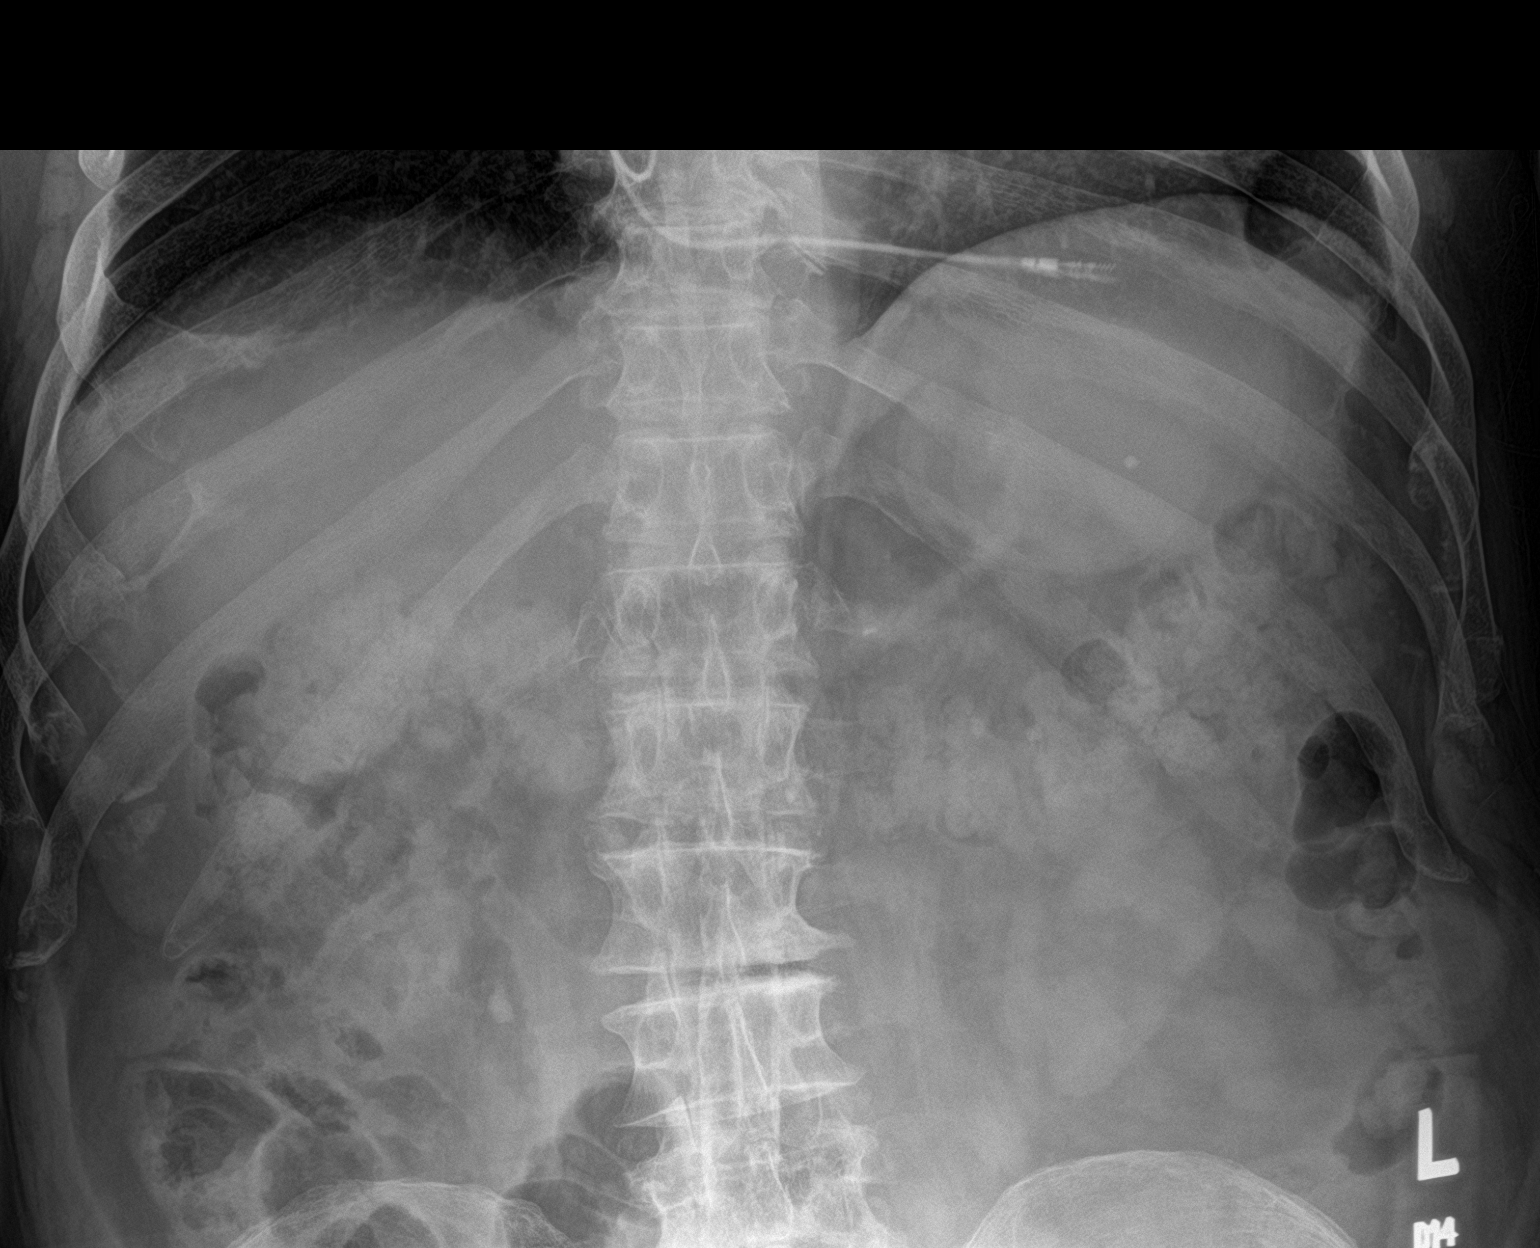

[2 of 2 positions shown; findings below may reference images not displayed]

FINDINGS: Nonobstructive pattern of bowel gas. No appreciable change in
position of multiple bilateral renal calculi and an 8 mm calculus
projecting over the the most proximal right ureter.
IMPRESSION: No appreciable change in position of multiple bilateral renal
calculi and an 8 mm calculus projecting over the most proximal right
ureter, better assessed by prior CT.

## 2021-08-19 DIAGNOSIS — K591 Functional diarrhea: Secondary | ICD-10-CM | POA: Diagnosis not present

## 2021-08-19 DIAGNOSIS — K625 Hemorrhage of anus and rectum: Secondary | ICD-10-CM | POA: Diagnosis not present

## 2021-08-26 DIAGNOSIS — I4589 Other specified conduction disorders: Secondary | ICD-10-CM | POA: Diagnosis not present

## 2021-08-26 DIAGNOSIS — I214 Non-ST elevation (NSTEMI) myocardial infarction: Secondary | ICD-10-CM | POA: Diagnosis not present

## 2021-08-26 DIAGNOSIS — R001 Bradycardia, unspecified: Secondary | ICD-10-CM | POA: Diagnosis not present

## 2021-08-26 DIAGNOSIS — R0602 Shortness of breath: Secondary | ICD-10-CM | POA: Diagnosis not present

## 2021-08-26 DIAGNOSIS — E782 Mixed hyperlipidemia: Secondary | ICD-10-CM | POA: Diagnosis not present

## 2021-09-14 ENCOUNTER — Telehealth: Payer: Self-pay | Admitting: Family Medicine

## 2021-09-14 DIAGNOSIS — K219 Gastro-esophageal reflux disease without esophagitis: Secondary | ICD-10-CM

## 2021-09-14 MED ORDER — OMEPRAZOLE 40 MG PO CPDR: 40.0000 mg | DELAYED_RELEASE_CAPSULE | Freq: Every day | ORAL | 3 refills | Status: AC

## 2021-09-14 NOTE — Telephone Encounter (Signed)
[facility] faxed refill request for the following medications:  omeprazole (PRILOSEC) 40 MG capsule   Please advise

## 2021-09-16 DIAGNOSIS — I442 Atrioventricular block, complete: Secondary | ICD-10-CM | POA: Diagnosis not present

## 2021-09-17 DIAGNOSIS — R0602 Shortness of breath: Secondary | ICD-10-CM | POA: Diagnosis not present

## 2021-09-17 DIAGNOSIS — I214 Non-ST elevation (NSTEMI) myocardial infarction: Secondary | ICD-10-CM | POA: Diagnosis not present

## 2021-09-22 DIAGNOSIS — C4442 Squamous cell carcinoma of skin of scalp and neck: Secondary | ICD-10-CM | POA: Diagnosis not present

## 2021-09-23 DIAGNOSIS — R0602 Shortness of breath: Secondary | ICD-10-CM | POA: Diagnosis not present

## 2021-09-23 DIAGNOSIS — I480 Paroxysmal atrial fibrillation: Secondary | ICD-10-CM | POA: Diagnosis not present

## 2021-09-23 DIAGNOSIS — I442 Atrioventricular block, complete: Secondary | ICD-10-CM | POA: Diagnosis not present

## 2021-09-23 DIAGNOSIS — R001 Bradycardia, unspecified: Secondary | ICD-10-CM | POA: Diagnosis not present

## 2021-09-23 DIAGNOSIS — I214 Non-ST elevation (NSTEMI) myocardial infarction: Secondary | ICD-10-CM | POA: Diagnosis not present

## 2021-09-23 DIAGNOSIS — I4589 Other specified conduction disorders: Secondary | ICD-10-CM | POA: Diagnosis not present

## 2021-09-23 DIAGNOSIS — I517 Cardiomegaly: Secondary | ICD-10-CM | POA: Diagnosis not present

## 2021-10-13 ENCOUNTER — Ambulatory Visit: Payer: Medicare HMO | Admitting: Gastroenterology

## 2021-10-21 ENCOUNTER — Telehealth: Payer: Self-pay | Admitting: Family Medicine

## 2021-10-21 NOTE — Telephone Encounter (Signed)
Pt reported not taking aricept 07/07/21. Pt is taking omeprazole at this time.  KP

## 2021-10-21 NOTE — Telephone Encounter (Signed)
[facility] faxed refill request for the following medications:  donepezil (ARICEPT) 5 MG tablet   pantoprazole (PROTONIX) 20 MG tablet   Please advise.

## 2021-10-23 DIAGNOSIS — Z955 Presence of coronary angioplasty implant and graft: Secondary | ICD-10-CM | POA: Diagnosis not present

## 2021-10-23 DIAGNOSIS — R0609 Other forms of dyspnea: Secondary | ICD-10-CM | POA: Diagnosis not present

## 2021-10-23 DIAGNOSIS — I252 Old myocardial infarction: Secondary | ICD-10-CM | POA: Diagnosis not present

## 2021-10-23 DIAGNOSIS — I517 Cardiomegaly: Secondary | ICD-10-CM | POA: Diagnosis not present

## 2021-10-23 DIAGNOSIS — E785 Hyperlipidemia, unspecified: Secondary | ICD-10-CM | POA: Diagnosis not present

## 2021-10-23 DIAGNOSIS — R0602 Shortness of breath: Secondary | ICD-10-CM | POA: Diagnosis not present

## 2021-10-23 DIAGNOSIS — Z950 Presence of cardiac pacemaker: Secondary | ICD-10-CM | POA: Diagnosis not present

## 2021-10-23 DIAGNOSIS — G629 Polyneuropathy, unspecified: Secondary | ICD-10-CM | POA: Diagnosis not present

## 2021-10-23 DIAGNOSIS — R001 Bradycardia, unspecified: Secondary | ICD-10-CM | POA: Diagnosis not present

## 2021-10-23 DIAGNOSIS — I2510 Atherosclerotic heart disease of native coronary artery without angina pectoris: Secondary | ICD-10-CM | POA: Diagnosis not present

## 2021-10-23 DIAGNOSIS — I480 Paroxysmal atrial fibrillation: Secondary | ICD-10-CM | POA: Diagnosis not present

## 2021-10-23 DIAGNOSIS — I119 Hypertensive heart disease without heart failure: Secondary | ICD-10-CM | POA: Diagnosis not present

## 2021-11-13 DIAGNOSIS — R0609 Other forms of dyspnea: Secondary | ICD-10-CM | POA: Diagnosis not present

## 2021-11-13 DIAGNOSIS — I517 Cardiomegaly: Secondary | ICD-10-CM | POA: Diagnosis not present

## 2021-11-13 DIAGNOSIS — I5032 Chronic diastolic (congestive) heart failure: Secondary | ICD-10-CM | POA: Diagnosis not present

## 2021-11-13 DIAGNOSIS — I442 Atrioventricular block, complete: Secondary | ICD-10-CM | POA: Diagnosis not present

## 2021-11-13 DIAGNOSIS — I214 Non-ST elevation (NSTEMI) myocardial infarction: Secondary | ICD-10-CM | POA: Diagnosis not present

## 2021-11-13 DIAGNOSIS — I739 Peripheral vascular disease, unspecified: Secondary | ICD-10-CM | POA: Diagnosis not present

## 2021-11-13 DIAGNOSIS — I480 Paroxysmal atrial fibrillation: Secondary | ICD-10-CM | POA: Diagnosis not present

## 2021-11-27 DIAGNOSIS — E854 Organ-limited amyloidosis: Secondary | ICD-10-CM | POA: Diagnosis not present

## 2021-11-27 DIAGNOSIS — I517 Cardiomegaly: Secondary | ICD-10-CM | POA: Diagnosis not present

## 2021-11-27 DIAGNOSIS — I43 Cardiomyopathy in diseases classified elsewhere: Secondary | ICD-10-CM | POA: Diagnosis not present

## 2021-12-02 DIAGNOSIS — I480 Paroxysmal atrial fibrillation: Secondary | ICD-10-CM | POA: Diagnosis not present

## 2021-12-02 DIAGNOSIS — G629 Polyneuropathy, unspecified: Secondary | ICD-10-CM | POA: Diagnosis not present

## 2021-12-02 DIAGNOSIS — I442 Atrioventricular block, complete: Secondary | ICD-10-CM | POA: Diagnosis not present

## 2021-12-02 DIAGNOSIS — I739 Peripheral vascular disease, unspecified: Secondary | ICD-10-CM | POA: Diagnosis not present

## 2021-12-02 DIAGNOSIS — E854 Organ-limited amyloidosis: Secondary | ICD-10-CM | POA: Diagnosis not present

## 2021-12-02 DIAGNOSIS — I255 Ischemic cardiomyopathy: Secondary | ICD-10-CM | POA: Diagnosis not present

## 2021-12-02 DIAGNOSIS — I350 Nonrheumatic aortic (valve) stenosis: Secondary | ICD-10-CM | POA: Diagnosis not present

## 2021-12-02 DIAGNOSIS — R200 Anesthesia of skin: Secondary | ICD-10-CM | POA: Diagnosis not present

## 2021-12-02 DIAGNOSIS — Z23 Encounter for immunization: Secondary | ICD-10-CM | POA: Diagnosis not present

## 2021-12-02 DIAGNOSIS — I43 Cardiomyopathy in diseases classified elsewhere: Secondary | ICD-10-CM | POA: Diagnosis not present

## 2021-12-02 DIAGNOSIS — Z950 Presence of cardiac pacemaker: Secondary | ICD-10-CM | POA: Diagnosis not present

## 2021-12-02 DIAGNOSIS — E0789 Other specified disorders of thyroid: Secondary | ICD-10-CM | POA: Diagnosis not present

## 2021-12-02 DIAGNOSIS — R001 Bradycardia, unspecified: Secondary | ICD-10-CM | POA: Diagnosis not present

## 2021-12-02 DIAGNOSIS — I214 Non-ST elevation (NSTEMI) myocardial infarction: Secondary | ICD-10-CM | POA: Diagnosis not present

## 2021-12-02 DIAGNOSIS — I5032 Chronic diastolic (congestive) heart failure: Secondary | ICD-10-CM | POA: Diagnosis not present

## 2021-12-02 DIAGNOSIS — E859 Amyloidosis, unspecified: Secondary | ICD-10-CM | POA: Diagnosis not present

## 2021-12-02 DIAGNOSIS — E782 Mixed hyperlipidemia: Secondary | ICD-10-CM | POA: Diagnosis not present

## 2021-12-17 DIAGNOSIS — I442 Atrioventricular block, complete: Secondary | ICD-10-CM | POA: Diagnosis not present

## 2022-01-07 ENCOUNTER — Ambulatory Visit: Payer: Medicare HMO | Admitting: Family Medicine

## 2022-01-13 ENCOUNTER — Ambulatory Visit
Admission: RE | Admit: 2022-01-13 | Discharge: 2022-01-13 | Disposition: A | Discharge status: Home / Self Care | Payer: Medicare HMO | Attending: Family Medicine | Admitting: Family Medicine

## 2022-01-13 ENCOUNTER — Encounter: Payer: Self-pay | Admitting: Family Medicine

## 2022-01-13 ENCOUNTER — Ambulatory Visit (INDEPENDENT_AMBULATORY_CARE_PROVIDER_SITE_OTHER): Payer: Medicare HMO | Admitting: Family Medicine

## 2022-01-13 ENCOUNTER — Ambulatory Visit
Admission: RE | Admit: 2022-01-13 | Discharge: 2022-01-13 | Disposition: A | Discharge status: Home / Self Care | Payer: Medicare HMO | Source: Ambulatory Visit | Attending: Family Medicine | Admitting: Family Medicine

## 2022-01-13 VITALS — BP 129/77 | HR 74 | Temp 98.6°F | Resp 16 | Wt 199.0 lb

## 2022-01-13 DIAGNOSIS — R0781 Pleurodynia: Secondary | ICD-10-CM | POA: Insufficient documentation

## 2022-01-13 DIAGNOSIS — J439 Emphysema, unspecified: Secondary | ICD-10-CM

## 2022-01-13 MED ORDER — BENZONATATE 200 MG PO CAPS: 200.0000 mg | ORAL_CAPSULE | Freq: Two times a day (BID) | ORAL | 0 refills | Status: DC | PRN

## 2022-01-13 MED ORDER — DM-GUAIFENESIN ER 30-600 MG PO TB12: 1.0000 | ORAL_TABLET | Freq: Two times a day (BID) | ORAL | 1 refills | Status: DC | PRN

## 2022-01-13 MED ORDER — PREDNISONE 10 MG (21) PO TBPK: ORAL_TABLET | ORAL | 0 refills | Status: DC

## 2022-01-13 NOTE — Progress Notes (Signed)
   SUBJECTIVE:   CHIEF COMPLAINT / HPI:   COUGH - a little nasal congestion, runny nose - h/o CAD s/p DES, NSTEMI, PAF, CHB s/p pacemaker - R side pain 3-4 days with cough, now TTP - did not test for COVID  Duration: 1 week Circumstances of initial development of cough: unknown Cough description: productive of mucus Aggravating factors:  worse at night Alleviating factors: mucinex a little, not helping as much now Status:  worse Treatments attempted: tessalon, mucinex DM  Shortness of breath: no Chest pain: no Chest tightness:no Nasal congestion: no Runny nose: yes Frequent throat clearing or swallowing: yes Hemoptysis: no Fevers: no Heartburn: no Recent foreign travel: no Sick contacts: yes   OBJECTIVE:   BP 129/77 (BP Location: Right Arm, Patient Position: Sitting, Cuff Size: Normal)   Pulse 74   Temp 98.6 F (37 C) (Oral)   Resp 16   Wt 199 lb (90.3 kg)   SpO2 96%   BMI 29.39 kg/m   Gen: well appearing, in NAD Card: RRR Lungs: CTAB Ext: WWP, no edema  ASSESSMENT/PLAN:   Cough Likely started as URI given some runny nose, productive cough, +sick contact. No evidence of PNA, sinus infection, no current indication for antibiotics. Outside of quarantine and treatment window, will defer COVID testing. Will provide tessalon refill and steroids given lack of improvement with mucinex and now >1 week though explained post-infectious cough can last weeks. Also with h/o GERD, may be contributing though no worsening in GERD symptoms. Obtain CXR given point tenderness in R sided ribs and worse with cough. F/u prn.   Myles Gip, DO

## 2022-01-14 ENCOUNTER — Encounter: Payer: Self-pay | Admitting: Family Medicine

## 2022-01-18 ENCOUNTER — Encounter: Payer: Self-pay | Admitting: *Deleted

## 2022-01-18 DIAGNOSIS — J439 Emphysema, unspecified: Secondary | ICD-10-CM | POA: Insufficient documentation

## 2022-01-18 MED ORDER — ALBUTEROL SULFATE HFA 108 (90 BASE) MCG/ACT IN AERS: 2.0000 | INHALATION_SPRAY | Freq: Four times a day (QID) | RESPIRATORY_TRACT | 2 refills | Status: DC | PRN

## 2022-01-18 NOTE — Addendum Note (Signed)
Addended by: Myles Gip on: 01/18/2022 10:19 AM   Modules accepted: Orders

## 2022-01-25 ENCOUNTER — Encounter (INDEPENDENT_AMBULATORY_CARE_PROVIDER_SITE_OTHER): Payer: Self-pay

## 2022-02-09 DIAGNOSIS — I442 Atrioventricular block, complete: Secondary | ICD-10-CM | POA: Diagnosis not present

## 2022-02-10 DIAGNOSIS — R001 Bradycardia, unspecified: Secondary | ICD-10-CM | POA: Diagnosis not present

## 2022-02-10 DIAGNOSIS — I255 Ischemic cardiomyopathy: Secondary | ICD-10-CM | POA: Diagnosis not present

## 2022-02-10 DIAGNOSIS — I739 Peripheral vascular disease, unspecified: Secondary | ICD-10-CM | POA: Diagnosis not present

## 2022-02-10 DIAGNOSIS — E8582 Wild-type transthyretin-related (ATTR) amyloidosis: Secondary | ICD-10-CM | POA: Diagnosis not present

## 2022-02-10 DIAGNOSIS — I480 Paroxysmal atrial fibrillation: Secondary | ICD-10-CM | POA: Diagnosis not present

## 2022-02-10 DIAGNOSIS — I5032 Chronic diastolic (congestive) heart failure: Secondary | ICD-10-CM | POA: Diagnosis not present

## 2022-02-10 DIAGNOSIS — I517 Cardiomegaly: Secondary | ICD-10-CM | POA: Diagnosis not present

## 2022-02-10 DIAGNOSIS — G5603 Carpal tunnel syndrome, bilateral upper limbs: Secondary | ICD-10-CM | POA: Diagnosis not present

## 2022-02-22 DIAGNOSIS — G608 Other hereditary and idiopathic neuropathies: Secondary | ICD-10-CM | POA: Diagnosis not present

## 2022-02-22 DIAGNOSIS — G6289 Other specified polyneuropathies: Secondary | ICD-10-CM | POA: Diagnosis not present

## 2022-02-22 DIAGNOSIS — G629 Polyneuropathy, unspecified: Secondary | ICD-10-CM | POA: Diagnosis not present

## 2022-02-22 DIAGNOSIS — Z7901 Long term (current) use of anticoagulants: Secondary | ICD-10-CM | POA: Diagnosis not present

## 2022-02-22 DIAGNOSIS — I5032 Chronic diastolic (congestive) heart failure: Secondary | ICD-10-CM | POA: Diagnosis not present

## 2022-02-22 DIAGNOSIS — I110 Hypertensive heart disease with heart failure: Secondary | ICD-10-CM | POA: Diagnosis not present

## 2022-02-22 DIAGNOSIS — R2689 Other abnormalities of gait and mobility: Secondary | ICD-10-CM | POA: Diagnosis not present

## 2022-02-22 DIAGNOSIS — Z79899 Other long term (current) drug therapy: Secondary | ICD-10-CM | POA: Diagnosis not present

## 2022-02-22 DIAGNOSIS — G5603 Carpal tunnel syndrome, bilateral upper limbs: Secondary | ICD-10-CM | POA: Diagnosis not present

## 2022-02-22 DIAGNOSIS — E8589 Other amyloidosis: Secondary | ICD-10-CM | POA: Diagnosis not present

## 2022-02-22 DIAGNOSIS — Z87891 Personal history of nicotine dependence: Secondary | ICD-10-CM | POA: Diagnosis not present

## 2022-03-02 ENCOUNTER — Telehealth: Payer: Self-pay | Admitting: Family Medicine

## 2022-03-02 NOTE — Telephone Encounter (Signed)
Medication not on current med list.

## 2022-03-02 NOTE — Telephone Encounter (Signed)
[facility] faxed refill request for the following medications:  donepezil (ARICEPT) 5 MG tablet    Please advise.

## 2022-03-04 ENCOUNTER — Other Ambulatory Visit: Payer: Self-pay | Admitting: Family Medicine

## 2022-03-04 MED ORDER — DONEPEZIL HCL 5 MG PO TBDP: 5.0000 mg | ORAL_TABLET | Freq: Every day | ORAL | 3 refills | Status: DC

## 2022-03-25 DIAGNOSIS — C44722 Squamous cell carcinoma of skin of right lower limb, including hip: Secondary | ICD-10-CM | POA: Diagnosis not present

## 2022-03-25 DIAGNOSIS — X32XXXA Exposure to sunlight, initial encounter: Secondary | ICD-10-CM | POA: Diagnosis not present

## 2022-03-25 DIAGNOSIS — Z85828 Personal history of other malignant neoplasm of skin: Secondary | ICD-10-CM | POA: Diagnosis not present

## 2022-03-25 DIAGNOSIS — L570 Actinic keratosis: Secondary | ICD-10-CM | POA: Diagnosis not present

## 2022-03-25 DIAGNOSIS — D485 Neoplasm of uncertain behavior of skin: Secondary | ICD-10-CM | POA: Diagnosis not present

## 2022-03-25 DIAGNOSIS — L814 Other melanin hyperpigmentation: Secondary | ICD-10-CM | POA: Diagnosis not present

## 2022-03-25 DIAGNOSIS — L578 Other skin changes due to chronic exposure to nonionizing radiation: Secondary | ICD-10-CM | POA: Diagnosis not present

## 2022-04-23 DIAGNOSIS — E782 Mixed hyperlipidemia: Secondary | ICD-10-CM | POA: Diagnosis not present

## 2022-04-23 DIAGNOSIS — R001 Bradycardia, unspecified: Secondary | ICD-10-CM | POA: Diagnosis not present

## 2022-04-23 DIAGNOSIS — I214 Non-ST elevation (NSTEMI) myocardial infarction: Secondary | ICD-10-CM | POA: Diagnosis not present

## 2022-04-23 DIAGNOSIS — I442 Atrioventricular block, complete: Secondary | ICD-10-CM | POA: Diagnosis not present

## 2022-04-23 DIAGNOSIS — I480 Paroxysmal atrial fibrillation: Secondary | ICD-10-CM | POA: Diagnosis not present

## 2022-04-23 DIAGNOSIS — I739 Peripheral vascular disease, unspecified: Secondary | ICD-10-CM | POA: Diagnosis not present

## 2022-04-23 DIAGNOSIS — I5032 Chronic diastolic (congestive) heart failure: Secondary | ICD-10-CM | POA: Diagnosis not present

## 2022-04-23 DIAGNOSIS — I255 Ischemic cardiomyopathy: Secondary | ICD-10-CM | POA: Diagnosis not present

## 2022-04-23 DIAGNOSIS — I517 Cardiomegaly: Secondary | ICD-10-CM | POA: Diagnosis not present

## 2022-04-28 ENCOUNTER — Other Ambulatory Visit: Payer: Self-pay | Admitting: Family Medicine

## 2022-04-28 DIAGNOSIS — F419 Anxiety disorder, unspecified: Secondary | ICD-10-CM

## 2022-05-03 DIAGNOSIS — H524 Presbyopia: Secondary | ICD-10-CM | POA: Diagnosis not present

## 2022-05-04 DIAGNOSIS — C44722 Squamous cell carcinoma of skin of right lower limb, including hip: Secondary | ICD-10-CM | POA: Diagnosis not present

## 2022-05-05 ENCOUNTER — Other Ambulatory Visit: Payer: Self-pay | Admitting: Family Medicine

## 2022-05-05 DIAGNOSIS — F419 Anxiety disorder, unspecified: Secondary | ICD-10-CM

## 2022-05-12 DIAGNOSIS — I350 Nonrheumatic aortic (valve) stenosis: Secondary | ICD-10-CM | POA: Diagnosis not present

## 2022-05-12 DIAGNOSIS — I214 Non-ST elevation (NSTEMI) myocardial infarction: Secondary | ICD-10-CM | POA: Diagnosis not present

## 2022-05-12 DIAGNOSIS — I480 Paroxysmal atrial fibrillation: Secondary | ICD-10-CM | POA: Diagnosis not present

## 2022-05-12 DIAGNOSIS — I4589 Other specified conduction disorders: Secondary | ICD-10-CM | POA: Diagnosis not present

## 2022-05-12 DIAGNOSIS — I5032 Chronic diastolic (congestive) heart failure: Secondary | ICD-10-CM | POA: Diagnosis not present

## 2022-05-12 DIAGNOSIS — E782 Mixed hyperlipidemia: Secondary | ICD-10-CM | POA: Diagnosis not present

## 2022-05-12 DIAGNOSIS — E8582 Wild-type transthyretin-related (ATTR) amyloidosis: Secondary | ICD-10-CM | POA: Diagnosis not present

## 2022-05-12 DIAGNOSIS — I739 Peripheral vascular disease, unspecified: Secondary | ICD-10-CM | POA: Diagnosis not present

## 2022-05-12 DIAGNOSIS — I442 Atrioventricular block, complete: Secondary | ICD-10-CM | POA: Diagnosis not present

## 2022-05-19 ENCOUNTER — Telehealth: Payer: Self-pay | Admitting: Family Medicine

## 2022-05-19 NOTE — Telephone Encounter (Signed)
Contacted Veverly Fells Hohmann to schedule their annual wellness visit. Appointment made for 07/05/2022.  [place] Direct Dial: [phone]

## 2022-05-21 ENCOUNTER — Other Ambulatory Visit: Payer: Self-pay | Admitting: Family Medicine

## 2022-05-21 DIAGNOSIS — F419 Anxiety disorder, unspecified: Secondary | ICD-10-CM

## 2022-06-24 ENCOUNTER — Other Ambulatory Visit: Payer: Self-pay | Admitting: Family Medicine

## 2022-06-24 DIAGNOSIS — I252 Old myocardial infarction: Secondary | ICD-10-CM

## 2022-07-05 ENCOUNTER — Ambulatory Visit (INDEPENDENT_AMBULATORY_CARE_PROVIDER_SITE_OTHER): Payer: Medicare HMO

## 2022-07-05 VITALS — Ht 69.0 in | Wt 195.0 lb

## 2022-07-05 DIAGNOSIS — Z0000 Encounter for general adult medical examination without abnormal findings: Secondary | ICD-10-CM | POA: Diagnosis not present

## 2022-07-05 NOTE — Progress Notes (Signed)
I connected with  Justin Warren on 07/05/22 by a audio enabled telemedicine application and verified that I am speaking with the correct person using two identifiers.  Patient Location: Home  Provider Location: Office/Clinic  I discussed the limitations of evaluation and management by telemedicine. The patient expressed understanding and agreed to proceed.  Subjective:   Justin Warren is a 77 y.o. male who presents for Medicare Annual/Subsequent preventive examination.  Review of Systems    Cardiac Risk Factors include: dyslipidemia;male gender;advanced age (>71men, >79 women);sedentary lifestyle;hypertension    Objective:    Today's Vitals   07/05/22 0814  Weight: 195 lb (88.5 kg)  Height: 5\' 9"  (1.753 m)   Body mass index is 28.8 kg/m.     07/05/2022    8:27 AM 06/29/2021    8:36 AM 02/26/2021    2:47 PM 07/09/2020    8:40 AM 06/23/2020    8:31 AM 02/06/2020    6:08 AM 02/01/2020    1:27 PM  Advanced Directives  Does Patient Have a Medical Advance Directive? Yes No No No Yes Yes Yes  Type of Merchandiser, retail of [place];Living will Healthcare Power of [place];Living will Healthcare Power of [place];Living will  Does patient want to make changes to medical advance directive?      No - Patient declined No - Patient declined  Copy of Healthcare Power of Attorney in Chart?    No - copy requested No - copy requested No - copy requested No - copy requested  Would patient like information on creating a medical advance directive?  No - Patient declined No - Patient declined No - Patient declined       Current Medications (verified) Outpatient Encounter Medications as of 07/05/2022  Medication Sig   acetaminophen (TYLENOL) 650 MG CR tablet Take 650 mg by mouth every 8 (eight) hours as needed for pain.   albuterol (VENTOLIN HFA) 108 (90 Base) MCG/ACT inhaler Inhale 2 puffs into the lungs every 6 (six) hours as needed for wheezing or shortness of breath.    busPIRone (BUSPAR) 7.5 MG tablet Take 1 tablet (7.5 mg total) by mouth 2 (two) times daily.   dicyclomine (BENTYL) 20 MG tablet Take 1 tablet (20 mg total) by mouth 3 (three) times daily before meals.   donepezil (ARICEPT ODT) 5 MG disintegrating tablet Take 1 tablet (5 mg total) by mouth at bedtime.   ELIQUIS 5 MG TABS tablet Take 1 tablet (5 mg total) by mouth 2 (two) times daily.   FLUoxetine (PROZAC) 20 MG capsule Take 1 capsule (20 mg total) by mouth daily.   losartan (COZAAR) 50 MG tablet Take 1 tablet (50 mg total) by mouth daily.   metoprolol succinate (TOPROL-XL) 25 MG 24 hr tablet Take 1 tablet (25 mg total) by mouth daily.   omeprazole (PRILOSEC) 40 MG capsule Take 1 capsule (40 mg total) by mouth daily.   Tafamidis 61 MG CAPS Take by mouth.   atorvastatin (LIPITOR) 80 MG tablet Take 1 tablet (80 mg total) by mouth daily.   benzonatate (TESSALON) 200 MG capsule Take 1 capsule (200 mg total) by mouth 2 (two) times daily as needed for cough. (Patient not taking: Reported on 07/05/2022)   clopidogrel (PLAVIX) 75 MG tablet TAKE 1 TABLET EVERY DAY WITH BREAKFAST (Patient not taking: Reported on 07/05/2022)   dextromethorphan-guaiFENesin (MUCINEX DM) 30-600 MG 12hr tablet Take 1 tablet by mouth 2 (two) times daily as needed for cough. (Patient not  taking: Reported on 07/05/2022)   predniSONE (STERAPRED UNI-PAK 21 TAB) 10 MG (21) TBPK tablet Take per package directions. (Patient not taking: Reported on 07/05/2022)   No facility-administered encounter medications on file as of 07/05/2022.    Allergies (verified) Amoxicillin-pot clavulanate   History: Past Medical History:  Diagnosis Date   Anxiety    Bradycardia    GERD (gastroesophageal reflux disease)    History of kidney stones    Hyperlipidemia    Hypertension    Kidney stone    Neuropathy    Past Surgical History:  Procedure Laterality Date   APPENDECTOMY  1994   BACK SURGERY     CARPAL TUNNEL RELEASE Right 2012   CARPAL TUNNEL  RELEASE Left 02/06/2020   Procedure: CARPAL TUNNEL RELEASE;  Surgeon: Deeann Saint, MD;  Location: [facility] ORS;  Service: Orthopedics;  Laterality: Left;  BLOCK   CATARACT EXTRACTION, BILATERAL  2019   COLONOSCOPY WITH PROPOFOL N/A 07/09/2020   Procedure: COLONOSCOPY WITH PROPOFOL;  Surgeon: Toney Reil, MD;  Location: [facility] ENDOSCOPY;  Service: Gastroenterology;  Laterality: N/A;   CORONARY STENT INTERVENTION N/A 02/27/2021   Procedure: CORONARY STENT INTERVENTION;  Surgeon: Alwyn Pea, MD;  Location: [facility] INVASIVE CV LAB;  Service: Cardiovascular;  Laterality: N/A;   CYSTOSCOPY W/ RETROGRADES Right 11/30/2019   Procedure: CYSTOSCOPY WITH RETROGRADE PYELOGRAM;  Surgeon: Sondra Come, MD;  Location: [facility] ORS;  Service: Urology;  Laterality: Right;   CYSTOSCOPY/URETEROSCOPY/HOLMIUM LASER/STENT PLACEMENT Right 11/30/2019   Procedure: CYSTOSCOPY/URETEROSCOPY/HOLMIUM LASER/STENT PLACEMENT;  Surgeon: Sondra Come, MD;  Location: [facility] ORS;  Service: Urology;  Laterality: Right;   ESOPHAGOGASTRODUODENOSCOPY (EGD) WITH PROPOFOL N/A 07/09/2020   Procedure: ESOPHAGOGASTRODUODENOSCOPY (EGD) WITH PROPOFOL;  Surgeon: Toney Reil, MD;  Location: [facility] ENDOSCOPY;  Service: Gastroenterology;  Laterality: N/A;   EYE SURGERY     INSERT / REPLACE / REMOVE PACEMAKER     JOINT REPLACEMENT     LEFT HEART CATH AND CORONARY ANGIOGRAPHY N/A 02/27/2021   Procedure: LEFT HEART CATH AND CORONARY ANGIOGRAPHY possible PCI and stent;  Surgeon: Alwyn Pea, MD;  Location: [facility] INVASIVE CV LAB;  Service: Cardiovascular;  Laterality: N/A;   LUMBAR LAMINECTOMIES  1976 & 1978   Dr. 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   SIGMOIDOSCOPY  2010   TRIGGER FINGER RELEASE Left 02/06/2020   Procedure: RELEASE TRIGGER  FINGER/A-1 PULLEY;  Surgeon: Deeann Saint, MD;  Location: [facility] ORS;  Service: Orthopedics;  Laterality: Left;   Family History  Problem Relation Age of Onset   Dementia Mother    GER disease Mother    Hypertension Father    Arrhythmia Sister    Heart murmur Sister    Social History   Socioeconomic History   Marital status: Married    Spouse name: Not on file   Number of children: 3   Years of education: Not on file   Highest education level: Bachelor's degree (e.g., BA, AB, BS)  Occupational History   Occupation: retired  Tobacco Use   Smoking status: Former    Packs/day: 2.00    Years: 30.00    Additional pack years: 0.00    Total pack years: 60.00    Types: Cigarettes   Smokeless tobacco: Never   Tobacco comments:    quit in 1991-1992  Vaping Use   Vaping Use:  Never used  Substance and Sexual Activity   Alcohol use: Not Currently    Alcohol/week: 0.0 standard drinks of alcohol    Comment: quit over 30 years ago   Drug use: No   Sexual activity: Yes    Birth control/protection: None  Other Topics Concern   Not on file  Social History Narrative   Not on file   Social Determinants of Health   Financial Resource Strain: Low Risk  (07/05/2022)   Overall Financial Resource Strain (CARDIA)    Difficulty of Paying Living Expenses: Not hard at all  Food Insecurity: No Food Insecurity (07/05/2022)   Hunger Vital Sign    Worried About Running Out of Food in the Last Year: Never true    Ran Out of Food in the Last Year: Never true  Transportation Needs: No Transportation Needs (07/05/2022)   PRAPARE - Administrator, Civil Service (Medical): No    Lack of Transportation (Non-Medical): No  Physical Activity: Insufficiently Active (07/05/2022)   Exercise Vital Sign    Days of Exercise per Week: 2 days    Minutes of Exercise per Session: 30 min  Stress: No Stress Concern Present (07/05/2022)   [employer] of Occupational Health - Occupational Stress  Questionnaire    Feeling of Stress : Not at all  Social Connections: Moderately Integrated (07/05/2022)   Social Connection and Isolation Panel [NHANES]    Frequency of Communication with Friends and Family: More than three times a week    Frequency of Social Gatherings with Friends and Family: Three times a week    Attends Religious Services: More than 4 times per year    Active Member of Clubs or Organizations: No    Attends Banker Meetings: Never    Marital Status: Married    Tobacco Counseling Counseling given: Not Answered Tobacco comments: quit in 1991-1992   Clinical Intake:  Pre-visit preparation completed: Yes  Pain : No/denies pain     BMI - recorded: 28.8 Nutritional Risks: Nausea/ vomitting/ diarrhea Diabetes: No  How often do you need to have someone help you when you read instructions, pamphlets, or other written materials from your doctor or pharmacy?: 1 - Never  Diabetic?no  Interpreter Needed?: No  Comments: lives with wife Information entered by :: B.Jamielynn Wigley,LPN   Activities of Daily Living    07/05/2022    8:27 AM  In your present state of health, do you have any difficulty performing the following activities:  Hearing? 1  Vision? 1  Comment readers only  Difficulty concentrating or making decisions? 0  Walking or climbing stairs? 1  Dressing or bathing? 0  Doing errands, shopping? 0  Preparing Food and eating ? N  Using the Toilet? N  In the past six months, have you accidently leaked urine? N  Do you have problems with loss of bowel control? Y  Comment since heart attack:loose or pasty stools  Managing your Medications? N  Managing your Finances? N  Housekeeping or managing your Housekeeping? N    Patient Care Team: Jacky Kindle, FNP as PCP - General (Family Medicine) Patty, A. Azucena Kuba, MD as Consulting Physician (Ophthalmology) Lady Gary Darlin Priestly, MD as Consulting Physician (Cardiology) Lyndle Herrlich, MD as Consulting  Physician (Orthopedic Surgery) Morene Crocker, MD as Referring Physician (Neurology)  Indicate any recent Medical Services you may have received from other than [facility] providers in the past year (date may be approximate).     Assessment:  This is a routine wellness examination for [place].  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      LIFESTYLE - DECREASE FALLS RISK   On track    Recommend to remove any items from the home that may cause slips or trips.        Depression Screen    07/05/2022    8:23 AM 07/07/2021    2:52 PM 06/29/2021    8:33 AM 06/11/2021    9:17 AM 04/30/2021   11:43 AM 06/23/2020    8:27 AM 05/08/2020    9:53 AM  PHQ 2/9 Scores  PHQ - 2 Score 0 0 1 4 4  0 0  PHQ- 9 Score  1 3 13 10   0    Fall Risk    07/05/2022    8:20 AM 06/29/2021    8:37 AM 04/17/2021    1:05 PM 06/23/2020    8:31 AM 05/08/2020    9:53 AM  Fall Risk   Falls in the past year? 1 1 0 1 1  Number falls in past yr: 1 1 0 1 1  Comment    accidental falls   Injury with Fall? 0 0  0 0  Risk for fall due to : History of fall(s) History of fall(s)  Impaired balance/gait History of fall(s)  Follow up Education provided;Falls prevention discussed Falls prevention discussed  Falls prevention discussed Falls prevention discussed;Education provided    FALL RISK PREVENTION PERTAINING TO THE HOME:  Any stairs in or around the home? Yes  If so,  are there any without handrails? Yes  Home free of loose throw rugs in walkways, pet beds, electrical cords, etc? Yes  Adequate lighting in your home to reduce risk of falls? Yes   ASSISTIVE DEVICES UTILIZED TO PREVENT FALLS:  Life alert? No  Use of a cane, walker or w/c? Yes cane Grab bars in the bathroom? Yes  Shower chair or bench in shower? Yes  Elevated toilet seat or a handicapped toilet? Yes   Cognitive Function:    05/21/2019   10:44 AM  MMSE - Mini Mental State Exam  Orientation to time 4  Orientation to Place 5  Registration 3  Attention/ Calculation 5  Recall 2  Language- name 2 objects 2  Language- repeat 1  Language- follow 3 step command 3  Language- read & follow direction 1  Write a sentence 1  Copy design 1  Total score 28        07/05/2022    8:32 AM 05/21/2019   10:15 AM 05/18/2018   10:28 AM 04/15/2017    8:47 AM  6CIT Screen  What Year? 0 points 0 points 0 points 0 points  What month? 0 points 0 points 0 points 0 points  What time? 0 points 0 points 0 points 0 points  Count back from 20 0 points 0 points 0 points 0  points  Months in reverse 0 points 0 points 0 points 0 points  Repeat phrase 0 points 0 points 0 points 0 points  Total Score 0 points 0 points 0 points 0 points    Immunizations Immunization History  Administered Date(s) Administered   Fluad Quad(high Dose 65+) 01/02/2020   Influenza Split 01/08/2010   Influenza, High Dose Seasonal PF 04/01/2014, 02/14/2018, 02/05/2019   Influenza,inj,Quad PF,6+ Mos 03/20/2013, 02/08/2021   Influenza-Unspecified 01/18/2015   PFIZER(Purple Top)SARS-COV-2 Vaccination 05/08/2019, 05/29/2019, 01/09/2020   Pneumococcal Conjugate-13 04/01/2014   Pneumococcal Polysaccharide-23 04/03/2015   Zoster, Live 03/01/2011    TDAP status: Up to date  Flu Vaccine status: Up to date  Pneumococcal vaccine status: Up to date  Covid-19 vaccine status: Declined, Education has been provided regarding the  importance of this vaccine but patient still declined. Advised may receive this vaccine at local pharmacy or Health Dept.or vaccine clinic. Aware to provide a copy of the vaccination record if obtained from local pharmacy or Health Dept. Verbalized acceptance and understanding.  Qualifies for Shingles Vaccine? Yes   Zostavax completed No   Shingrix Completed?: No.    Education has been provided regarding the importance of this vaccine. Patient has been advised to call insurance company to determine out of pocket expense if they have not yet received this vaccine. Advised may also receive vaccine at local pharmacy or Health Dept. Verbalized acceptance and understanding.  Screening Tests Health Maintenance  Topic Date Due   DTaP/Tdap/Td (1 - Tdap) Never done   Zoster Vaccines- Shingrix (1 of 2) Never done   Lung Cancer Screening  Never done   COVID-19 Vaccine (5 - 2023-24 season) 11/27/2021   INFLUENZA VACCINE  10/28/2022   Medicare Annual Wellness (AWV)  07/05/2023   COLONOSCOPY (Pts 45-24yrs Insurance coverage will need to be confirmed)  07/09/2025   Pneumonia Vaccine 22+ Years old  Completed   Hepatitis C Screening  Completed   HPV VACCINES  Aged Out    Health Maintenance  Health Maintenance Due  Topic Date Due   DTaP/Tdap/Td (1 - Tdap) Never done   Zoster Vaccines- Shingrix (1 of 2) Never done   Lung Cancer Screening  Never done   COVID-19 Vaccine (5 - 2023-24 season) 11/27/2021    Colorectal cancer screening: Type of screening: Colonoscopy. Completed yes. Repeat every 5 years aged out;last in 4/22  Lung Cancer Screening: (Low Dose CT Chest recommended if Age 53-80 years, 30 pack-year currently smoking OR have quit w/in 15years.) does not qualify.   Lung Cancer Screening Referral: no  Additional Screening:  Hepatitis C Screening: does not qualify; Completed yes  Vision Screening: Recommended annual ophthalmology exams for early detection of glaucoma and other disorders of  the eye. Is the patient up to date with their annual eye exam?  Yes  Who is the provider or what is the name of the office in which the patient attends annual eye exams? Dr Alexia Freestone If pt is not established with a provider, would they like to be referred to a provider to establish care? No .   Dental Screening: Recommended annual dental exams for proper oral hygiene  Community Resource Referral / Chronic Care Management: CRR required this visit?  No   CCM required this visit?  No      Plan:     I have personally reviewed and noted the following in the patient's chart:   Medical and social history Use of alcohol, tobacco or illicit drugs  Current medications and  supplements including opioid prescriptions. Patient is not currently taking opioid prescriptions. Functional ability and status Nutritional status Physical activity Advanced directives List of other physicians Hospitalizations, surgeries, and ER visits in previous 12 months Vitals Screenings to include cognitive, depression, and falls Referrals and appointments  In addition, I have reviewed and discussed with patient certain preventive protocols, quality metrics, and best practice recommendations. A written personalized care plan for preventive services as well as general preventive health recommendations were provided to patient.     Sue LushBrenda L Neville Walston, LPN   1/6/10964/10/2022   Nurse Notes: pt states he is doing good other than bothered by loose or pasty stools since heart surgery approx. 1.5years ago.  Pt has sustained a couple of falls due to neuropathy in feet (per pt). He relays no injuries or medical attention needed.

## 2022-07-05 NOTE — Patient Instructions (Signed)
Mr. Justin Warren , Thank you for taking time to come for your Medicare Wellness Visit. I appreciate your ongoing commitment to your health goals. Please review the following plan we discussed and let me know if I can assist you in the future.   These are the goals we discussed:  Goals      DIET - EAT MORE FRUITS AND VEGETABLES     DIET - INCREASE WATER INTAKE     Recommend increasing water intake to 4 glasses a day.      LIFESTYLE - DECREASE FALLS RISK     Recommend to remove any items from the home that may cause slips or trips.         This is a list of the screening recommended for you and due dates:  Health Maintenance  Topic Date Due   DTaP/Tdap/Td vaccine (1 - Tdap) Never done   Zoster (Shingles) Vaccine (1 of 2) Never done   Screening for Lung Cancer  Never done   COVID-19 Vaccine (5 - 2023-24 season) 11/27/2021   Flu Shot  10/28/2022   Medicare Annual Wellness Visit  07/05/2023   Colon Cancer Screening  07/09/2025   Pneumonia Vaccine  Completed   Hepatitis C Screening: USPSTF Recommendation to screen - Ages 18-79 yo.  Completed   HPV Vaccine  Aged Out    Advanced directives: yes  Conditions/risks identified: falls risk  Next appointment: Follow up in one year for your annual wellness visit. 07/06/2023 @8 :45am telephone  Preventive Care 65 Years and Older, Male  Preventive care refers to lifestyle choices and visits with your health care provider that can promote health and wellness. What does preventive care include? A yearly physical exam. This is also called an annual well check. Dental exams once or twice a year. Routine eye exams. Ask your health care provider how often you should have your eyes checked. Personal lifestyle choices, including: Daily care of your teeth and gums. Regular physical activity. Eating a healthy diet. Avoiding tobacco and drug use. Limiting alcohol use. Practicing safe sex. Taking low doses of aspirin every day. Taking vitamin and  mineral supplements as recommended by your health care provider. What happens during an annual well check? The services and screenings done by your health care provider during your annual well check will depend on your age, overall health, lifestyle risk factors, and family history of disease. Counseling  Your health care provider may ask you questions about your: Alcohol use. Tobacco use. Drug use. Emotional well-being. Home and relationship well-being. Sexual activity. Eating habits. History of falls. Memory and ability to understand (cognition). Work and work Astronomer. Screening  You may have the following tests or measurements: Height, weight, and BMI. Blood pressure. Lipid and cholesterol levels. These may be checked every 5 years, or more frequently if you are over 55 years old. Skin check. Lung cancer screening. You may have this screening every year starting at age 69 if you have a 30-pack-year history of smoking and currently smoke or have quit within the past 15 years. Fecal occult blood test (FOBT) of the stool. You may have this test every year starting at age 26. Flexible sigmoidoscopy or colonoscopy. You may have a sigmoidoscopy every 5 years or a colonoscopy every 10 years starting at age 6. Prostate cancer screening. Recommendations will vary depending on your family history and other risks. Hepatitis C blood test. Hepatitis B blood test. Sexually transmitted disease (STD) testing. Diabetes screening. This is done by checking your blood  sugar (glucose) after you have not eaten for a while (fasting). You may have this done every 1-3 years. Abdominal aortic aneurysm (AAA) screening. You may need this if you are a current or former smoker. Osteoporosis. You may be screened starting at age 29 if you are at high risk. Talk with your health care provider about your test results, treatment options, and if necessary, the need for more tests. Vaccines  Your health care  provider may recommend certain vaccines, such as: Influenza vaccine. This is recommended every year. Tetanus, diphtheria, and acellular pertussis (Tdap, Td) vaccine. You may need a Td booster every 10 years. Zoster vaccine. You may need this after age 23. Pneumococcal 13-valent conjugate (PCV13) vaccine. One dose is recommended after age 44. Pneumococcal polysaccharide (PPSV23) vaccine. One dose is recommended after age 45. Talk to your health care provider about which screenings and vaccines you need and how often you need them. This information is not intended to replace advice given to you by your health care provider. Make sure you discuss any questions you have with your health care provider. Document Released: 04/11/2015 Document Revised: 12/03/2015 Document Reviewed: 01/14/2015 Elsevier Interactive Patient Education  2017 [employer] Prevention in the Home Falls can cause injuries. They can happen to people of all ages. There are many things you can do to make your home safe and to help prevent falls. What can I do on the outside of my home? Regularly fix the edges of walkways and driveways and fix any cracks. Remove anything that might make you trip as you walk through a door, such as a raised step or threshold. Trim any bushes or trees on the path to your home. Use bright outdoor lighting. Clear any walking paths of anything that might make someone trip, such as rocks or tools. Regularly check to see if handrails are loose or broken. Make sure that both sides of any steps have handrails. Any raised decks and porches should have guardrails on the edges. Have any leaves, snow, or ice cleared regularly. Use sand or salt on walking paths during winter. Clean up any spills in your garage right away. This includes oil or grease spills. What can I do in the bathroom? Use night lights. Install grab bars by the toilet and in the tub and shower. Do not use towel bars as grab  bars. Use non-skid mats or decals in the tub or shower. If you need to sit down in the shower, use a plastic, non-slip stool. Keep the floor dry. Clean up any water that spills on the floor as soon as it happens. Remove soap buildup in the tub or shower regularly. Attach bath mats securely with double-sided non-slip rug tape. Do not have throw rugs and other things on the floor that can make you trip. What can I do in the bedroom? Use night lights. Make sure that you have a light by your bed that is easy to reach. Do not use any sheets or blankets that are too big for your bed. They should not hang down onto the floor. Have a firm chair that has side arms. You can use this for support while you get dressed. Do not have throw rugs and other things on the floor that can make you trip. What can I do in the kitchen? Clean up any spills right away. Avoid walking on wet floors. Keep items that you use a lot in easy-to-reach places. If you need to reach something above you,  use a strong step stool that has a grab bar. Keep electrical cords out of the way. Do not use floor polish or wax that makes floors slippery. If you must use wax, use non-skid floor wax. Do not have throw rugs and other things on the floor that can make you trip. What can I do with my stairs? Do not leave any items on the stairs. Make sure that there are handrails on both sides of the stairs and use them. Fix handrails that are broken or loose. Make sure that handrails are as long as the stairways. Check any carpeting to make sure that it is firmly attached to the stairs. Fix any carpet that is loose or worn. Avoid having throw rugs at the top or bottom of the stairs. If you do have throw rugs, attach them to the floor with carpet tape. Make sure that you have a light switch at the top of the stairs and the bottom of the stairs. If you do not have them, ask someone to add them for you. What else can I do to help prevent  falls? Wear shoes that: Do not have high heels. Have rubber bottoms. Are comfortable and fit you well. Are closed at the toe. Do not wear sandals. If you use a stepladder: Make sure that it is fully opened. Do not climb a closed stepladder. Make sure that both sides of the stepladder are locked into place. Ask someone to hold it for you, if possible. Clearly mark and make sure that you can see: Any grab bars or handrails. First and last steps. Where the edge of each step is. Use tools that help you move around (mobility aids) if they are needed. These include: Canes. Walkers. Scooters. Crutches. Turn on the lights when you go into a dark area. Replace any light bulbs as soon as they burn out. Set up your furniture so you have a clear path. Avoid moving your furniture around. If any of your floors are uneven, fix them. If there are any pets around you, be aware of where they are. Review your medicines with your doctor. Some medicines can make you feel dizzy. This can increase your chance of falling. Ask your doctor what other things that you can do to help prevent falls. This information is not intended to replace advice given to you by your health care provider. Make sure you discuss any questions you have with your health care provider. Document Released: 01/09/2009 Document Revised: 08/21/2015 Document Reviewed: 04/19/2014 Elsevier Interactive Patient Education  2017 [employer].

## 2022-07-26 DIAGNOSIS — Z0100 Encounter for examination of eyes and vision without abnormal findings: Secondary | ICD-10-CM | POA: Diagnosis not present

## 2022-08-10 DIAGNOSIS — L814 Other melanin hyperpigmentation: Secondary | ICD-10-CM | POA: Diagnosis not present

## 2022-08-10 DIAGNOSIS — Z08 Encounter for follow-up examination after completed treatment for malignant neoplasm: Secondary | ICD-10-CM | POA: Diagnosis not present

## 2022-08-10 DIAGNOSIS — D2272 Melanocytic nevi of left lower limb, including hip: Secondary | ICD-10-CM | POA: Diagnosis not present

## 2022-08-10 DIAGNOSIS — D2271 Melanocytic nevi of right lower limb, including hip: Secondary | ICD-10-CM | POA: Diagnosis not present

## 2022-08-10 DIAGNOSIS — L570 Actinic keratosis: Secondary | ICD-10-CM | POA: Diagnosis not present

## 2022-08-10 DIAGNOSIS — D2262 Melanocytic nevi of left upper limb, including shoulder: Secondary | ICD-10-CM | POA: Diagnosis not present

## 2022-08-10 DIAGNOSIS — D225 Melanocytic nevi of trunk: Secondary | ICD-10-CM | POA: Diagnosis not present

## 2022-08-10 DIAGNOSIS — L821 Other seborrheic keratosis: Secondary | ICD-10-CM | POA: Diagnosis not present

## 2022-08-10 DIAGNOSIS — D2261 Melanocytic nevi of right upper limb, including shoulder: Secondary | ICD-10-CM | POA: Diagnosis not present

## 2022-08-10 DIAGNOSIS — L578 Other skin changes due to chronic exposure to nonionizing radiation: Secondary | ICD-10-CM | POA: Diagnosis not present

## 2022-08-16 ENCOUNTER — Other Ambulatory Visit: Payer: Self-pay | Admitting: Family Medicine

## 2022-08-16 DIAGNOSIS — I10 Essential (primary) hypertension: Secondary | ICD-10-CM

## 2022-08-24 ENCOUNTER — Other Ambulatory Visit: Payer: Self-pay | Admitting: Family Medicine

## 2022-08-24 DIAGNOSIS — I252 Old myocardial infarction: Secondary | ICD-10-CM

## 2022-08-25 NOTE — Telephone Encounter (Signed)
Requested medication (s) are due for refill today: yes  Requested medication (s) are on the active medication list: yes  Last refill:  07/07/21 #90/3  Future visit scheduled: no  Notes to clinic:  Unable to refill per protocol due to failed labs, no updated results.     Requested Prescriptions  Pending Prescriptions Disp Refills   atorvastatin (LIPITOR) 80 MG tablet [Pharmacy Med Name: ATORVASTATIN CALCIUM 80 MG Tablet] 90 tablet 3    Sig: TAKE 1 TABLET EVERY DAY     Cardiovascular:  Antilipid - Statins Failed - 08/24/2022 10:57 AM      Failed - Lipid Panel in normal range within the last 12 months    Cholesterol, Total  Date Value Ref Range Status  11/03/2020 163 100 - 199 mg/dL Final   Cholesterol  Date Value Ref Range Status  02/27/2021 155 0 - 200 mg/dL Final   LDL Chol Calc (NIH)  Date Value Ref Range Status  11/03/2020 99 0 - 99 mg/dL Final   LDL Cholesterol  Date Value Ref Range Status  02/27/2021 83 0 - 99 mg/dL Final    Comment:           Total Cholesterol/HDL:CHD Risk Coronary Heart Disease Risk Table                     Men   Women  1/2 Average Risk   3.4   3.3  Average Risk       5.0   4.4  2 X Average Risk   9.6   7.1  3 X Average Risk  23.4   11.0        Use the calculated Patient Ratio above and the CHD Risk Table to determine the patient's CHD Risk.        ATP III CLASSIFICATION (LDL):  <100     mg/dL   Optimal  161-096  mg/dL   Near or Above                    Optimal  130-159  mg/dL   Borderline  045-409  mg/dL   High  >811     mg/dL   Very High Performed at [facility], [address] [place], [place] [address]    HDL  Date Value Ref Range Status  02/27/2021 45 >40 mg/dL Final  29/56/2130 35 (L) >39 mg/dL Final   Triglycerides  Date Value Ref Range Status  02/27/2021 135 <150 mg/dL Final         Passed - Patient is not pregnant      Passed - Valid encounter within last 12 months    Recent Outpatient Visits            7 months ago Rib pain   [facility] Caro Laroche, DO   1 year ago Annual physical exam   [facility] Merita Norton T, FNP   1 year ago Ulcerative colitis with rectal bleeding, unspecified location [facility])   [facility] Merita Norton T, FNP   1 year ago NSTEMI (non-ST elevated myocardial infarction) [facility])   [facility] Caro Laroche, DO   1 year ago NSTEMI (non-ST elevated myocardial infarction) [facility])   [facility] Caro Laroche, DO

## 2022-12-09 ENCOUNTER — Other Ambulatory Visit: Payer: Self-pay | Admitting: Family Medicine

## 2022-12-09 DIAGNOSIS — I10 Essential (primary) hypertension: Secondary | ICD-10-CM

## 2022-12-10 NOTE — Telephone Encounter (Signed)
Requested Prescriptions  Pending Prescriptions Disp Refills   losartan (COZAAR) 50 MG tablet [Pharmacy Med Name: Losartan Potassium Oral Tablet 50 MG] 90 tablet 0    Sig: TAKE 1 TABLET EVERY DAY (NEED MD APPOINTMENT)     Cardiovascular:  Angiotensin Receptor Blockers Failed - 12/09/2022  3:18 PM      Failed - Cr in normal range and within 180 days    Creat  Date Value Ref Range Status  12/16/2016 1.02 0.70 - 1.18 mg/dL Final    Comment:    For patients >53 years of age, the reference limit for Creatinine is approximately 13% higher for people identified as African-American. .    Creatinine, Ser  Date Value Ref Range Status  07/07/2021 0.87 0.76 - 1.27 mg/dL Final         Failed - K in normal range and within 180 days    Potassium  Date Value Ref Range Status  07/07/2021 4.2 3.5 - 5.2 mmol/L Final         Passed - Patient is not pregnant      Passed - Last BP in normal range    BP Readings from Last 1 Encounters:  01/13/22 129/77         Passed - Valid encounter within last 6 months    Recent Outpatient Visits           11 months ago Rib pain   [facility] Caro Laroche, DO   1 year ago Annual physical exam   [facility] Merita Norton T, FNP   1 year ago Ulcerative colitis with rectal bleeding, unspecified location [facility])   [facility] Merita Norton T, FNP   1 year ago NSTEMI (non-ST elevated myocardial infarction) [facility])   [facility] Caro Laroche, DO   1 year ago NSTEMI (non-ST elevated myocardial infarction) [facility])   [facility] Caro Laroche, DO

## 2022-12-22 ENCOUNTER — Other Ambulatory Visit: Payer: Self-pay | Admitting: Family Medicine

## 2023-02-01 ENCOUNTER — Encounter: Payer: Self-pay | Admitting: Family Medicine

## 2023-02-01 ENCOUNTER — Ambulatory Visit: Payer: Self-pay | Admitting: *Deleted

## 2023-02-01 ENCOUNTER — Other Ambulatory Visit: Payer: Self-pay | Admitting: Family Medicine

## 2023-02-01 DIAGNOSIS — F419 Anxiety disorder, unspecified: Secondary | ICD-10-CM

## 2023-02-01 DIAGNOSIS — I252 Old myocardial infarction: Secondary | ICD-10-CM

## 2023-02-01 DIAGNOSIS — I10 Essential (primary) hypertension: Secondary | ICD-10-CM

## 2023-02-01 NOTE — Telephone Encounter (Signed)
Medication Refill - Medication:  busPIRone (BUSPAR) 7.5 MG tablet  *completely out     clopidogrel (PLAVIX) 75 MG tablet   donepezil (ARICEPT ODT) 5 MG disintegrating tablet   atorvastatin (LIPITOR) 80 MG tablet   losartan (COZAAR) 50 MG tablet   *Patient is not out of the above medications but states that he needs ALL of these medications sent to pharmacy listed below, [facility]   Has the patient contacted their pharmacy? No.  Preferred Pharmacy (with phone number or street name):   [facility] - [place], [place] - [address] Phone: [phone]  Fax: [phone]       Has the patient been seen for an appointment in the last year OR does the patient have an upcoming appointment? Yes

## 2023-02-01 NOTE — Telephone Encounter (Signed)
Summary: medication clarity + medication question   Patient's daughter, Justin Warren, called and states that patients wife passed away a few weeks back and patient's wife did everything for patient and now they are trying to get medication straightened out for the patient. Patients daughter states they need to know if patient is supposed to be taking busPIRone (not sure the MG) ? And if so, he needs a refill on this medication sent too. [facility] [address], [place] - [address] [place] [address] Phone: [phone] Fax: [phone] Hours: Not open 24 hours   Also Justin Warren was saying that there are several other medications that patient is on that they are trying to get sent through the [place] (including busPIRone if he is supposed to be taking this) and wants to know what the process for that would be?  Please contact Justin Warren back @ # [phone]

## 2023-02-01 NOTE — Telephone Encounter (Signed)
  Chief Complaint: Medication question- Buspirone Symptoms: patient may not be getting medication needed- recent loss of wife and she handled patient medication. Patient's family is trying to figure out what patient is currently taking- he has multiple bottles  Disposition: [] ED /[] Urgent Care (no appt availability in office) / [] Appointment(In office/virtual)/ []  [place] [facility]/ [] Home Care/ [] Refused Recommended Disposition /[] [place] [facility]/ [x]  Follow-up with PCP Additional Notes: Advised no DPR- need to have this filled out- or have patient call office for medication RF. Please have patient call for appointment. Daughter was very understanding and she will notifiy her father.  Reason for Disposition . Caller has medicine question only, adult not sick, AND triager answers question  Answer Assessment - Initial Assessment Questions 1. NAME of MEDICINE: "What medicine(s) are you calling about?"     Buspirone 2. QUESTION: "What is your question?" (e.g., double dose of medicine, side effect)     Is patient still taking this medication? 3. PRESCRIBER: "Who prescribed the medicine?" Reason: if prescribed by specialist, call should be referred to that group.     PCP Patient has recent lost wife and patient's daughter is trying to help patient with his medications. Patient has many bottles and they are not sure what he is supposed to be taking.  Advised daughter about DPR and need for this in order to verify information she is requesting- at this point- advised her to request the refill from the provider listed on the medication. They will come to office with patient next week to sign DPR and make appointment.  Protocols used: Medication Question Call-A-AH

## 2023-02-02 NOTE — Telephone Encounter (Signed)
Requested medication (s) are due for refill today:   Provider to review  Requested medication (s) are on the active medication list:   Yes for all 5  Future visit scheduled:   No   LOV was with Dr Linwood Dibbles April of 2023.    Looks like he is getting his care at the [place].   Last ordered: Varied dates.  See messages from 11/5 regarding these refills for details about these medications.   Daughter needs to put her name on his DPR.   Robynn Pane mentioned he needs an appt. In her note.  Daughter to sign DPR at the appt.      Requested Prescriptions  Pending Prescriptions Disp Refills   busPIRone (BUSPAR) 7.5 MG tablet 180 tablet 3    Sig: Take 1 tablet (7.5 mg total) by mouth 2 (two) times daily.     Psychiatry: Anxiolytics/Hypnotics - Non-controlled Passed - 02/01/2023  4:19 PM      Passed - Valid encounter within last 12 months    Recent Outpatient Visits           1 year ago Rib pain   [facility] Caro Laroche, DO   1 year ago Annual physical exam   [place] [facility] [facility] Merita Norton T, FNP   1 year ago Ulcerative colitis with rectal bleeding, unspecified location [facility])   [place] [facility] T, FNP   1 year ago NSTEMI (non-ST elevated myocardial infarction) [facility])   [place] [facility]o [facility], DO   1 year ago NSTEMI (non-ST elevated myocardial infarction) [facility])   [place] [facility] [place] Dense M, DO               clopidogrel (PLAVIX) 75 MG tablet 90 tablet 3     Hematology: Antiplatelets - clopidogrel Failed - 02/01/2023  4:19 PM      Failed - HCT in normal range and within 180 days    Hematocrit  Date Value Ref Range Status  07/07/2021 44.4 37.5 - 51.0 % Final         Failed - HGB in normal range and within 180 days    Hemoglobin  Date Value Ref Range Status  07/07/2021 15.1 13.0 - 17.7 g/dL Final         Failed - PLT in normal range and  within 180 days    Platelets  Date Value Ref Range Status  07/07/2021 274 150 - 450 x10E3/uL Final         Failed - Cr in normal range and within 360 days    Creat  Date Value Ref Range Status  12/16/2016 1.02 0.70 - 1.18 mg/dL Final    Comment:    For patients >71 years of age, the reference limit for Creatinine is approximately 13% higher for people identified as African-American. .    Creatinine, Ser  Date Value Ref Range Status  07/07/2021 0.87 0.76 - 1.27 mg/dL Final         Failed - Valid encounter within last 6 months    Recent Outpatient Visits           1 year ago Rib pain   [facility]o [facility], DO   1 year ago Annual physical exam   [facility] Merita Norton T, FNP   1 year ago Ulcerative colitis with rectal bleeding, unspecified location [facility])   [facility] Norton T, FNP   1 year ago NSTEMI (non-ST elevated myocardial infarction) [facility])   [place] [facility], DO   1 year ago NSTEMI (non-ST elevated myocardial infarction) [facility])   [place] [facility] Ell[place] Dense M, DO               donepezil (ARICEPT ODT) 5 MG disintegrating tablet 90 tablet 3    Sig: Take 1 tablet (5 mg total) by mouth at bedtime.     Neurology:  Alzheimer's Agents Failed - 02/01/2023  4:19 PM      Failed - Valid encounter within last 6 months    Recent Outpatient Visits           1 year ago Rib pain   [place] [facility] Car[facility], DO   1 year ago Annual physical exam   [facility], FNP   1 year ago Ulcerative colitis with rectal bleeding, unspecified location [facility])   [facility] Norton T, FNP   1 year ago NSTEMI (non-ST elevated myocardial infarction) [facility])   [place] [facility] Caro Laroche, DO   1 year  ago NSTEMI (non-ST elevated myocardial infarction) [facility])   [place] [place] Dense M, DO               atorvastatin (LIPITOR) 80 MG tablet 90 tablet 3    Sig: Take 1 tablet (80 mg total) by mouth daily.     Cardiovascular:  Antilipid - Statins Failed - 02/01/2023  4:19 PM      Failed - Lipid Panel in normal range within the last 12 months    Cholesterol, Total  Date Value Ref Range Status  11/03/2020 163 100 - 199 mg/dL Final   Cholesterol  Date Value Ref Range Status  02/27/2021 155 0 - 200 mg/dL Final   LDL Chol Calc (NIH)  Date Value Ref Range Status  11/03/2020 99 0 - 99 mg/dL Final   LDL Cholesterol  Date Value Ref Range Status  02/27/2021 83 0 - 99 mg/dL Final    Comment:           Total Cholesterol/HDL:CHD Risk Coronary Heart Disease Risk Table                     Men   Women  1/2 Average Risk   3.4   3.3  Average Risk       5.0   4.4  2 X Average Risk   9.6   7.1  3 X Average Risk  23.4   11.0        Use the calculated Patient Ratio above and the CHD Risk Table to determine the patient's CHD Risk.        ATP III CLASSIFICATION (LDL):  <100     mg/dL   Optimal  166-063  mg/dL   Near or Above                    Optimal  130-159  mg/dL   Borderline  016-010  mg/dL   High  >932     mg/dL   Very High Performed at [facility], [address] [place], [place] [address]    HDL  Date Value Ref Range Status  02/27/2021 45 >40 mg/dL Final  22/04/5425 35 (L) >39  mg/dL Final   Triglycerides  Date Value Ref Range Status  02/27/2021 135 <150 mg/dL Final         Passed - Patient is not pregnant      Passed - Valid encounter within last 12 months    Recent Outpatient Visits           1 year ago Rib pain   [place] [facility], DO   1 year ago Annual physical exam   [place] [facility] Merita Norton T, FNP   1 year ago Ulcerative colitis with rectal  bleeding, unspecified location [facility])   [facility] T, FNP   1 year ago NSTEMI (non-ST elevated myocardial infarction) [facility])   [place] [facility] Caro Laroche, DO   1 year ago NSTEMI (non-ST elevated myocardial infarction) [facility])    [facility] [place] Dense M, DO               losartan (COZAAR) 50 MG tablet 90 tablet 0     Cardiovascular:  Angiotensin Receptor Blockers Failed - 02/01/2023  4:19 PM      Failed - Cr in normal range and within 180 days    Creat  Date Value Ref Range Status  12/16/2016 1.02 0.70 - 1.18 mg/dL Final    Comment:    For patients >41 years of age, the reference limit for Creatinine is approximately 13% higher for people identified as African-American. .    Creatinine, Ser  Date Value Ref Range Status  07/07/2021 0.87 0.76 - 1.27 mg/dL Final         Failed - K in normal range and within 180 days    Potassium  Date Value Ref Range Status  07/07/2021 4.2 3.5 - 5.2 mmol/L Final         Failed - Valid encounter within last 6 months    Recent Outpatient Visits           1 year ago Rib pain   [facility], DO   1 year ago Annual physical exam   [facility] Mer[facility] Norton T, FNP   1 year ago Ulcerative colitis with rectal bleeding, unspecified location [facility])    [facility] Mer[facility] Norton T, FNP   1 year ago NSTEMI (non-ST elevated myocardial infarction) [facility])    [name] Car[facility], DO   1 year ago NSTEMI (non-ST elevated myocardial infarction) [facility])   [facility] Caro Laroche, [place]              Passed - Patient is not pregnant      Passed - Last BP in normal range    BP Readings from Last 1 Encounters:  01/13/22 129/77

## 2023-03-22 ENCOUNTER — Other Ambulatory Visit: Payer: Self-pay | Admitting: Family Medicine

## 2023-03-22 DIAGNOSIS — I10 Essential (primary) hypertension: Secondary | ICD-10-CM

## 2023-03-22 NOTE — Telephone Encounter (Signed)
Requested medication (s) are due for refill today: Yes  Requested medication (s) are on the active medication list: Yes  Last refill:  07/08/22  Future visit scheduled: No  Notes to clinic:  Unable to refill per protocol due to failed labs, no updated results, appointment needed     Requested Prescriptions  Pending Prescriptions Disp Refills   losartan (COZAAR) 50 MG tablet [Pharmacy Med Name: Losartan Potassium Oral Tablet 50 MG] 90 tablet 3    Sig: TAKE 1 TABLET EVERY DAY (NEED MD APPOINTMENT)     Cardiovascular:  Angiotensin Receptor Blockers Failed - 03/22/2023  3:53 PM      Failed - Cr in normal range and within 180 days    Creat  Date Value Ref Range Status  12/16/2016 1.02 0.70 - 1.18 mg/dL Final    Comment:    For patients >67 years of age, the reference limit for Creatinine is approximately 13% higher for people identified as African-American. .    Creatinine, Ser  Date Value Ref Range Status  07/07/2021 0.87 0.76 - 1.27 mg/dL Final         Failed - K in normal range and within 180 days    Potassium  Date Value Ref Range Status  07/07/2021 4.2 3.5 - 5.2 mmol/L Final         Failed - Valid encounter within last 6 months    Recent Outpatient Visits           1 year ago Rib pain   [facility] Caro [facility], DO   1 year ago Annual physical exam   [facility] Mer[facility] Norton T, FNP   1 year ago Ulcerative colitis with rectal bleeding, unspecified location [facility])   [facility] T, FNP   1 year ago NSTEMI (non-ST elevated myocardial infarction) [facility])   [facility] Car[facility] Lar[facility], DO   2 years ago NSTEMI (non-ST elevated myocardial infarction) [facility])   [facility] Caro Laroche, [place]              Passed - Patient is not pregnant      Passed - Last BP in normal range    BP Readings from Last 1 Encounters:   01/13/22 129/77

## 2023-04-01 ENCOUNTER — Ambulatory Visit: Payer: Medicare HMO | Admitting: Family Medicine

## 2023-04-08 DIAGNOSIS — I2510 Atherosclerotic heart disease of native coronary artery without angina pectoris: Secondary | ICD-10-CM | POA: Diagnosis not present

## 2023-04-08 DIAGNOSIS — Z950 Presence of cardiac pacemaker: Secondary | ICD-10-CM | POA: Diagnosis not present

## 2023-04-08 DIAGNOSIS — I480 Paroxysmal atrial fibrillation: Secondary | ICD-10-CM | POA: Diagnosis not present

## 2023-04-08 DIAGNOSIS — I739 Peripheral vascular disease, unspecified: Secondary | ICD-10-CM | POA: Diagnosis not present

## 2023-04-08 DIAGNOSIS — I5032 Chronic diastolic (congestive) heart failure: Secondary | ICD-10-CM | POA: Diagnosis not present

## 2023-04-08 DIAGNOSIS — I4589 Other specified conduction disorders: Secondary | ICD-10-CM | POA: Diagnosis not present

## 2023-04-08 DIAGNOSIS — E8582 Wild-type transthyretin-related (ATTR) amyloidosis: Secondary | ICD-10-CM | POA: Diagnosis not present

## 2023-04-08 DIAGNOSIS — R001 Bradycardia, unspecified: Secondary | ICD-10-CM | POA: Diagnosis not present

## 2023-04-08 DIAGNOSIS — I442 Atrioventricular block, complete: Secondary | ICD-10-CM | POA: Diagnosis not present

## 2023-04-15 ENCOUNTER — Telehealth: Payer: Self-pay | Admitting: Family Medicine

## 2023-04-15 NOTE — Telephone Encounter (Signed)
[facility] faxed refill request for the following medications:    clopidogrel (PLAVIX) 75 MG tablet    Please advise

## 2023-04-15 NOTE — Telephone Encounter (Signed)
Not due for refill at this time.

## 2023-04-20 ENCOUNTER — Encounter: Payer: Self-pay | Admitting: Family Medicine

## 2023-04-20 ENCOUNTER — Ambulatory Visit (INDEPENDENT_AMBULATORY_CARE_PROVIDER_SITE_OTHER): Payer: Medicare HMO | Admitting: Family Medicine

## 2023-04-20 VITALS — BP 138/73 | HR 67 | Resp 20 | Ht 69.0 in | Wt 172.1 lb

## 2023-04-20 DIAGNOSIS — E8582 Wild-type transthyretin-related (ATTR) amyloidosis: Secondary | ICD-10-CM | POA: Diagnosis not present

## 2023-04-20 DIAGNOSIS — I10 Essential (primary) hypertension: Secondary | ICD-10-CM

## 2023-04-20 DIAGNOSIS — F4329 Adjustment disorder with other symptoms: Secondary | ICD-10-CM

## 2023-04-20 DIAGNOSIS — I252 Old myocardial infarction: Secondary | ICD-10-CM | POA: Diagnosis not present

## 2023-04-20 DIAGNOSIS — M21371 Foot drop, right foot: Secondary | ICD-10-CM | POA: Diagnosis not present

## 2023-04-20 DIAGNOSIS — I480 Paroxysmal atrial fibrillation: Secondary | ICD-10-CM

## 2023-04-20 DIAGNOSIS — I5032 Chronic diastolic (congestive) heart failure: Secondary | ICD-10-CM | POA: Diagnosis not present

## 2023-04-20 DIAGNOSIS — J439 Emphysema, unspecified: Secondary | ICD-10-CM

## 2023-04-20 DIAGNOSIS — I739 Peripheral vascular disease, unspecified: Secondary | ICD-10-CM | POA: Diagnosis not present

## 2023-04-20 DIAGNOSIS — G3184 Mild cognitive impairment, so stated: Secondary | ICD-10-CM

## 2023-04-20 DIAGNOSIS — R200 Anesthesia of skin: Secondary | ICD-10-CM

## 2023-04-20 DIAGNOSIS — Z95811 Presence of heart assist device: Secondary | ICD-10-CM

## 2023-04-20 DIAGNOSIS — F419 Anxiety disorder, unspecified: Secondary | ICD-10-CM

## 2023-04-20 DIAGNOSIS — K589 Irritable bowel syndrome without diarrhea: Secondary | ICD-10-CM

## 2023-04-20 MED ORDER — ELIQUIS 5 MG PO TABS: 5.0000 mg | ORAL_TABLET | Freq: Two times a day (BID) | ORAL | 3 refills | Status: DC

## 2023-04-20 MED ORDER — DONEPEZIL HCL 5 MG PO TBDP: 5.0000 mg | ORAL_TABLET | Freq: Every day | ORAL | 3 refills | Status: AC

## 2023-04-20 MED ORDER — ATORVASTATIN CALCIUM 80 MG PO TABS: 80.0000 mg | ORAL_TABLET | Freq: Every day | ORAL | 3 refills | Status: AC

## 2023-04-20 MED ORDER — METOPROLOL SUCCINATE ER 25 MG PO TB24: 25.0000 mg | ORAL_TABLET | Freq: Every day | ORAL | 3 refills | Status: AC

## 2023-04-20 MED ORDER — LOSARTAN POTASSIUM 50 MG PO TABS: 50.0000 mg | ORAL_TABLET | Freq: Every day | ORAL | 3 refills | Status: AC

## 2023-04-20 MED ORDER — TAFAMIDIS 61 MG PO CAPS: 61.0000 mg | ORAL_CAPSULE | Freq: Every day | ORAL | 11 refills | Status: AC

## 2023-04-20 NOTE — Progress Notes (Signed)
Established patient visit   Patient: Justin Warren   DOB: [birth year]   78 y.o. Male  MRN: [id number] Visit Date: 04/20/2023  Today's healthcare provider: Sherlyn Hay, DO   Chief Complaint  Patient presents with   Follow-up    Was being seen at the [facility] in [place], but patient is struggling to get to appointments there and would like to return his primary care to [facility]   Subjective    HPI The patient, a [place] War veteran, presents with a history of cardiovascular disease, including a pacemaker and two stents. He has been receiving care at the [facility] but is seeking to transfer care locally due to difficulties with transportation and distance. The patient has also been dealing with grief and emotional distress following the recent loss of his spouse. - he lost his wife in September and has lost 25-30 pounds since then.  - he is not eating; he has no appetite.   The patient reports a history of neuropathy, which has led to the need for braces. He denies experiencing pain, burning, or electrical shocks in his feet, but reports numbness. He has been prescribed gabapentin in the past but has discontinued it.  The patient has a history of smoking but quit around 1991. He has been taking multiple medications, including Eliquis, a statin, and several blood pressure medications. He also reports taking tafamidis, a medication for cardiac amyloidosis, which is provided through a research program due to its high cost.  The patient has expressed frustration with the number of tests and lack of communication about the results from his previous care providers. He has also expressed concerns about the cost of his medications, despite being told that he would not have to pay for them at the [place]. He would like to try using [facility].  The patient has a history of shingles and has received the older vaccine. He reports a very mild case with minimal scaling or itching.  He has also been offered the newer shingles vaccine but has not yet decided whether to receive it.  The patient's emotional well-being is a significant concern. He reports spending most of his time alone, crying frequently, and feeling a lack of purpose since the loss of his spouse. He has been offered counseling services through the [place] but has not yet received a call. He expresses a desire to speak with a counselor to help manage his grief.    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  - Echo 08/2021 with normal biventricular systolic function, moderate LVH, mild aortic stenosis.  - Cardiac MRI 09/2021 with LVEF of 55%, severe concentric LVH, mild to moderate RV dysfunction, moderate aortic stenosis based on planimetry, findings suggestive of cardiac amyloidosis."      Medications: Outpatient Medications Prior to Visit  Medication Sig Note   acetaminophen (TYLENOL) 650 MG CR tablet Take 650 mg by mouth every 8 (eight) hours as needed for pain.    omeprazole (PRILOSEC) 40 MG capsule Take 1 capsule (40 mg total) by mouth daily.    [DISCONTINUED] atorvastatin (LIPITOR) 80 MG tablet TAKE 1 TABLET EVERY DAY    [DISCONTINUED] busPIRone (BUSPAR) 7.5 MG tablet Take 1 tablet (7.5 mg total) by mouth 2 (two) times daily.    [DISCONTINUED] clopidogrel (PLAVIX)  75 MG tablet TAKE 1 TABLET EVERY DAY WITH BREAKFAST    [DISCONTINUED] dicyclomine (BENTYL) 20 MG tablet Take 1 tablet (20 mg total) by mouth 3 (three) times daily before meals. 05/01/2023: pt's daughter reports current 10mg  dose   [DISCONTINUED] donepezil (ARICEPT ODT) 5 MG disintegrating tablet Take 1 tablet (5 mg total) by mouth at bedtime.    [DISCONTINUED] ELIQUIS 5 MG TABS tablet Take 1 tablet (5 mg total) by mouth 2 (two) times  daily.    [DISCONTINUED] FLUoxetine (PROZAC) 20 MG capsule Take 1 capsule (20 mg total) by mouth daily. 05/01/2023: pt's daughter reports current dose of 10mg    [DISCONTINUED] losartan (COZAAR) 50 MG tablet TAKE 1 TABLET EVERY DAY (NEED MD APPOINTMENT)    [DISCONTINUED] metoprolol succinate (TOPROL-XL) 25 MG 24 hr tablet Take 1 tablet (25 mg total) by mouth daily.    [DISCONTINUED] Tafamidis 61 MG CAPS Take by mouth.    [DISCONTINUED] albuterol (VENTOLIN HFA) 108 (90 Base) MCG/ACT inhaler Inhale 2 puffs into the lungs every 6 (six) hours as needed for wheezing or shortness of breath.    [DISCONTINUED] benzonatate (TESSALON) 200 MG capsule Take 1 capsule (200 mg total) by mouth 2 (two) times daily as needed for cough. (Patient not taking: Reported on 07/05/2022)    [DISCONTINUED] dextromethorphan-guaiFENesin (MUCINEX DM) 30-600 MG 12hr tablet Take 1 tablet by mouth 2 (two) times daily as needed for cough. (Patient not taking: Reported on 07/05/2022)    [DISCONTINUED] predniSONE (STERAPRED UNI-PAK 21 TAB) 10 MG (21) TBPK tablet Take per package directions. (Patient not taking: Reported on 07/05/2022)    No facility-administered medications prior to visit.        Objective    BP 138/73   Pulse 67   Resp 20   Ht 5\' 9"  (1.753 m)   Wt 172 lb 1.6 oz (78.1 kg)   SpO2 98%   BMI 25.41 kg/m     Physical Exam Vitals and nursing note reviewed.  Constitutional:      General: He is not in acute distress.    Appearance: Normal appearance.  HENT:     Head: Normocephalic and atraumatic.  Eyes:     General: No scleral icterus.    Conjunctiva/sclera: Conjunctivae normal.  Cardiovascular:     Rate and Rhythm: Normal rate.  Pulmonary:     Effort: Pulmonary effort is normal.  Neurological:     Mental Status: He is alert and oriented to person, place, and time. Mental status is at baseline.  Psychiatric:        Mood and Affect: Mood normal.        Behavior: Behavior normal.      No results found  for any visits on 04/20/23.  Assessment & Plan    Grief reaction with prolonged bereavement Assessment & Plan: Experiencing significant grief and depression following the loss of spouse in September. Reports frequent crying and lack of engagement in activities. No current counseling in place. Discussed potential benefits of counseling and patient's uncertainty about [facility]. - Send referral to psychology for counseling services - Follow up in 4 weeks to assess mental health status  Orders: -     AMB Referral VBCI Care Management -     Ambulatory referral to Psychology -     FLUoxetine HCl; Take 1 capsule (10 mg total) by mouth daily.  Dispense: 90 capsule; Refill: 3  Wild-type transthyretin-related (ATTR) amyloidosis (HCC) Assessment & Plan: Cardiac amyloidosis followed by [facility]. Sent tafamidis prescription to  [facility] per patient request to have all medications through [facility], though he may have to continue getting it through the specialty pharmacy he has been getting it from.   Orders: -     AMB Referral VBCI Care Management -     Tafamidis; Take 1 capsule (61 mg total) by mouth daily.  Dispense: 30 capsule; Refill: 11  Paroxysmal atrial fibrillation (HCC) Assessment & Plan: Currently on Eliquis and a statin. Expressed frustration with frequent testing at [facility] and has been seeing cardiology at [facility] for over a year. Discussed importance of continuing blood pressure medications to prevent stent re-occlusion and potential complications. - [facility] - Continue current medications including Eliquis and statin - Follow up with cardiology as needed    Chronic heart failure with preserved ejection fraction [facility]) Assessment & Plan: Cardiovascular disease with pacemaker and two stents.  Medication Management Difficulty managing medications and concerned about cost. Currently taking donepezil, metoprolol,  Eliquis, atorstatin, and tafamidis. Unsure about copays and expressed desire to transfer prescriptions to [facility]. Discussed potential copay issues and need to contact [facility] for confirmation. - Send all prescriptions to [facility] - Provide [facility] contact information - Refer to pharmacy assistance for medication cost management  Orders: -     AMB Referral VBCI Care Management  Hx of non-ST elevation myocardial infarction (NSTEMI) Assessment & Plan: Noted.  No acute concerns. Continue metoprolol XL, losartan, atorvastatin and clopidogrel.  Orders: -     Metoprolol Succinate ER; Take 1 tablet (25 mg total) by mouth daily.  Dispense: 90 tablet; Refill: 3 -     Atorvastatin Calcium; Take 1 tablet (80 mg total) by mouth daily.  Dispense: 90 tablet; Refill: 3 -     Clopidogrel Bisulfate; Take 1 tablet (75 mg total) by mouth daily.  Dispense: 90 tablet; Refill: 3  Primary hypertension Assessment & Plan: No acute concerns.  Addressed as noted above.  Continue to monitor.   Orders: -     Metoprolol Succinate ER; Take 1 tablet (25 mg total) by mouth daily.  Dispense: 90 tablet; Refill: 3 -     Losartan Potassium; Take 1 tablet (50 mg total) by mouth daily.  Dispense: 90 tablet; Refill: 3  PAD (peripheral artery disease) (HCC) Assessment & Plan: No acute concerns.  Addressed as noted above.  Continue to monitor.    Pulmonary emphysema, unspecified emphysema type (HCC) Assessment & Plan: Noted.  No acute concerns.  Continue to monitor.  Orders: -     AMB Referral VBCI Care Management  Foot drop, bilateral Assessment & Plan: Noted.  Using braces regularly.  No acute concerns.  Continue to monitor.   Presence of heart assist device [facility]) Assessment & Plan: Noted.  No acute concerns.  Continue to monitor.   Mild cognitive impairment Assessment & Plan: Refill donepezil today. Consider MMSE on follow up.  Orders: -     Donepezil HCl; Take 1 tablet (5 mg total) by mouth  at bedtime.  Dispense: 90 tablet; Refill: 3  Anxiety Assessment & Plan: Received current medication list from patient's daughter; sent in patient's fluoxetine and buspar. Referred patient to psychology. Reassess on follow up.  Orders: -     busPIRone HCl; Take 1 tablet (7.5 mg total) by mouth 2 (two) times daily.  Dispense: 180 tablet; Refill: 3  Irritable bowel syndrome, unspecified type Assessment & Plan: Refill dicyclomine today.  Orders: -     Dicyclomine HCl; Take 1 capsule (  10 mg total) by mouth 2 (two) times daily.  Dispense: 180 capsule; Refill: 3  Numbness and tingling of both legs below knees Assessment & Plan: Reports neuropathy with numbness in feet. Tried gabapentin in the past but discontinued it. No current pain, burning, or electrical shock sensations. Discussed frustration with ineffective treatments seen online and desire for effective management options. - Discuss potential treatment options for neuropathy in future visits   General Health Maintenance Declined COVID-19 vaccine but received flu shot. Had a mild case of shingles in the past and considering updated shingles vaccine. Discussed potential benefits of updated shingles vaccine. - Discuss updated shingles vaccine - Ensure flu vaccination is up to date  Follow-up - Schedule follow-up appointment in 4 weeks - Request records from [place] to ensure continuity of care.   Return in about 4 weeks (around 05/18/2023) for recheck, f/u.      I discussed the assessment and treatment plan with the patient  The patient was provided an opportunity to ask questions and all were answered. The patient agreed with the plan and demonstrated an understanding of the instructions.   The patient was advised to call back or seek an in-person evaluation if the symptoms worsen or if the condition fails to improve as anticipated.    Sherlyn Hay, DO  [facility] [phone] (phone) [phone]  (fax)  [facility]

## 2023-04-20 NOTE — Patient Instructions (Signed)
[facility] - [place], [place] - [address] [phone]

## 2023-04-21 ENCOUNTER — Telehealth: Payer: Self-pay | Admitting: *Deleted

## 2023-04-21 NOTE — Progress Notes (Signed)
Complex Care Management Note Care Guide Note  04/21/2023 Name: Justin Warren MRN: [id number] DOB: October 09, 1945   Complex Care Management Outreach Attempts: An unsuccessful telephone outreach was attempted today to offer the patient information about available complex care management services.  Follow Up Plan:  Additional outreach attempts will be made to offer the patient complex care management information and services.   Encounter Outcome:  No Answer  Burman Nieves, CMA, Care Guide [facility]  [facility], [facility] Guide Direct Dial: [phone]  Fax: [phone] Website: [email or web address]

## 2023-04-22 NOTE — Progress Notes (Unsigned)
Complex Care Management Note Care Guide Note  04/22/2023 Name: Justin Warren MRN: [id number] DOB: [birth year]   Complex Care Management Outreach Attempts: A second unsuccessful outreach was attempted today to offer the patient with information about available complex care management services.  Follow Up Plan:  Additional outreach attempts will be made to offer the patient complex care management information and services.   Encounter Outcome:  No Answer  Burman Nieves, CMA, Care Guide [facility]  [name], [facility] Guide Direct Dial: [phone]  Fax: [phone] Website: [email or web address]

## 2023-04-25 NOTE — Progress Notes (Signed)
Complex Care Management Note Care Guide Note  04/25/2023 Name: Justin Warren MRN: [id number] DOB: [birth year]   Complex Care Management Outreach Attempts: A third unsuccessful outreach was attempted today to offer the patient with information about available complex care management services.  Follow Up Plan:  No further outreach attempts will be made at this time. We have been unable to contact the patient to offer or enroll patient in complex care management services.  Encounter Outcome:  No Answer  Burman Nieves, CMA, Care Guide [facility]  [facility], [facility] Guide Direct Dial: [phone]  Fax: [phone] Website: [email or web address]

## 2023-04-26 ENCOUNTER — Other Ambulatory Visit: Payer: Self-pay | Admitting: Family Medicine

## 2023-04-26 DIAGNOSIS — I480 Paroxysmal atrial fibrillation: Secondary | ICD-10-CM

## 2023-04-26 DIAGNOSIS — I252 Old myocardial infarction: Secondary | ICD-10-CM

## 2023-04-26 NOTE — Telephone Encounter (Signed)
Medication Refill -  Most Recent Primary Care Visit:  Provider: Sherlyn Hay  Department: [facility]  Visit Type: OFFICE VISIT  Date: 04/20/2023  Medication: ELIQUIS 5 MG TABS tablet   Has the patient contacted their pharmacy? No Med, went to [facility] in the beginning, but the says it will cost him 140.00 a month from there he can't afford, so wants it to go to the [place]   (Is this the correct pharmacy for this prescription? yes If no, delete pharmacy and type the correct one.  This is the patient's preferred pharmacy:   [facility] - [place], [place] - [address] [place] [address] Phone: [phone] Fax: [phone]   Has the prescription been filled recently? Yes but its as a different pharmacy he can't afford  Is the patient out of the medication? yes  Has the patient been seen for an appointment in the last year OR does the patient have an upcoming appointment? yes  Can we respond through MyChart? yes  Agent: Please be advised that Rx refills may take up to 3 business days. We ask that you follow-up with your pharmacy.

## 2023-04-26 NOTE — Telephone Encounter (Signed)
Transfer to [facility] clinic pharmacy.

## 2023-04-28 ENCOUNTER — Telehealth: Payer: Self-pay

## 2023-04-28 MED ORDER — ELIQUIS 5 MG PO TABS: 5.0000 mg | ORAL_TABLET | Freq: Two times a day (BID) | ORAL | 3 refills | Status: DC

## 2023-04-28 NOTE — Telephone Encounter (Signed)
Change of pharmacy   Requested Prescriptions  Pending Prescriptions Disp Refills   ELIQUIS 5 MG TABS tablet 180 tablet 3    Sig: Take 1 tablet (5 mg total) by mouth 2 (two) times daily.     Hematology:  Anticoagulants - apixaban Failed - 04/28/2023 11:48 AM      Failed - PLT in normal range and within 360 days    Platelets  Date Value Ref Range Status  07/07/2021 274 150 - 450 x10E3/uL Final         Failed - HGB in normal range and within 360 days    Hemoglobin  Date Value Ref Range Status  07/07/2021 15.1 13.0 - 17.7 g/dL Final         Failed - HCT in normal range and within 360 days    Hematocrit  Date Value Ref Range Status  07/07/2021 44.4 37.5 - 51.0 % Final         Failed - Cr in normal range and within 360 days    Creat  Date Value Ref Range Status  12/16/2016 1.02 0.70 - 1.18 mg/dL Final    Comment:    For patients >57 years of age, the reference limit for Creatinine is approximately 13% higher for people identified as African-American. .    Creatinine, Ser  Date Value Ref Range Status  07/07/2021 0.87 0.76 - 1.27 mg/dL Final         Failed - AST in normal range and within 360 days    AST  Date Value Ref Range Status  07/07/2021 26 0 - 40 IU/L Final         Failed - ALT in normal range and within 360 days    ALT  Date Value Ref Range Status  07/07/2021 26 0 - 44 IU/L Final         Passed - Valid encounter within last 12 months    Recent Outpatient Visits           1 week ago Grief reaction with prolonged bereavement   [facility] Pardue, Monico Blitz, DO   1 year ago Rib pain   [place] [facility] Caro Laroche, DO   1 year ago Annual physical exam   [facility] Merita Norton T, FNP   1 year ago Ulcerative colitis with rectal bleeding, unspecified location [facility])   [facility] Merita Norton T, FNP   2 years ago NSTEMI (non-ST elevated myocardial  infarction) [facility])   [facility] Caro Laroche, DO       Future Appointments             In 3 weeks Pardue, Monico Blitz, DO [place] [facility]

## 2023-04-28 NOTE — Telephone Encounter (Signed)
Copied from CRM [id number]. Topic: General - Other >> Apr 27, 2023  1:10 PM Priscille Loveless wrote: Reason for CRMPts daughter is wanting to know if office received fax of current medication List. Please advise, [phone]  I looked and did not see the fax.

## 2023-05-01 ENCOUNTER — Encounter: Payer: Self-pay | Admitting: Family Medicine

## 2023-05-01 DIAGNOSIS — F4329 Adjustment disorder with other symptoms: Secondary | ICD-10-CM | POA: Insufficient documentation

## 2023-05-01 DIAGNOSIS — E8582 Wild-type transthyretin-related (ATTR) amyloidosis: Secondary | ICD-10-CM | POA: Insufficient documentation

## 2023-05-01 DIAGNOSIS — G3184 Mild cognitive impairment, so stated: Secondary | ICD-10-CM | POA: Insufficient documentation

## 2023-05-01 MED ORDER — CLOPIDOGREL BISULFATE 75 MG PO TABS: 75.0000 mg | ORAL_TABLET | Freq: Every day | ORAL | 3 refills | Status: DC

## 2023-05-01 MED ORDER — FLUOXETINE HCL 10 MG PO CAPS: 10.0000 mg | ORAL_CAPSULE | Freq: Every day | ORAL | 3 refills | Status: AC

## 2023-05-01 MED ORDER — BUSPIRONE HCL 7.5 MG PO TABS: 7.5000 mg | ORAL_TABLET | Freq: Two times a day (BID) | ORAL | 3 refills | Status: AC

## 2023-05-01 MED ORDER — DICYCLOMINE HCL 10 MG PO CAPS: 10.0000 mg | ORAL_CAPSULE | Freq: Two times a day (BID) | ORAL | 3 refills | Status: AC

## 2023-05-01 NOTE — Assessment & Plan Note (Addendum)
Refill donepezil today. Consider MMSE on follow up.

## 2023-05-01 NOTE — Assessment & Plan Note (Signed)
Currently on Eliquis and a statin. Expressed frustration with frequent testing at [facility] and has been seeing cardiology at [facility] for over a year. Discussed importance of continuing blood pressure medications to prevent stent re-occlusion and potential complications. - Request records from [facility] and [facility] - Continue current medications including Eliquis and statin - Follow up with cardiology as needed

## 2023-05-01 NOTE — Assessment & Plan Note (Signed)
Reports neuropathy with numbness in feet. Tried gabapentin in the past but discontinued it. No current pain, burning, or electrical shock sensations. Discussed frustration with ineffective treatments seen online and desire for effective management options. - Discuss potential treatment options for neuropathy in future visits

## 2023-05-01 NOTE — Assessment & Plan Note (Addendum)
No acute concerns.  Addressed as noted above.  Continue to monitor.

## 2023-05-01 NOTE — Assessment & Plan Note (Signed)
Refill dicyclomine today.

## 2023-05-01 NOTE — Assessment & Plan Note (Signed)
 Noted.  No acute concerns.  Continue to monitor.

## 2023-05-01 NOTE — Assessment & Plan Note (Signed)
Noted.  Using braces regularly.  No acute concerns.  Continue to monitor.

## 2023-05-01 NOTE — Assessment & Plan Note (Signed)
Cardiovascular disease with pacemaker and two stents.  Medication Management Difficulty managing medications and concerned about cost. Currently taking donepezil, metoprolol, Eliquis, atorstatin, and tafamidis. Unsure about copays and expressed desire to transfer prescriptions to [facility]. Discussed potential copay issues and need to contact [facility] for confirmation. - Send all prescriptions to [facility] - Provide [facility] contact information - Refer to pharmacy assistance for medication cost management

## 2023-05-01 NOTE — Assessment & Plan Note (Signed)
Received current medication list from patient's daughter; sent in patient's fluoxetine and buspar. Referred patient to psychology. Reassess on follow up.

## 2023-05-01 NOTE — Assessment & Plan Note (Addendum)
Noted.  No acute concerns. Continue metoprolol XL, losartan, atorvastatin and clopidogrel.

## 2023-05-01 NOTE — Assessment & Plan Note (Signed)
No acute concerns.  Addressed as noted above.  Continue to monitor.

## 2023-05-01 NOTE — Assessment & Plan Note (Signed)
Experiencing significant grief and depression following the loss of spouse in September. Reports frequent crying and lack of engagement in activities. No current counseling in place. Discussed potential benefits of counseling and patient's uncertainty about [facility]. - Send referral to psychology for counseling services - Follow up in 4 weeks to assess mental health status

## 2023-05-01 NOTE — Assessment & Plan Note (Signed)
Cardiac amyloidosis followed by [facility] amyloidosis clinic. Sent tafamidis prescription to [facility] per patient request to have all medications through [facility] pharmacy, though he may have to continue getting it through the specialty pharmacy he has been getting it from.

## 2023-05-05 NOTE — Telephone Encounter (Signed)
 I called Delon to let her know we have made updates from the list that was provided to us .  She said on the list there is a medication that began with the letter V that he was receiving from the [place] on a trial basis. She wants to know if he will be able to continue to receive the medication. I am not sure what she is referring to and don't see a medication like that on his current list. She believes it was a mediation for his heart, but she is not certain of that either. Did you see this on the list Dr. Donzella? I think she said it was the second medication on the list.  She said he just received some refills from [place] and she wasn't sure if she should cancel them and have us  re-prescribe. I told her that he should keep the meds he has received from [place] and plan to have us  fill his future medications. She verbalized understanding.

## 2023-05-09 NOTE — Telephone Encounter (Signed)
 I spoke to [place] and relayed information above. She doesn't believe the [place] will continue to prescribe since patient is no longer going to be seen there. She will see what [employer] will cover and if the medication will be affordable for him. They will contact us  if there are any further issues.

## 2023-05-16 DIAGNOSIS — L821 Other seborrheic keratosis: Secondary | ICD-10-CM | POA: Diagnosis not present

## 2023-05-16 DIAGNOSIS — D485 Neoplasm of uncertain behavior of skin: Secondary | ICD-10-CM | POA: Diagnosis not present

## 2023-05-16 DIAGNOSIS — D2261 Melanocytic nevi of right upper limb, including shoulder: Secondary | ICD-10-CM | POA: Diagnosis not present

## 2023-05-16 DIAGNOSIS — D2262 Melanocytic nevi of left upper limb, including shoulder: Secondary | ICD-10-CM | POA: Diagnosis not present

## 2023-05-16 DIAGNOSIS — L570 Actinic keratosis: Secondary | ICD-10-CM | POA: Diagnosis not present

## 2023-05-16 DIAGNOSIS — D2271 Melanocytic nevi of right lower limb, including hip: Secondary | ICD-10-CM | POA: Diagnosis not present

## 2023-05-16 DIAGNOSIS — D2272 Melanocytic nevi of left lower limb, including hip: Secondary | ICD-10-CM | POA: Diagnosis not present

## 2023-05-16 DIAGNOSIS — D225 Melanocytic nevi of trunk: Secondary | ICD-10-CM | POA: Diagnosis not present

## 2023-05-16 DIAGNOSIS — L820 Inflamed seborrheic keratosis: Secondary | ICD-10-CM | POA: Diagnosis not present

## 2023-05-20 ENCOUNTER — Ambulatory Visit: Payer: Self-pay | Admitting: Family Medicine

## 2023-05-20 ENCOUNTER — Other Ambulatory Visit: Payer: Self-pay | Admitting: Family Medicine

## 2023-05-20 DIAGNOSIS — I252 Old myocardial infarction: Secondary | ICD-10-CM

## 2023-05-20 DIAGNOSIS — I480 Paroxysmal atrial fibrillation: Secondary | ICD-10-CM

## 2023-05-20 MED ORDER — ELIQUIS 5 MG PO TABS: 5.0000 mg | ORAL_TABLET | Freq: Two times a day (BID) | ORAL | 3 refills | Status: AC

## 2023-05-24 ENCOUNTER — Telehealth: Payer: Self-pay

## 2023-05-24 NOTE — Telephone Encounter (Signed)
 Copied from CRM [id number]. Topic: Clinical - Prescription Issue >> May 24, 2023  9:53 AM Shelah Lewandowsky wrote: Reason for CRM: Tafamidis 61 MG CAPS  was sent to the pharmacy that should have been filled by the [facility].  It costs $1100.00 and he can't afford that.  Please call daughter Victorino Dike [phone]

## 2023-05-26 ENCOUNTER — Ambulatory Visit: Payer: Self-pay | Admitting: Family Medicine

## 2023-05-26 NOTE — Telephone Encounter (Signed)
 Chief Complaint: Medication Problem   Disposition: [] ED /[] Urgent Care (no appt availability in office) / [] Appointment(In office/virtual)/ []  [place] [facility]/ [] Home Care/ [] Refused Recommended Disposition /[]  Mobile Bus/ [x]  Follow-up with PCP Additional Notes: Patient's daughter Justin Warren (signed DPR on file) stated that the patient asked for all of his medications to refilled and sent to [facility] pharmacy that was on his current medication list. 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 Tafamidis 61 MG is a drug that the patient is taking in trail for the [place]. [facility]. Please cancel the additional refills on file with [facility] so the patient is not charged the $1,100 fee for the medication. If PCP has additional questions please let Justin Warren know.   Copied from CRM [id number]. Topic: Clinical - Medication Question >> May 26, 2023 10:40 AM Philippa Chester F wrote: Reason for CRM: Tafamidis 61 MG CAPS  Patient daughter, Justin Warren, called in stating she did not want the prescription listed to not have it sent to the pharmacy as what was done previously. She stated she wanted to speak with the Dr. Payton Warren or a clinical staff member personally to ensure it is not ordered again. Justin Warren did not want to relay specific instructions as she wants to relay further information in reference to his health and the medication. Reason for Disposition  [1] Caller has NON-URGENT medicine question about med that PCP prescribed AND [2] triager unable to answer question  Answer Assessment - Initial Assessment Questions 1. NAME of MEDICINE: "What medicine(s) are you calling about?"     Tafamidis 61 MG  2. QUESTION: "What is your question?" (e.g., double dose of medicine, side effect)     This is trail drug that has to stay at the [facility], please do not refill or send [facility]  3. PRESCRIBER: "Who prescribed the medicine?" Reason: if prescribed by specialist, call  should be referred to that group.     Dr. Payton Warren  Protocols used: Medication Question Call-A-AH

## 2023-05-26 NOTE — Telephone Encounter (Signed)
 I have called [facility] and canceled further refills on the following medication,Tafamidis 61 mg caps.  Spoke with patient's daughter Justin Warren and made aware that I have canceled the prescription with [facility].

## 2023-05-30 NOTE — Telephone Encounter (Signed)
 I called patient's daughter Victorino Dike and she would like Dr. Payton Mccallum to call her please. Stated there's a lot of confusion going on.

## 2023-05-30 NOTE — Telephone Encounter (Signed)
 Spoke with patient's daughter, Dierdre Forth, and recommended he continue the eliquis due to his paroxysmal atrial fibrillation (CHA2DS2-VASC score 5). Discussed possibility of reducing the dose in setting of reported falls. Patient's daughter will be reaching out to schedule follow-ups with cardiology (both at [facility] and at the amyloidosis clinic at [facility]) for further recommendations. Clarified that, based on what I can find in his record, he does seem to be receiving the Tafamidis from the [facility] in [place].

## 2023-05-31 ENCOUNTER — Telehealth: Payer: Self-pay | Admitting: *Deleted

## 2023-05-31 NOTE — Progress Notes (Signed)
 Complex Care Management Note  Care Guide Note 05/31/2023 Name: CANNAN BEECK MRN: [id number] DOB: [birth year]  IYAD DEROO is a 78 y.o. year old male who sees Pardue, Monico Blitz, DO for primary care. I reached out to [name] by phone today to offer complex care management services.  Mr. Dugar was given information about Complex Care Management services today including:   The Complex Care Management services include support from the care team which includes your Nurse Care Manager, Clinical Social Worker, or Pharmacist.  The Complex Care Management team is here to help remove barriers to the health concerns and goals most important to you. Complex Care Management services are voluntary, and the patient may decline or stop services at any time by request to their care team member.   Complex Care Management Consent Status: Patient agreed to services and verbal consent obtained.   Follow up plan:  Telephone appointment with complex care management team member scheduled for:  06/10/2023  Encounter Outcome:  Patient Scheduled  Burman Nieves, CMA, Care Guide [facility]  [facility], [facility] Guide Direct Dial: [phone]  Fax: [phone] Website: [email or web address]

## 2023-06-10 ENCOUNTER — Ambulatory Visit: Payer: Self-pay | Admitting: *Deleted

## 2023-06-12 NOTE — Patient Instructions (Signed)
 Visit Information  Thank you for taking time to visit with me today. Please don't hesitate to contact me if I can be of assistance to you.   Following are the goals we discussed today:   Goals Addressed             This Visit's Progress    care coordintion activities         EMOTIONAL / MENTAL HEALTH SUPPORT Keep all upcoming appointment discussed today Continue with compliance of taking medication prescribed by Doctor Self Support options  (continue to utilize healthy coping skills discussed today to assist with management of symptoms, including participation in the grief study through your church community, continue to consider ongoing mental health counseling) Call your provider office for new concerns or questions          Our next appointment is by telephone on 07/11/23 at 1pm  Please call the care guide team at [phone] if you need to cancel or reschedule your appointment.   If you are experiencing a Mental Health or Behavioral Health Crisis or need someone to talk to, please call the Suicide and Crisis Lifeline: 988   Patient verbalizes understanding of instructions and care plan provided today and agrees to view in MyChart. Active MyChart status and patient understanding of how to access instructions and care plan via MyChart confirmed with patient.     Telephone follow up appointment with care management team member scheduled for: 07/11/23  Verna Czech, LCSW [place]  [facility], [facility] Licensed Clinical Social Worker Care Coordinator  Direct Dial: [phone]

## 2023-06-12 NOTE — Patient Outreach (Signed)
 Care Coordination   Initial Visit Note   06/12/2023 late entry Name: Justin Warren MRN: [id number] DOB: 1945/05/15  Justin Warren is a 78 y.o. year old male who sees Pardue, Monico Blitz, DO for primary care. I spoke with  Justin Warren by phone on 06/10/23  What matters to the patients health and wellness today?  Patient confirms challenges dealing with the sudden loss of his spouse.    Goals Addressed             This Visit's Progress    care coordintion activities         EMOTIONAL / MENTAL HEALTH SUPPORT Keep all upcoming appointment discussed today Continue with compliance of taking medication prescribed by Doctor Self Support options  (continue to utilize healthy coping skills discussed today to assist with management of symptoms, including participation in the grief study through your church community, continue to consider ongoing mental health counseling) Call your provider office for new concerns or questions          SDOH assessments and interventions completed:  Yes  SDOH Interventions Today    Flowsheet Row Most Recent Value  SDOH Interventions   Food Insecurity Interventions Intervention Not Indicated  Housing Interventions Intervention Not Indicated  Transportation Interventions Intervention Not Indicated  Utilities Interventions Intervention Not Indicated        Care Coordination Interventions:  Yes, provided  Interventions Today    Flowsheet Row Most Recent Value  Chronic Disease   Chronic disease during today's visit --  [Grief reaction with prolonged bereavement]  General Interventions   General Interventions Discussed/Reviewed General Interventions Discussed, [facility], Doctor Visits  [patient assessed for community resource/mental health needs due to loss of spouse]  Doctor Visits Discussed/Reviewed Annual Wellness Visits  [07/06/23 AWV]  Mental Health Interventions   Mental Health Discussed/Reviewed Mental Health Discussed, Coping  Strategies, Grief and Loss  [grief response normalized, encouraged verbalization of feelings, agreeable to participating in grief study at church to start in April, confirms supportive family-daughters visit weekly as well as classmate-declines resources for [employer] grief support]  Pharmacy Interventions   Pharmacy Dicussed/Reviewed Pharmacy Topics Discussed, Medication Adherence  [Confirms that daughters fill pill box weekly]  Safety Interventions   Safety Discussed/Reviewed Safety Discussed  [denies thoughts of harm to self or others, encouraged patient to contact 988 in the event of a mental health crisis]       Follow up plan: Follow up call scheduled for 07/11/23    Encounter Outcome:  Patient Visit Completed

## 2023-07-11 ENCOUNTER — Encounter: Payer: Self-pay | Admitting: *Deleted

## 2023-07-13 ENCOUNTER — Other Ambulatory Visit: Payer: Self-pay

## 2023-07-13 ENCOUNTER — Ambulatory Visit: Payer: Self-pay | Admitting: *Deleted

## 2023-07-13 NOTE — Patient Instructions (Signed)
 Visit Information  Thank you for taking time to visit with me today. Please don't hesitate to contact me if I can be of assistance to you before our next scheduled appointment.  Our next appointment is by telephone on 07/28/23 at 1pm Please call the care guide team at [phone] if you need to cancel or reschedule your appointment.   Following is a copy of your care plan:   Goals Addressed             This Visit's Progress    VBCI Social Work Care Plan       Problems:   Depression  , Grief and Loss  CSW Clinical Goal(s):   Over the next 90 days the Patient will attend all scheduled medical appointments as evidenced by patient report and care team review of appointment completion in electronic MEDICAL RECORD NUMBER  demonstrate a reduction in symptoms related to Depression: depressed mood disturbed sleep hopelessness loss of energy/fatigue .  Interventions:  Mental Health:  Evaluation of current treatment plan related to Grief Active listening / Reflection utilized Depression screen reviewed Emotional Support Provided Motivational Interviewing employed Participation in counseling encourage :   Patient Goals/Self-Care Activities:  Patient  to consider referral for Grief counseling as participation in grief study through the local church  Plan:   Telephone follow up appointment with care management team member scheduled for:  07/26/33        Please call 911 if you are experiencing a Mental Health or Behavioral Health Crisis or need someone to talk to.  Patient verbalizes understanding of instructions and care plan provided today and agrees to view in MyChart. Active MyChart status and patient understanding of how to access instructions and care plan via MyChart confirmed with patient.     Rossy Virag, LCSW [place]  [facility], [facility] Licensed Clinical Social Worker Care Coordinator  Direct Dial: [phone]

## 2023-07-13 NOTE — Patient Outreach (Signed)
 Complex Care Management   Visit Note  07/13/2023  Name:  Justin Warren MRN: [id number] DOB: 10-Apr-1945  Situation: Referral received for Complex Care Management related to Menta/Behavioral Health diagnosis depression(grief and loss)  I obtained verbal consent from Patient.  Visit completed with patient   on the phone  Background:   Past Medical History:  Diagnosis Date   Anxiety    Bradycardia    GERD (gastroesophageal reflux disease)    History of kidney stones    Hyperlipidemia    Hypertension    Kidney stone    Neuropathy     Assessment: Patient Reported Symptoms:  Cognitive Cognitive Status: Alert and oriented to person, place, and time, Insightful and able to interpret abstract concepts, Normal speech and language skills      Neurological      HEENT HEENT Symptoms Reported: Change or loss of hearing (Has earing aids) HEENT Management Strategies: Coping strategies (wears hearing aids)    Cardiovascular Cardiovascular Symptoms Reported: No symptoms reported Does patient have uncontrolled Hypertension?: No Cardiovascular Conditions: Hypertension, Heart failure, 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) Cardiovascular Management Strategies: Medical device, Medication therapy (pacemaker)  Respiratory Respiratory Symptoms Reported: No symptoms reported    Endocrine Patient reports the following symptoms related to hypoglycemia or hyperglycemia : No symptoms reported    Gastrointestinal Gastrointestinal Symptoms Reported: Diarrhea Additional Gastrointestinal Details: Loose stools "at times"      Genitourinary      Integumentary Integumentary Symptoms Reported: Bruising Additional Integumentary Details: Per patient, skin bruises easily Skin Management Strategies:  (per patient, bruising usually resolves on it's on)  Musculoskeletal Musculoskelatal Symptoms Reviewed: Unsteady gait, Difficulty walking Additional Musculoskeletal Details: Per patient, unstady gait, balance  issues are due to neuropathy Musculoskeletal Conditions: Mobility limited, Unsteady gait Musculoskeletal Management Strategies: Medication therapy Falls in the past year?: No Number of falls in past year: 1 or less Was there an injury with Fall?: No Fall Risk Category Calculator: 0 Patient Fall Risk Level: Low Fall Risk Patient at Risk for Falls Due to: Impaired balance/gait, Impaired mobility  Psychosocial Psychosocial Symptoms Reported: Sadness - if selected complete PHQ 2-9 Behavioral Health Conditions: Depression Behavioral Management Strategies: Coping strategies, Counseling, Medication therapy Major Change/Loss/Stressor/Fears (CP): Death of a loved one Techniques to Cope with Loss/Stress/Change: Diversional activities, Spiritual practice(s) Quality of Family Relationships: supportive (3 daughters check on pt regualrly, arranges his pill box weekly) Do you feel physically threatened by others?: No      07/13/2023   11:49 AM  Depression screen PHQ 2/9  Decreased Interest 1  Down, Depressed, Hopeless 2  PHQ - 2 Score 3  Altered sleeping 2  Tired, decreased energy 1  Change in appetite 2  Feeling bad or failure about yourself  0  Trouble concentrating 1  Moving slowly or fidgety/restless 0  Suicidal thoughts 0  PHQ-9 Score 9    There were no vitals filed for this visit.  Medications Reviewed Today     Reviewed by Ave Leisure, LCSW (Social Worker) on 07/13/23 at 1148  Med List Status: <None>   Medication Order Taking? Sig Documenting Provider Last Dose Status Informant  acetaminophen (TYLENOL) 650 MG CR tablet [id number] Yes Take 650 mg by mouth every 8 (eight) hours as needed for pain. [provider] Taking Active Self  atorvastatin (LIPITOR) 80 MG tablet [id number] Yes Take 1 tablet (80 mg total) by mouth daily. Carlean Charter, DO Taking Active   busPIRone (BUSPAR) 7.5 MG tablet [id number] Yes Take 1  tablet (7.5 mg total) by mouth 2 (two) times daily. Carlean Charter, DO Taking Active   clopidogrel (PLAVIX) 75 MG tablet [id number] Yes Take 1 tablet (75 mg total) by mouth daily. Carlean Charter, DO Taking Active   dicyclomine (BENTYL) 10 MG capsule [id number] Yes Take 1 capsule (10 mg total) by mouth 2 (two) times daily. Carlean Charter, DO Taking Active   donepezil (ARICEPT ODT) 5 MG disintegrating tablet [id number] Yes Take 1 tablet (5 mg total) by mouth at bedtime. Carlean Charter, DO Taking Active   ELIQUIS 5 MG TABS tablet [id number] Yes Take 1 tablet (5 mg total) by mouth 2 (two) times daily. Carlean Charter, DO Taking Active   FLUoxetine (PROZAC) 10 MG capsule [id number] Yes Take 1 capsule (10 mg total) by mouth daily. Carlean Charter, DO Taking Active   losartan (COZAAR) 50 MG tablet [id number] Yes Take 1 tablet (50 mg total) by mouth daily. Carlean Charter, DO Taking Active   metoprolol succinate (TOPROL-XL) 25 MG 24 hr tablet [id number] Yes Take 1 tablet (25 mg total) by mouth daily. Carlean Charter, DO Taking Active   omeprazole (PRILOSEC) 40 MG capsule [id number] Yes Take 1 capsule (40 mg total) by mouth daily. Normie Becton, FNP Taking Active   Tafamidis 61 MG CAPS [id number] Yes Take 1 capsule (61 mg total) by mouth daily. Carlean Charter, DO Taking Active             Recommendation:   CSW to continue to provide emotional support related to grief and loss and will continue to encourage referrals for ongoing grief support   Follow Up Plan:   Telephone follow-up 07/28/23     Michaelle Adolphus, LCSW [place]  [facility], [facility] Licensed Clinical Social Worker Care Coordinator  Direct Dial: [phone]

## 2023-07-28 ENCOUNTER — Other Ambulatory Visit: Payer: Self-pay | Admitting: *Deleted

## 2023-07-28 NOTE — Patient Outreach (Signed)
 Complex Care Management   Visit Note  07/28/2023  Name:  Justin Warren MRN: [id number] DOB: 03/02/1946  Situation: Referral received for Complex Care Management related to Menta/Behavioral Health diagnosis depression  I obtained verbal consent from Patient.  Visit completed with patient  on the phone  Background:   Past Medical History:  Diagnosis Date   Anxiety    Bradycardia    GERD (gastroesophageal reflux disease)    History of kidney stones    Hyperlipidemia    Hypertension    Kidney stone    Neuropathy     Assessment: Patient Reported Symptoms:  Cognitive Cognitive Status: Alert and oriented to person, place, and time, Insightful and able to interpret abstract concepts, Normal speech and language skills Cognitive/Intellectual Conditions Management [RPT]: None reported or documented in medical history or problem list      Neurological Neurological Review of Symptoms: No symptoms reported    HEENT HEENT Symptoms Reported: Not assessed      Cardiovascular Cardiovascular Symptoms Reported: No symptoms reported Cardiovascular Conditions: Heart failure, 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) Cardiovascular Management Strategies: Medical device, Medication therapy Cardiovascular Self-Management Outcome: 4 (good)  Respiratory      Endocrine Is patient diabetic?: No    Gastrointestinal Gastrointestinal Symptoms Reported: No symptoms reported Gastrointestinal Conditions: Irritable bowel syndrome Nutrition Risk Screen (CP): No indicators present  Genitourinary Genitourinary Symptoms Reported: Not assessed    Integumentary      Musculoskeletal          Psychosocial Psychosocial Symptoms Reported: Sadness - if selected complete PHQ 2-9 Additional Psychological Details: patient reports mood has imroved PHQ 2-9 completed and reflects improvement Behavioral Health Conditions: Depression Behavioral Management Strategies: Community resources, Coping strategies, Medication  therapy Behavioral Health Self-Management Outcome: 4 (good) Behavioral Health Comment: patient to begin grief share at his church starting 5/625 Major Change/Loss/Stressor/Fears (CP): Death of a loved one Techniques to [employer] with Loss/Stress/Change: Diversional activities, Spiritual practice(s) Quality of Family Relationships: supportive (3 daughters are supportive, check on him regularly and fill his pill box weekly) Do you feel physically threatened by others?: No      07/28/2023   11:23 AM  Depression screen PHQ 2/9  Decreased Interest 0  Down, Depressed, Hopeless 1  PHQ - 2 Score 1  Tired, decreased energy 1  Change in appetite 1  Feeling bad or failure about yourself  0  Trouble concentrating 1  Moving slowly or fidgety/restless 0  Suicidal thoughts 0    There were no vitals filed for this visit.  Medications Reviewed Today     Reviewed by Ave Leisure, LCSW (Social Worker) on 07/28/23 at 1123  Med List Status: <None>   Medication Order Taking? Sig Documenting Provider Last Dose Status Informant  acetaminophen  (TYLENOL ) 650 MG CR tablet [id number] Yes Take 650 mg by mouth every 8 (eight) hours as needed for pain. [provider] Taking Active Self  atorvastatin  (LIPITOR ) 80 MG tablet [id number] Yes Take 1 tablet (80 mg total) by mouth daily. Carlean Charter, DO Taking Active   busPIRone  (BUSPAR ) 7.5 MG tablet [id number] Yes Take 1 tablet (7.5 mg total) by mouth 2 (two) times daily. Carlean Charter, DO Taking Active   clopidogrel  (PLAVIX ) 75 MG tablet [id number] No Take 1 tablet (75 mg total) by mouth daily.  Patient not taking: Reported on 07/28/2023   Carlean Charter, DO Not Taking Active   dicyclomine  (BENTYL ) 10 MG capsule [id number] Yes Take 1 capsule (10 mg total) by  mouth 2 (two) times daily. Carlean Charter, DO Taking Active   donepezil  (ARICEPT  ODT) 5 MG disintegrating tablet [id number] Yes Take 1 tablet (5 mg total) by mouth at bedtime. Carlean Charter, DO Taking  Active   ELIQUIS  5 MG TABS tablet [id number] Yes Take 1 tablet (5 mg total) by mouth 2 (two) times daily. Carlean Charter, DO Taking Active   FLUoxetine  (PROZAC ) 10 MG capsule [id number] Yes Take 1 capsule (10 mg total) by mouth daily. Carlean Charter, DO Taking Active   losartan  (COZAAR ) 50 MG tablet [id number] Yes Take 1 tablet (50 mg total) by mouth daily. Carlean Charter, DO Taking Active   metoprolol  succinate (TOPROL -XL) 25 MG 24 hr tablet [id number] Yes Take 1 tablet (25 mg total) by mouth daily. Carlean Charter, DO Taking Active   omeprazole  (PRILOSEC) 40 MG capsule [id number] Yes Take 1 capsule (40 mg total) by mouth daily. Normie Becton, FNP Taking Active   Tafamidis  61 MG CAPS [id number] Yes Take 1 capsule (61 mg total) by mouth daily. Carlean Charter, DO Taking Active             Recommendation:   There are no provider recommendations at this time. Positive reinforcement provided for participation in grief share  Follow Up Plan:   Telephone follow-up 08/18/23  Michaelle Adolphus, LCSW [place]  [facility], [facility] Health Licensed Clinical Social Worker Care Coordinator  Direct Dial: [phone]

## 2023-07-28 NOTE — Patient Instructions (Signed)
 Visit Information  Thank you for taking time to visit with me today. Please don't hesitate to contact me if I can be of assistance to you before our next scheduled appointment.  Your next care management appointment is by telephone on 08/18/23 at 11am  Telephone follow-up 08/18/23  Please call the care guide team at [phone] if you need to cancel, schedule, or reschedule an appointment.   Please call the Suicide and Crisis Lifeline: 988 if you are experiencing a Mental Health or Behavioral Health Crisis or need someone to talk to.  Zephaniah Enyeart, LCSW [place]  [facility], [facility] Health Licensed Clinical Social Worker Care Coordinator  Direct Dial: [phone]

## 2023-08-18 ENCOUNTER — Other Ambulatory Visit: Payer: Self-pay | Admitting: *Deleted

## 2023-08-18 NOTE — Patient Instructions (Signed)
 Visit Information  Thank you for taking time to visit with me today. Please don't hesitate to contact me if I can be of assistance to you before our next scheduled appointment.  Your next care management appointment is by telephone on 09/08/23 at 11am  Telephone follow-up 09/08/23  Please call the care guide team at [phone] if you need to cancel, schedule, or reschedule an appointment.   Please call the Suicide and Crisis Lifeline: 988 if you are experiencing a Mental Health or Behavioral Health Crisis or need someone to talk to.  Justin Kunath, LCSW [name]  [facility], [facility] Health Licensed Clinical Social Worker Care Coordinator  Direct Dial: [phone]

## 2023-08-18 NOTE — Patient Outreach (Addendum)
 Complex Care Management   Visit Note  08/18/2023  Name:  Justin Warren MRN: [id number] DOB: 02-29-1946  Situation: Referral received for Complex Care Management related to Mental/Behavioral Health diagnosis depression I obtained verbal consent from Patient.  Visit completed with patient  on the phone  Background:   Past Medical History:  Diagnosis Date   Anxiety    Bradycardia    GERD (gastroesophageal reflux disease)    History of kidney stones    Hyperlipidemia    Hypertension    Kidney stone    Neuropathy     Assessment: Patient Reported Symptoms:  Cognitive Cognitive Status: Alert and oriented to person, place, and time, Insightful and able to interpret abstract concepts, Normal speech and language skills Cognitive/Intellectual Conditions Management [RPT]: None reported or documented in medical history or problem list      Neurological Neurological Review of Symptoms: No symptoms reported    HEENT HEENT Symptoms Reported: No symptoms reported (patient has hearing aids)      Cardiovascular Cardiovascular Symptoms Reported: No symptoms reported Cardiovascular Conditions: Heart failure, High blood cholesterol (patient has a pacemaker)  Respiratory Respiratory Symptoms Reported: No symptoms reported    Endocrine Patient reports the following symptoms related to hypoglycemia or hyperglycemia : No symptoms reported Is patient diabetic?: No    Gastrointestinal Gastrointestinal Symptoms Reported: No symptoms reported      Genitourinary Genitourinary Symptoms Reported: No symptoms reported    Integumentary Integumentary Symptoms Reported: No symptoms reported    Musculoskeletal Musculoskelatal Symptoms Reviewed: Unsteady gait Additional Musculoskeletal Details: per patient, unsteady gait is due to neuropathy in his feet, multiple falls Musculoskeletal Conditions: Mobility limited Musculoskeletal Management Strategies: Coping strategies Falls in the past year?:  Yes Number of falls in past year: 2 or more Was there an injury with Fall?: No Fall Risk Category Calculator: 2 Patient Fall Risk Level: Moderate Fall Risk Patient at Risk for Falls Due to: Impaired mobility Fall risk Follow up: Education provided  Psychosocial Psychosocial Symptoms Reported: Depression - if selected complete PHQ 2-9 Additional Psychological Details: patient reports mood continues to improve, "I don't feel as sad as I did" Behavioral Health Conditions: Depression Behavioral Management Strategies: Community resources, Coping strategies, Medication therapy Behavioral Health Comment: patient has begun weekly grief share groups, plans to also visit [facility] center on Tuesday to follow up on activites provided there as well Major Change/Loss/Stressor/Fears (CP): Death of a loved one Behaviors When Feeling Stressed/Fearful: Grief Share, spends time with family Techniques to [place] with Loss/Stress/Change: Diversional activities, Spiritual practice(s) Quality of Family Relationships: supportive Do you feel physically threatened by others?: No      08/18/2023   11:08 AM  Depression screen PHQ 2/9  Decreased Interest 0  Down, Depressed, Hopeless 1  PHQ - 2 Score 1    There were no vitals filed for this visit.  Medications Reviewed Today     Reviewed by Ave Leisure, LCSW (Social Worker) on 08/18/23 at 1107  Med List Status: <None>   Medication Order Taking? Sig Documenting Provider Last Dose Status Informant  acetaminophen  (TYLENOL ) 650 MG CR tablet [id number] Yes Take 650 mg by mouth every 8 (eight) hours as needed for pain. [provider] Taking Active Self  atorvastatin  (LIPITOR ) 80 MG tablet [id number] Yes Take 1 tablet (80 mg total) by mouth daily. Carlean Charter, DO Taking Active   busPIRone  (BUSPAR ) 7.5 MG tablet [id number] Yes Take 1 tablet (7.5 mg total) by mouth 2 (two) times daily. Pardue,  Asencion Blacksmith, DO Taking Active   clopidogrel  (PLAVIX ) 75  MG tablet [id number]  Take 1 tablet (75 mg total) by mouth daily.  Patient not taking: Reported on 07/28/2023   Pardue, Sarah N, DO  Active   dicyclomine  (BENTYL ) 10 MG capsule [id number] Yes Take 1 capsule (10 mg total) by mouth 2 (two) times daily. Carlean Charter, DO Taking Active   donepezil  (ARICEPT  ODT) 5 MG disintegrating tablet [id number]  Take 1 tablet (5 mg total) by mouth at bedtime.  Patient not taking: Reported on 07/28/2023   Pardue, Sarah N, DO  Active   ELIQUIS  5 MG TABS tablet [id number] Yes Take 1 tablet (5 mg total) by mouth 2 (two) times daily. Carlean Charter, DO Taking Active   FLUoxetine  (PROZAC ) 10 MG capsule [id number] Yes Take 1 capsule (10 mg total) by mouth daily. Carlean Charter, DO Taking Active   losartan  (COZAAR ) 50 MG tablet [id number] Yes Take 1 tablet (50 mg total) by mouth daily. Carlean Charter, DO Taking Active   metoprolol  succinate (TOPROL -XL) 25 MG 24 hr tablet [id number] Yes Take 1 tablet (25 mg total) by mouth daily. Carlean Charter, DO Taking Active   omeprazole  (PRILOSEC) 40 MG capsule [id number] Yes Take 1 capsule (40 mg total) by mouth daily. Normie Becton, FNP Taking Active   Tafamidis  61 MG CAPS [id number] Yes Take 1 capsule (61 mg total) by mouth daily. Carlean Charter, DO Taking Active             Recommendation:   PCP Follow-up  Follow Up Plan:   Telephone follow up appointment date/time:  09/08/23 11am  Allahna Husband, LCSW [place]  [facility], [facility] Health Licensed Clinical Social Worker Care Coordinator  Direct Dial: [phone]

## 2023-08-25 ENCOUNTER — Ambulatory Visit: Payer: Self-pay

## 2023-08-25 NOTE — Telephone Encounter (Signed)
 FYI Pt has been scheduled to see Janna on 6/9

## 2023-08-25 NOTE — Telephone Encounter (Signed)
 [email or web address]  : this is the link to the form for the post office   Chief Complaint: numbness and falling Symptoms: has not fallen but will catch himself so he doesn't hit the ground Frequency: last week Pertinent Negatives: Patient denies hitting head or dizziness Disposition: [] ED /[] Urgent Care (no appt availability in office) / [] Appointment(In office/virtual)/ []  [place] [facility]/ [] Home Care/ [] Refused Recommended Disposition /[] [place] [facility]/ [x]  Follow-up with PCP Additional Notes: Needs appt   Copied from CRM [id number]. Topic: Clinical - Red Word Triage >> Aug 25, 2023 10:10 AM Elle L wrote: Red Word that prompted transfer to Nurse Triage: The patient called to request a letter from Dr. Athena Bland for [employer] to state the severity of his neuropathy so they will deliver his mail to his front porch instead of his mailbox. However, the patient informed me that his feet are completely numb and he had a few falls last week due to it that he has not been treated for. Reason for Disposition  [1] Falling (two or more falls) AND [2] in past year  Answer Assessment - Initial Assessment Questions 1. MECHANISM: "How did the fall happen?"     Walking foot wont pick up or walking around without shoes without splint  and right foot drags 2. DOMESTIC VIOLENCE AND ELDER ABUSE SCREENING: "Did you fall because someone pushed you or tried to hurt you?" If Yes, ask: "Are you safe now?"     *No Answer* 3. ONSET: "When did the fall happen?" (e.g., minutes, hours, or days ago)     *No Answer* 4. LOCATION: "What part of the body hit the ground?" (e.g., back, buttocks, head, hips, knees, hands, head, stomach)     *No Answer* 5. INJURY: "Did you hurt (injure) yourself when you fell?" If Yes, ask: "What did you injure? Tell me more about this?" (e.g., body area; type of injury; pain severity)"     *No Answer* 6. PAIN: "Is there any pain?" If Yes, ask: "How bad is the pain?"  (e.g., Scale 1-10; or mild,  moderate, severe)   - NONE (0): No pain   - MILD (1-3): Doesn't interfere with normal activities    - MODERATE (4-7): Interferes with normal activities or awakens from sleep    - SEVERE (8-10): Excruciating pain, unable to do any normal activities      *No Answer* 7. SIZE: For cuts, bruises, or swelling, ask: "How large is it?" (e.g., inches or centimeters)      no 8. PREGNANCY: "Is there any chance you are pregnant?" "When was your last menstrual period?"     no 9. OTHER SYMPTOMS: "Do you have any other symptoms?" (e.g., dizziness, fever, weakness; new onset or worsening).      *No Answer* 10. CAUSE: "What do you think caused the fall (or falling)?" (e.g., tripped, dizzy spell)       Feet numb  Protocols used: Falls and [facility]

## 2023-09-05 ENCOUNTER — Encounter: Payer: Self-pay | Admitting: Physician Assistant

## 2023-09-05 ENCOUNTER — Ambulatory Visit (INDEPENDENT_AMBULATORY_CARE_PROVIDER_SITE_OTHER): Admitting: Physician Assistant

## 2023-09-05 VITALS — BP 97/72 | HR 65 | Resp 16 | Ht 69.0 in | Wt 167.0 lb

## 2023-09-05 DIAGNOSIS — M21371 Foot drop, right foot: Secondary | ICD-10-CM

## 2023-09-05 DIAGNOSIS — E7800 Pure hypercholesterolemia, unspecified: Secondary | ICD-10-CM

## 2023-09-05 DIAGNOSIS — I739 Peripheral vascular disease, unspecified: Secondary | ICD-10-CM

## 2023-09-05 DIAGNOSIS — I10 Essential (primary) hypertension: Secondary | ICD-10-CM | POA: Diagnosis not present

## 2023-09-05 DIAGNOSIS — R5383 Other fatigue: Secondary | ICD-10-CM

## 2023-09-05 DIAGNOSIS — M21372 Foot drop, left foot: Secondary | ICD-10-CM

## 2023-09-05 DIAGNOSIS — I480 Paroxysmal atrial fibrillation: Secondary | ICD-10-CM

## 2023-09-05 DIAGNOSIS — Z974 Presence of external hearing-aid: Secondary | ICD-10-CM | POA: Insufficient documentation

## 2023-09-05 DIAGNOSIS — Z95811 Presence of heart assist device: Secondary | ICD-10-CM

## 2023-09-05 DIAGNOSIS — R200 Anesthesia of skin: Secondary | ICD-10-CM

## 2023-09-05 DIAGNOSIS — E8582 Wild-type transthyretin-related (ATTR) amyloidosis: Secondary | ICD-10-CM

## 2023-09-05 DIAGNOSIS — R202 Paresthesia of skin: Secondary | ICD-10-CM

## 2023-09-05 DIAGNOSIS — F4329 Adjustment disorder with other symptoms: Secondary | ICD-10-CM | POA: Diagnosis not present

## 2023-09-05 DIAGNOSIS — F419 Anxiety disorder, unspecified: Secondary | ICD-10-CM

## 2023-09-05 NOTE — Progress Notes (Signed)
 Established patient visit  Patient: Justin Warren   DOB: Sep 13, 1945   78 y.o. Male  MRN: [id number] Visit Date: 09/05/2023  Today's healthcare provider: Blane Bunting, PA-C   Chief Complaint  Patient presents with   Numbness    Numbness in both ft rt. Rt foot has trouble with  movement with numbness. Pt wants to discuss Red Light therapy. Pt wants to talk about B12 shots due to have no Energy.   Subjective      Discussed the use of AI scribe software for clinical note transcription with the patient, who gave verbal consent to proceed.  History of Present Illness Justin Warren is an 78 year old male who presents with worsening neuropathy symptoms and difficulty managing daily activities.  He has experienced neuropathy for four years, initially managed with gabapentin , which was discontinued due to lack of efficacy. He uses braces without improvement and has not seen his neurologist in years. His neuropathy, especially in his feet, remains his biggest problem.  He has significant mobility difficulties, using a walker and electric scooter. He struggles with daily activities like walking to the mailbox due to balance issues and lack of energy. He has lost 30 pounds since his wife's passing in September and takes fluoxetine  and Buspar .  He lives alone, with daughters visiting regularly to assist with medication management. He has a pacemaker and hearing aids from the [place], but the hearing aids have functionality issues. He has no breathing or heart problems but experiences lack of energy and balance issues.       08/18/2023   11:08 AM 07/28/2023   11:23 AM 07/13/2023   11:49 AM  Depression screen PHQ 2/9  Decreased Interest 0 0 1  Down, Depressed, Hopeless 1 1 2   PHQ - 2 Score 1 1 3   Altered sleeping   2  Tired, decreased energy  1 1  Change in appetite  1 2  Feeling bad or failure about yourself   0 0  Trouble concentrating  1 1  Moving slowly or fidgety/restless  0 0  Suicidal  thoughts  0 0  PHQ-9 Score   9       No data to display          Medications: Outpatient Medications Prior to Visit  Medication Sig   acetaminophen  (TYLENOL ) 650 MG CR tablet Take 650 mg by mouth every 8 (eight) hours as needed for pain.   atorvastatin  (LIPITOR ) 80 MG tablet Take 1 tablet (80 mg total) by mouth daily.   busPIRone  (BUSPAR ) 7.5 MG tablet Take 1 tablet (7.5 mg total) by mouth 2 (two) times daily.   dicyclomine  (BENTYL ) 10 MG capsule Take 1 capsule (10 mg total) by mouth 2 (two) times daily.   ELIQUIS  5 MG TABS tablet Take 1 tablet (5 mg total) by mouth 2 (two) times daily.   FLUoxetine  (PROZAC ) 10 MG capsule Take 1 capsule (10 mg total) by mouth daily.   losartan  (COZAAR ) 50 MG tablet Take 1 tablet (50 mg total) by mouth daily.   metoprolol  succinate (TOPROL -XL) 25 MG 24 hr tablet Take 1 tablet (25 mg total) by mouth daily.   omeprazole  (PRILOSEC) 40 MG capsule Take 1 capsule (40 mg total) by mouth daily.   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   clopidogrel  (PLAVIX ) 75 MG tablet Take 1 tablet (75 mg total) by mouth daily. (Patient not taking: Reported on 09/05/2023)   donepezil  (ARICEPT  ODT)  5 MG disintegrating tablet Take 1 tablet (5 mg total) by mouth at bedtime. (Patient not taking: Reported on 09/05/2023)   No facility-administered medications prior to visit.    Review of Systems  All other systems reviewed and are negative.  All negative Except see HPI       Objective    BP 97/72 (BP Location: Left Arm, Patient Position: Sitting)   Pulse 65   Resp 16   Ht 5\' 9"  (1.753 m)   Wt 167 lb (75.8 kg)   SpO2 98%   BMI 24.66 kg/m     Physical Exam Vitals reviewed.  Constitutional:      General: He is not in acute distress.    Appearance: Normal appearance. He is not diaphoretic.  HENT:     Head: Normocephalic and atraumatic.  Eyes:     General: No scleral icterus.    Conjunctiva/sclera: Conjunctivae normal.  Cardiovascular:      Rate and Rhythm: Normal rate and regular rhythm.     Pulses: Normal pulses.     Heart sounds: Normal heart sounds. No murmur heard. Pulmonary:     Effort: Pulmonary effort is normal. No respiratory distress.     Breath sounds: Normal breath sounds. No wheezing or rhonchi.  Musculoskeletal:     Cervical back: Neck supple.     Right lower leg: No edema.     Left lower leg: No edema.  Lymphadenopathy:     Cervical: No cervical adenopathy.  Skin:    General: Skin is warm and dry.     Findings: No rash.  Neurological:     Mental Status: He is alert and oriented to person, place, and time. Mental status is at baseline.  Psychiatric:        Mood and Affect: Mood normal.        Behavior: Behavior normal.      No results found for any visits on 09/05/23.      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 Gabapentin  ineffective. Interested in alternative therapies. - Refer to [facility] in [place]. - Instruct to contact Dr. Walden Guise for appointment. - Discussed alternative therapies such as red light therapy.  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 On fluoxetine  10 and Buspar  7.5. Benefiting from grief share program. - Encourage continued participation in grief share program. - Schedule follow-up in four weeks to discuss depression and other chronic conditions.  Fatigue Chronic Fatigue possibly related to depression or nutritional deficiencies. Recent blood work mostly normal. - Order blood work for B12, vitamin D, and thyroid  function.  Hearing Impairment Hearing aids from [place] not functioning well, possibly due to battery or calibration issues. - [facility] or [facility]  [facility]placement.  General Health Maintenance Encouraged to maintain social connections and seek community support for mental health. - Encourage communication with community and church for support.  Follow-up Follow-up plans discussed for continuity of care. - Schedule follow-up in four weeks with primary care provider. - Instruct to complete blood work today if possible. Primary hypertension Chronic and stable Continue taking losartan  50, metoprolol  25 Continue low salt diet and exercise as tolerated Will follow-up  In the setting of: Wild-type transthyretin-related (ATTR) amyloidosis (HCC)  Paroxysmal atrial  fibrillation (HCC) Continue eliquis  5mg  bid Presence of heart assist device [facility]) Managed by cardiology  PAD (peripheral artery disease) (HCC) Consider follow-up with vascular specialist  Numbness and tingling of both legs below knees/neuropathy - Ambulatory referral to Neurology  Foot drop, bilateral Cannot feel his feet - Ambulatory referral to Neurology  Hypercholesteremia Chronic and stable Continue taking atorvastatin  80mg  Continue low cholesterol diet and regular exercise Will follow-up  Other fatigue Chronic and unstable Will order - Hemoglobin A1c - VITAMIN D 25 Hydroxy (Vit-D Deficiency, Fractures) - Vitamin B12 - TSH - CBC with Differential/Platelet - Comprehensive metabolic panel with GFR Advised healthy diet and social involvement with community Will follow-up  Orders Placed This Encounter  Procedures   Hemoglobin A1c   VITAMIN D 25 Hydroxy (Vit-D Deficiency, Fractures)   Vitamin B12   TSH   CBC with Differential/Platelet   Comprehensive metabolic panel with GFR   Ambulatory referral to Neurology    Referral Priority:   Routine    Referral Type:   Consultation    Referral Reason:   Specialty Services Required    Requested Specialty:   Neurology    Number of Visits Requested:   1    Return in about 4 weeks (around 10/03/2023) for chronic  disease f/u.   The patient was advised to call back or seek an in-person evaluation if the symptoms worsen or if the condition fails to improve as anticipated.  I discussed the assessment and treatment plan with the patient. The patient was provided an opportunity to ask questions and all were answered. The patient agreed with the plan and demonstrated an understanding of the instructions.  I, Cally Nygard, PA-C have reviewed all documentation for this visit. The documentation on 09/05/2023  for the exam, diagnosis, procedures, and orders are all accurate and complete.  Blane Bunting, [facility], MMS [facility] [phone] (phone) [phone] (fax)  [facility]

## 2023-09-05 NOTE — Progress Notes (Signed)
 Established patient visit  Patient: Justin Warren   DOB: 03/10/1946   78 y.o. Male  MRN: [id number] Visit Date: 09/05/2023  Today's healthcare provider: Blane Bunting, PA-C   No chief complaint on file.  Subjective       Discussed the use of AI scribe software for clinical note transcription with the patient, who gave verbal consent to proceed.  History of Present Illness        08/18/2023   11:08 AM 07/28/2023   11:23 AM 07/13/2023   11:49 AM  Depression screen PHQ 2/9  Decreased Interest 0 0 1  Down, Depressed, Hopeless 1 1 2   PHQ - 2 Score 1 1 3   Altered sleeping   2  Tired, decreased energy  1 1  Change in appetite  1 2  Feeling bad or failure about yourself   0 0  Trouble concentrating  1 1  Moving slowly or fidgety/restless  0 0  Suicidal thoughts  0 0  PHQ-9 Score   9       No data to display          Medications: Outpatient Medications Prior to Visit  Medication Sig   acetaminophen  (TYLENOL ) 650 MG CR tablet Take 650 mg by mouth every 8 (eight) hours as needed for pain.   atorvastatin  (LIPITOR ) 80 MG tablet Take 1 tablet (80 mg total) by mouth daily.   busPIRone  (BUSPAR ) 7.5 MG tablet Take 1 tablet (7.5 mg total) by mouth 2 (two) times daily.   clopidogrel  (PLAVIX ) 75 MG tablet Take 1 tablet (75 mg total) by mouth daily. (Patient not taking: Reported on 09/05/2023)   dicyclomine  (BENTYL ) 10 MG capsule Take 1 capsule (10 mg total) by mouth 2 (two) times daily.   donepezil  (ARICEPT  ODT) 5 MG disintegrating tablet Take 1 tablet (5 mg total) by mouth at bedtime. (Patient not taking: Reported on 09/05/2023)   ELIQUIS  5 MG TABS tablet Take 1 tablet (5 mg total) by mouth 2 (two) times daily.   FLUoxetine  (PROZAC ) 10 MG capsule Take 1 capsule (10 mg total) by mouth daily.   losartan  (COZAAR ) 50 MG tablet Take 1 tablet (50 mg total) by mouth daily.   metoprolol  succinate (TOPROL -XL) 25 MG 24 hr tablet Take 1 tablet (25 mg total) by mouth daily.   omeprazole   (PRILOSEC) 40 MG capsule Take 1 capsule (40 mg total) by mouth daily.   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   No facility-administered medications prior to visit.    Review of Systems All negative Except see HPI       Objective    There were no vitals taken for this visit.    Physical Exam   No results found for any visits on 09/05/23.      Assessment and Plan Assessment & Plan     No orders of the defined types were placed in this encounter.   No follow-ups on file.   The patient was advised to call back or seek an in-person evaluation if the symptoms worsen or if the condition fails to improve as anticipated.  I discussed the assessment and treatment plan with the patient. The patient was provided an opportunity to ask questions and all were answered. The patient agreed with the plan and demonstrated an understanding of the instructions.  I, [place] Rollyson, PA-C have reviewed all documentation for this visit. The documentation on 09/05/2023  for the exam, diagnosis, procedures, and orders are  all accurate and complete.  Blane Bunting, [facility], MMS [facility] [phone] (phone) [phone] (fax)  [facility]

## 2023-09-06 ENCOUNTER — Ambulatory Visit: Payer: Self-pay | Admitting: Physician Assistant

## 2023-09-06 LAB — COMPREHENSIVE METABOLIC PANEL WITH GFR
ALT: 19 IU/L (ref 0–44)
AST: 23 IU/L (ref 0–40)
Albumin: 4.5 g/dL (ref 3.8–4.8)
Alkaline Phosphatase: 142 IU/L — ABNORMAL HIGH (ref 44–121)
BUN/Creatinine Ratio: 10 (ref 10–24)
BUN: 10 mg/dL (ref 8–27)
Bilirubin Total: 0.8 mg/dL (ref 0.0–1.2)
CO2: 20 mmol/L (ref 20–29)
Calcium: 9.9 mg/dL (ref 8.6–10.2)
Chloride: 103 mmol/L (ref 96–106)
Creatinine, Ser: 0.96 mg/dL (ref 0.76–1.27)
Globulin, Total: 2.1 g/dL (ref 1.5–4.5)
Glucose: 103 mg/dL — ABNORMAL HIGH (ref 70–99)
Potassium: 3.7 mmol/L (ref 3.5–5.2)
Sodium: 140 mmol/L (ref 134–144)
Total Protein: 6.6 g/dL (ref 6.0–8.5)
eGFR: 80 mL/min/{1.73_m2} (ref >59)

## 2023-09-06 LAB — CBC WITH DIFFERENTIAL/PLATELET
Basophils Absolute: 0.1 10*3/uL (ref 0.0–0.2)
Basos: 1 %
EOS (ABSOLUTE): 0.1 10*3/uL (ref 0.0–0.4)
Eos: 1 %
Hematocrit: 46.5 % (ref 37.5–51.0)
Hemoglobin: 15.2 g/dL (ref 13.0–17.7)
Immature Grans (Abs): 0 10*3/uL (ref 0.0–0.1)
Immature Granulocytes: 0 %
Lymphocytes Absolute: 1.9 10*3/uL (ref 0.7–3.1)
Lymphs: 27 %
MCH: 28.7 pg (ref 26.6–33.0)
MCHC: 32.7 g/dL (ref 31.5–35.7)
MCV: 88 fL (ref 79–97)
Monocytes Absolute: 0.8 10*3/uL (ref 0.1–0.9)
Monocytes: 11 %
Neutrophils Absolute: 4 10*3/uL (ref 1.4–7.0)
Neutrophils: 60 %
Platelets: 209 10*3/uL (ref 150–450)
RBC: 5.29 x10E6/uL (ref 4.14–5.80)
RDW: 14.2 % (ref 11.6–15.4)
WBC: 6.8 10*3/uL (ref 3.4–10.8)

## 2023-09-06 LAB — TSH: TSH: 1.73 u[IU]/mL (ref 0.450–4.500)

## 2023-09-06 LAB — HEMOGLOBIN A1C
Est. average glucose Bld gHb Est-mCnc: 105 mg/dL
Hgb A1c MFr Bld: 5.3 % (ref 4.8–5.6)

## 2023-09-06 LAB — VITAMIN B12: Vitamin B-12: 402 pg/mL (ref 232–1245)

## 2023-09-06 LAB — VITAMIN D 25 HYDROXY (VIT D DEFICIENCY, FRACTURES): Vit D, 25-Hydroxy: 32.8 ng/mL (ref 30.0–100.0)

## 2023-09-08 ENCOUNTER — Other Ambulatory Visit: Payer: Self-pay | Admitting: *Deleted

## 2023-09-08 NOTE — Patient Instructions (Signed)
 Visit Information  Thank you for taking time to visit with me today. Please don't hesitate to contact me if I can be of assistance to you before our next scheduled appointment.  Patient has met all care management goals. Care Management case will be closed. Patient has been provided contact information should new needs arise.   Please call the care guide team at [phone] if you need to cancel, schedule, or reschedule an appointment.   Please call the Suicide and Crisis Lifeline: 988 if you are experiencing a Mental Health or Behavioral Health Crisis or need someone to talk to.  Eliyana Pagliaro, LCSW [place]  [facility], [facility] Licensed Clinical Social Worker  Direct Dial: [phone]

## 2023-09-08 NOTE — Patient Outreach (Signed)
 Complex Care Management   Visit Note  09/08/2023  Name:  Justin Warren MRN: [id number] DOB: 1945-10-21  Situation: Referral received for Complex Care Management related to Mental/Behavioral Health diagnosis Grief and loss I obtained verbal consent from Patient.  Visit completed with patient  on the phone  Background:   Past Medical History:  Diagnosis Date   Anxiety    Bradycardia    GERD (gastroesophageal reflux disease)    History of kidney stones    Hyperlipidemia    Hypertension    Kidney stone    Neuropathy     Assessment: Patient Reported Symptoms:  Cognitive Cognitive Status: Alert and oriented to person, place, and time, Insightful and able to interpret abstract concepts, Normal speech and language skills Cognitive/Intellectual Conditions Management [RPT]: None reported or documented in medical history or problem list      Neurological Neurological Review of Symptoms: Other: [place] Neurological Symptoms/Conditions [RPT]: Per patient, patient has Neuropathy in feet Neurological Comment: follow up with Neurologist  in  August  due to nerve pain in feet  HEENT HEENT Symptoms Reported: No symptoms reported      Cardiovascular Cardiovascular Symptoms Reported: No symptoms reported    Respiratory Respiratory Symptoms Reported: No symptoms reported    Endocrine Patient reports the following symptoms related to hypoglycemia or hyperglycemia : No symptoms reported    Gastrointestinal Gastrointestinal Symptoms Reported: No symptoms reported      Genitourinary Genitourinary Symptoms Reported: No symptoms reported    Integumentary Integumentary Symptoms Reported: No symptoms reported    Musculoskeletal Musculoskelatal Symptoms Reviewed: Unsteady gait Additional Musculoskeletal Details: per patoient        Psychosocial Additional Psychological Details: patient reports            09/08/2023   11:17 AM  Depression screen PHQ 2/9  Decreased Interest 0  Down,  Depressed, Hopeless 0  PHQ - 2 Score 0    There were no vitals filed for this visit.  Medications Reviewed Today     Reviewed by Ave Leisure, LCSW (Social Worker) on 09/08/23 at 1839  Med List Status: <None>   Medication Order Taking? Sig Documenting Provider Last Dose Status Informant  acetaminophen  (TYLENOL ) 650 MG CR tablet [id number] Yes Take 650 mg by mouth every 8 (eight) hours as needed for pain. [provider]  Active Self  atorvastatin  (LIPITOR ) 80 MG tablet [id number] Yes Take 1 tablet (80 mg total) by mouth daily. Carlean Charter, DO  Active   busPIRone  (BUSPAR ) 7.5 MG tablet [id number] Yes Take 1 tablet (7.5 mg total) by mouth 2 (two) times daily. Carlean Charter, DO  Active   clopidogrel  (PLAVIX ) 75 MG tablet [id number]  Take 1 tablet (75 mg total) by mouth daily.  Patient not taking: Reported on 09/08/2023   Carlean Charter, DO  Active   dicyclomine  (BENTYL ) 10 MG capsule [id number]  Take 1 capsule (10 mg total) by mouth 2 (two) times daily. Carlean Charter, DO  Active   donepezil  (ARICEPT  ODT) 5 MG disintegrating tablet [id number]  Take 1 tablet (5 mg total) by mouth at bedtime.  Patient not taking: Reported on 09/05/2023   Carlean Charter, DO  Active   ELIQUIS  5 MG TABS tablet [id number] Yes Take 1 tablet (5 mg total) by mouth 2 (two) times daily. Carlean Charter, DO  Active   FLUoxetine  (PROZAC ) 10 MG capsule [id number] Yes Take 1 capsule (10 mg total) by mouth daily. Carlean Charter, DO  Active  losartan  (COZAAR ) 50 MG tablet [id number] Yes Take 1 tablet (50 mg total) by mouth daily. Carlean Charter, DO  Active   metoprolol  succinate (TOPROL -XL) 25 MG 24 hr tablet [id number] Yes Take 1 tablet (25 mg total) by mouth daily. Carlean Charter, DO  Active   omeprazole  (PRILOSEC) 40 MG capsule [id number] Yes Take 1 capsule (40 mg total) by mouth daily. Iona Manis T, FNP  Active   Tafamidis  61 MG CAPS [id number] Yes Take 1 capsule (61 mg total) by mouth daily. Carlean Charter,  DO  Active             Recommendation:   PCP Follow-up Continued follow up with Grief Share  Follow Up Plan:   Patient has met all care management goals. Care Management case will be closed. Patient has been provided contact information should new needs arise.     Wally Behan, LCSW [place]  [facility], [facility] Licensed Clinical Social Worker  Direct Dial: [phone]

## 2023-09-09 NOTE — Telephone Encounter (Signed)
 Copied from CRM [id number]. Topic: Clinical - Lab/Test Results >> Sep 09, 2023  1:33 PM Geneva B wrote: Reason for CRM: patient would like clarity on lab results please call patient back

## 2023-09-12 ENCOUNTER — Ambulatory Visit: Payer: Self-pay

## 2023-09-12 ENCOUNTER — Telehealth: Payer: Self-pay

## 2023-09-12 NOTE — Telephone Encounter (Signed)
 Forward to Dr. Athena Bland.

## 2023-09-12 NOTE — Telephone Encounter (Unsigned)
 Copied from CRM [id number]. Topic: Clinical - Lab/Test Results >> Sep 09, 2023  1:33 PM Geneva B wrote: Reason for CRM: patient would like clarity on lab results please call patient back >> Sep 09, 2023  3:49 PM Corin V wrote: For clarification, Specialist read results to patient and patient is unsure what alkaline phosphatase is and what can cause elevation. Please call back to answer clinical questions that patient has.

## 2023-09-12 NOTE — Telephone Encounter (Signed)
 FYI Only or Action Required?: Action required by provider  Patient was last seen in primary care on 04/20/2023 by Carlean Charter, DO. Called Nurse Triage reporting Lab review. Symptoms began today. Interventions attempted: Other: NA. Symptoms are: NA.  Triage Disposition: Call PCP When Office is Open  Patient/caregiver understands and will follow disposition?: No, wishes to speak with PCP   Copied from CRM [id number]. Topic: Clinical - Lab/Test Results >> Sep 12, 2023 10:50 AM [place] wrote: Reason for CRM: Further questions about labs. elevated alkaline phsophatase Reason for Disposition  Caller requesting routine or non-urgent lab result  Answer Assessment - Initial Assessment Questions 1. REASON FOR CALL or QUESTION: What is your reason for calling today? or How can I best help you? or What question do you have that I can help answer?     What does elevated alk phos mean and what do we need to do about it.  2. CALLER: Document the source of call. (e.g., laboratory, patient).     Patient  Additional info: Request follow up call (no my chart messaging) from PCP re: elevated alk phos education and plan. Aware of note that will recheck at follow up.  Protocols used: PCP Call - No Triage-A-AH

## 2023-09-20 NOTE — Telephone Encounter (Signed)
 Spoke with patient and was made aware. Pt verbalized understanding.

## 2023-10-05 ENCOUNTER — Ambulatory Visit: Admitting: Family Medicine

## 2023-10-05 ENCOUNTER — Encounter: Payer: Self-pay | Admitting: Family Medicine

## 2023-10-05 VITALS — BP 114/73 | HR 80 | Ht 69.0 in | Wt 167.3 lb

## 2023-10-05 DIAGNOSIS — H9193 Unspecified hearing loss, bilateral: Secondary | ICD-10-CM

## 2023-10-05 DIAGNOSIS — I2510 Atherosclerotic heart disease of native coronary artery without angina pectoris: Secondary | ICD-10-CM | POA: Diagnosis not present

## 2023-10-05 DIAGNOSIS — R2689 Other abnormalities of gait and mobility: Secondary | ICD-10-CM | POA: Diagnosis not present

## 2023-10-05 DIAGNOSIS — F4329 Adjustment disorder with other symptoms: Secondary | ICD-10-CM

## 2023-10-05 DIAGNOSIS — Z950 Presence of cardiac pacemaker: Secondary | ICD-10-CM

## 2023-10-05 DIAGNOSIS — Z955 Presence of coronary angioplasty implant and graft: Secondary | ICD-10-CM

## 2023-10-05 DIAGNOSIS — E8582 Wild-type transthyretin-related (ATTR) amyloidosis: Secondary | ICD-10-CM

## 2023-10-05 MED ORDER — MIRTAZAPINE 15 MG PO TABS
15.0000 mg | ORAL_TABLET | Freq: Every day | ORAL | 1 refills | Status: AC
Start: 2023-10-05 — End: ?

## 2023-10-05 NOTE — Progress Notes (Signed)
 Established patient visit   Patient: Justin Warren   DOB: Sep 11, 1945   78 y.o. Male  MRN: [id number] Visit Date: 10/05/2023  Today's healthcare provider: LAURAINE LOISE BUOY, DO   Chief Complaint  Patient presents with   Follow-up   Subjective    HPI Justin Warren is an 78 year old male with coronary artery disease and amyloidosis who presents with balance issues and recent falls.  He has been experiencing significant balance issues, leading to recent falls. He describes an incident where he fell while trying to fix a gate in his yard, requiring assistance to get up. He notes wearing slippers for several years and mentions that no recent medical visits have addressed his balance or neuropathy.  He has a history of coronary artery disease with three blockages, for which he received two stents. He is currently on Eliquis , having previously been on both Eliquis  and Plavix , but Plavix  was recently discontinued. He also has a pacemaker, which was checked recently, and recalls a past episode of severe bradycardia prior to its placement.  He is on Vyndamax  for amyloidosis affecting his heart, a condition diagnosed at [facility]. He expresses surprise at the high cost of the medication, which he receives at no cost due to his income level.  Since the passing of his wife, Rock, in September 2024, he has experienced a lack of appetite and significant weight loss, dropping from 195 pounds to 167 pounds. He reports eating very little, with a small salad and a honey bun being his only meals on a recent day.  He is involved in a grief share program but feels it is not significantly helping him, although others have noticed a positive change. He has been on fluoxetine  for many years, initially prescribed to help him quit smoking.  His social activities are limited; he occasionally goes out to eat with friends but otherwise does not engage in many activities. He has access to a [employer] and receives  newsletters from a senior center but does not participate in their activities.      Medications: Outpatient Medications Prior to Visit  Medication Sig Note   acetaminophen  (TYLENOL ) 650 MG CR tablet Take 650 mg by mouth every 8 (eight) hours as needed for pain.    atorvastatin  (LIPITOR ) 80 MG tablet Take 1 tablet (80 mg total) by mouth daily.    busPIRone  (BUSPAR ) 7.5 MG tablet Take 1 tablet (7.5 mg total) by mouth 2 (two) times daily.    dicyclomine  (BENTYL ) 10 MG capsule Take 1 capsule (10 mg total) by mouth 2 (two) times daily.    donepezil  (ARICEPT  ODT) 5 MG disintegrating tablet Take 1 tablet (5 mg total) by mouth at bedtime. (Patient not taking: Reported on 09/05/2023)    ELIQUIS  5 MG TABS tablet Take 1 tablet (5 mg total) by mouth 2 (two) times daily.    FLUoxetine  (PROZAC ) 10 MG capsule Take 1 capsule (10 mg total) by mouth daily.    losartan  (COZAAR ) 50 MG tablet Take 1 tablet (50 mg total) by mouth daily.    metoprolol  succinate (TOPROL -XL) 25 MG 24 hr tablet Take 1 tablet (25 mg total) by mouth daily.    omeprazole  (PRILOSEC) 40 MG capsule Take 1 capsule (40 mg total) by mouth daily.    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    [DISCONTINUED] clopidogrel  (PLAVIX ) 75 MG tablet Take 1 tablet (75 mg total) by mouth daily. (Patient  not taking: Reported on 09/08/2023) 10/11/2023: Specialist   No facility-administered medications prior to visit.        Objective    BP 114/73 (BP Location: Left Arm, Patient Position: Sitting, Cuff Size: Normal)   Pulse 80   Ht 5' 9 (1.753 m)   Wt 167 lb 4.8 oz (75.9 kg)   SpO2 98%   BMI 24.71 kg/m     Physical Exam Vitals and nursing note reviewed.  Constitutional:      General: He is not in acute distress.    Appearance: Normal appearance.  HENT:     Head: Normocephalic and atraumatic.  Eyes:     General: No scleral icterus.    Conjunctiva/sclera: Conjunctivae normal.  Cardiovascular:     Rate and Rhythm:  Normal rate.     Heart sounds: Normal heart sounds.  Pulmonary:     Effort: Pulmonary effort is normal. No respiratory distress.     Breath sounds: Normal breath sounds. No stridor. No wheezing or rhonchi.  Neurological:     Mental Status: He is alert and oriented to person, place, and time. Mental status is at baseline.  Psychiatric:        Mood and Affect: Mood normal.        Behavior: Behavior normal.      No results found for any visits on 10/05/23.  Assessment & Plan    Grief reaction with prolonged bereavement -     Mirtazapine ; Take 1 tablet (15 mg total) by mouth at bedtime.  Dispense: 30 tablet; Refill: 1  Balance problem  Coronary artery disease involving native coronary artery of native heart without angina pectoris  Status post insertion of drug-eluting stent into right coronary artery for coronary artery disease  Status post placement of cardiac pacemaker  Hearing difficulty of both ears  Wild-type transthyretin-related (ATTR) amyloidosis (HCC)      Grief reaction with prolonged bereavement; weight loss Significant weight loss and lack of appetite since wife's passing. On fluoxetine , open to mirtazapine . - Continue fluoxetine . - Start mirtazapine .  Consider discontinuing fluoxetine  at a later date based on patient's response.  Balance problem Experiences balance issues and falls. Neuropathy present. Neurology referral made. - Ensure follow-up with neurology for balance and neuropathy evaluation.  Coronary artery disease with stents Coronary artery disease with three blockages (proximal RCA, mid RCA and distal RCA); two stents placed in 2022. On Eliquis , Plavix  discontinued. - Continue Eliquis .  Hearing difficulty Hearing aids need evaluation. Considering referral to local audiologist. - Encourage him to take hearing aids to the [facility] for evaluation. - Consider referral to a local audiologist.  Pacemaker management Pacemaker placed in 2022, recently  checked with no issues.  Cardiac (ATTR) amyloidosis  On Vyndamax , covered due to income qualifications.  General Health Maintenance Due for Medicare annual wellness visit. Has access to [employer] and [employer] but not participating. - Schedule Medicare annual wellness visit. - Encourage participation in [employer] or [employer] activities.    Follow-up Upcoming cardiology and neurology appointments. Follow-up in 4-8 weeks for mirtazapine  response. - Attend cardiology appointment for echocardiogram. - Attend neurology appointment next month. - Follow up in 4-8 weeks to assess response to mirtazapine .   Return in about 6 weeks (around 11/16/2023) for Anx/Dep, Chronic f/u.      I discussed the assessment and treatment plan with the patient  The patient was provided an opportunity to ask questions and all were answered. The patient agreed with the plan and demonstrated an understanding  of the instructions.   The patient was advised to call back or seek an in-person evaluation if the symptoms worsen or if the condition fails to improve as anticipated.    LAURAINE LOISE BUOY, DO  [facility] [phone] (phone) [phone] (fax)  [facility]

## 2023-10-11 ENCOUNTER — Telehealth: Payer: Self-pay

## 2023-10-11 NOTE — Telephone Encounter (Signed)
 Copied from CRM [id number]. Topic: Clinical - Medication Question >> Oct 11, 2023  9:05 AM Tobias CROME wrote: Reason for CRM: Patient requesting clarification on medication. Patient states Dr. Donzella started him on mirtazapine  15mg  tablet, patient inquiring if he should keep taking the fluoxetine  while taking new prescription.   Patient requesting callback, [phone]

## 2023-10-11 NOTE — Telephone Encounter (Signed)
 Spoke with pt and advised provider recommendation. Patient verbalized understanding.

## 2023-10-11 NOTE — Telephone Encounter (Signed)
 Please review and advise. chart is still open.

## 2023-11-16 ENCOUNTER — Ambulatory Visit: Admitting: Family Medicine

## 2023-11-21 ENCOUNTER — Telehealth: Payer: Self-pay | Admitting: Family Medicine

## 2023-11-21 NOTE — Telephone Encounter (Signed)
 Patient came in stating he cannot take the Mirtazapine  as it knocked him on  his butt for 1.5 days.  He said he didn't take anymore.  He thought he had an appt today but its for 12/05/23.

## 2023-12-05 ENCOUNTER — Ambulatory Visit: Admitting: Family Medicine

## 2023-12-05 DIAGNOSIS — R5383 Other fatigue: Secondary | ICD-10-CM | POA: Insufficient documentation

## 2024-02-13 DIAGNOSIS — D2261 Melanocytic nevi of right upper limb, including shoulder: Secondary | ICD-10-CM | POA: Diagnosis not present

## 2024-02-13 DIAGNOSIS — D2262 Melanocytic nevi of left upper limb, including shoulder: Secondary | ICD-10-CM | POA: Diagnosis not present

## 2024-02-13 DIAGNOSIS — D485 Neoplasm of uncertain behavior of skin: Secondary | ICD-10-CM | POA: Diagnosis not present

## 2024-02-13 DIAGNOSIS — Z85828 Personal history of other malignant neoplasm of skin: Secondary | ICD-10-CM | POA: Diagnosis not present

## 2024-02-13 DIAGNOSIS — L570 Actinic keratosis: Secondary | ICD-10-CM | POA: Diagnosis not present

## 2024-02-13 DIAGNOSIS — D2272 Melanocytic nevi of left lower limb, including hip: Secondary | ICD-10-CM | POA: Diagnosis not present

## 2024-02-13 DIAGNOSIS — C44722 Squamous cell carcinoma of skin of right lower limb, including hip: Secondary | ICD-10-CM | POA: Diagnosis not present

## 2024-02-13 DIAGNOSIS — D225 Melanocytic nevi of trunk: Secondary | ICD-10-CM | POA: Diagnosis not present

## 2024-02-20 DIAGNOSIS — D0471 Carcinoma in situ of skin of right lower limb, including hip: Secondary | ICD-10-CM | POA: Diagnosis not present

## 2024-02-29 ENCOUNTER — Ambulatory Visit

## 2024-03-12 ENCOUNTER — Other Ambulatory Visit (HOSPITAL_COMMUNITY): Payer: Self-pay

## 2024-03-16 ENCOUNTER — Telehealth: Payer: Self-pay

## 2024-03-16 ENCOUNTER — Other Ambulatory Visit (HOSPITAL_COMMUNITY): Payer: Self-pay

## 2024-03-16 NOTE — Telephone Encounter (Signed)
 Pharmacy Patient Advocate Encounter   Received notification from Onbase that prior authorization for Vyndamax  61 caps is required/requested.   Insurance verification completed.   The patient is insured through [place].    Per test claim: Refill too soon. PA is not needed at this time. Medication was filled 03/06/24. Next eligible fill date is 03/29/24.

## 2024-03-17 ENCOUNTER — Other Ambulatory Visit: Payer: Self-pay | Admitting: Family Medicine

## 2024-03-17 DIAGNOSIS — I10 Essential (primary) hypertension: Secondary | ICD-10-CM

## 2024-03-17 DIAGNOSIS — I252 Old myocardial infarction: Secondary | ICD-10-CM

## 2024-03-19 NOTE — Telephone Encounter (Signed)
 Pt aware of recommendation. Pt verbalized understanding

## 2024-05-02 ENCOUNTER — Ambulatory Visit

## 2024-05-02 VITALS — BP 114/70 | Ht 69.0 in | Wt 171.8 lb

## 2024-05-02 DIAGNOSIS — Z0000 Encounter for general adult medical examination without abnormal findings: Secondary | ICD-10-CM

## 2024-05-02 NOTE — Progress Notes (Cosign Needed)
 "  Chief Complaint  Patient presents with   Medicare Wellness     Subjective:   ADAMS HINCH is a 79 y.o. male who presents for a Medicare Annual Wellness Visit.  Visit info / Clinical Intake: Medicare Wellness Visit Type:: Subsequent Annual Wellness Visit Persons participating in visit and providing information:: patient Medicare Wellness Visit Mode:: In-person (required for WTM) Interpreter Needed?: No Pre-visit prep was completed: yes AWV questionnaire completed by patient prior to visit?: no Living arrangements:: (!) lives alone Patient's Overall Health Status Rating: good Typical amount of pain: none Does pain affect daily life?: no Are you currently prescribed opioids?: no  Dietary Habits and Nutritional Risks How many meals a day?: (!) 1 (HAS NO APPETITE) Eats fruit and vegetables daily?: (!) no (LIKES VEGGIES) Most meals are obtained by: preparing own meals In the last 2 weeks, have you had any of the following?: none Diabetic:: no  Functional Status Activities of Daily Living (to include ambulation/medication): Independent Ambulation: Independent with device- listed below Home Assistive Devices/Equipment: Cane Medication Administration: Needs assistance (comment) Is this a change from baseline?: Pre-admission baseline Home Management (perform basic housework or laundry): Independent Manage your own finances?: yes Primary transportation is: driving Concerns about vision?: no *vision screening is required for WTM* (READERS- PATTY VISION) Concerns about hearing?: (!) yes Uses hearing aids?: (!) yes (RARELY WEARS) Hear whispered voice?: (!) no *in-person visit only*  Fall Screening Falls in the past year?: 1 Number of falls in past year: 1 Was there an injury with Fall?: 0 Fall Risk Category Calculator: 2 Patient Fall Risk Level: Moderate Fall Risk  Fall Risk Patient at Risk for Falls Due to: Mental status change; Impaired mobility Fall risk Follow up:  Falls evaluation completed; Falls prevention discussed  Home and Transportation Safety: All rugs have non-skid backing?: yes All stairs or steps have railings?: yes Grab bars in the bathtub or shower?: yes Have non-skid surface in bathtub or shower?: yes Good home lighting?: yes Regular seat belt use?: yes Hospital stays in the last year:: no  Cognitive Assessment Difficulty concentrating, remembering, or making decisions? : no (PER PT) Will 6CIT or Mini Cog be Completed: yes What year is it?: 0 points What month is it?: 0 points Give patient an address phrase to remember (5 components): [address] [place], [place] About what time is it?: 0 points Count backwards from 20 to 1: 0 points Say the months of the year in reverse: 0 points Repeat the address phrase from earlier: 0 points 6 CIT Score: 0 points  Advance Directives (For Healthcare) Does Patient Have a Medical Advance Directive?: No Type of Advance Directive: Healthcare Power of [place]; Living will Copy of Healthcare Power of Attorney in Chart?: No - copy requested Copy of Living Will in Chart?: No - copy requested Would patient like information on creating a medical advance directive?: No - Patient declined  Reviewed/Updated  Reviewed/Updated: Reviewed All (Medical, Surgical, Family, Medications, Allergies, Care Teams, Patient Goals)    Allergies (verified) Amoxicillin-pot clavulanate and Prednisone    Current Medications (verified) Outpatient Encounter Medications as of 05/02/2024  Medication Sig   acetaminophen  (TYLENOL ) 650 MG CR tablet Take 650 mg by mouth every 8 (eight) hours as needed for pain.   atorvastatin  (LIPITOR ) 80 MG tablet Take 1 tablet (80 mg total) by mouth daily.   busPIRone  (BUSPAR ) 7.5 MG tablet Take 1 tablet (7.5 mg total) by mouth 2 (two) times daily.   Cholecalciferol (VITAMIN D3) 10 MCG (400  UNIT) tablet Take 400 Units by mouth daily. (Patient taking differently: Take 400 Units by mouth  daily. TAKES WHEN THINKS OF IT)   dicyclomine  (BENTYL ) 10 MG capsule Take 1 capsule (10 mg total) by mouth 2 (two) times daily.   donepezil  (ARICEPT  ODT) 5 MG disintegrating tablet Take 1 tablet (5 mg total) by mouth at bedtime.   ELIQUIS  5 MG TABS tablet Take 1 tablet (5 mg total) by mouth 2 (two) times daily.   FLUoxetine  (PROZAC ) 10 MG capsule Take 1 capsule (10 mg total) by mouth daily.   losartan  (COZAAR ) 50 MG tablet Take 1 tablet (50 mg total) by mouth daily.   metoprolol  succinate (TOPROL -XL) 25 MG 24 hr tablet Take 1 tablet (25 mg total) by mouth daily.   mirtazapine  (REMERON ) 15 MG tablet Take 1 tablet (15 mg total) by mouth at bedtime.   omeprazole  (PRILOSEC) 40 MG capsule Take 1 capsule (40 mg total) by mouth daily.   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   No facility-administered encounter medications on file as of 05/02/2024.    History: Past Medical History:  Diagnosis Date   Anxiety    Bradycardia    GERD (gastroesophageal reflux disease)    History of kidney stones    Hyperlipidemia    Hypertension    Kidney stone    Neuropathy    Past Surgical History:  Procedure Laterality Date   APPENDECTOMY  1994   BACK SURGERY     CARPAL TUNNEL RELEASE Right 2012   CARPAL TUNNEL RELEASE Left 02/06/2020   Procedure: CARPAL TUNNEL RELEASE;  Surgeon: Cleotilde Barrio, MD;  Location: [facility] ORS;  Service: Orthopedics;  Laterality: Left;  BLOCK   CATARACT EXTRACTION, BILATERAL  2019   COLONOSCOPY WITH PROPOFOL  N/A 07/09/2020   Procedure: COLONOSCOPY WITH PROPOFOL ;  Surgeon: Unk Corinn Skiff, MD;  Location: [facility] ENDOSCOPY;  Service: Gastroenterology;  Laterality: N/A;   CORONARY STENT INTERVENTION N/A 02/27/2021   Procedure: CORONARY STENT INTERVENTION;  Surgeon: Florencio Cara BIRCH, MD;  Location: [facility] INVASIVE CV LAB;  Service: Cardiovascular;  Laterality: N/A;   CYSTOSCOPY W/ RETROGRADES Right 11/30/2019   Procedure: CYSTOSCOPY WITH RETROGRADE PYELOGRAM;   Surgeon: Francisca Redell BROCKS, MD;  Location: [facility] ORS;  Service: Urology;  Laterality: Right;   CYSTOSCOPY/URETEROSCOPY/HOLMIUM LASER/STENT PLACEMENT Right 11/30/2019   Procedure: CYSTOSCOPY/URETEROSCOPY/HOLMIUM LASER/STENT PLACEMENT;  Surgeon: Francisca Redell BROCKS, MD;  Location: [facility] ORS;  Service: Urology;  Laterality: Right;   ESOPHAGOGASTRODUODENOSCOPY (EGD) WITH PROPOFOL  N/A 07/09/2020   Procedure: ESOPHAGOGASTRODUODENOSCOPY (EGD) WITH PROPOFOL ;  Surgeon: Unk Corinn Skiff, MD;  Location: [facility] ENDOSCOPY;  Service: Gastroenterology;  Laterality: N/A;   EYE SURGERY     INSERT / REPLACE / REMOVE PACEMAKER     JOINT REPLACEMENT     LEFT HEART CATH AND CORONARY ANGIOGRAPHY N/A 02/27/2021   Procedure: LEFT HEART CATH AND CORONARY ANGIOGRAPHY possible PCI and stent;  Surgeon: Florencio Cara BIRCH, MD;  Location: [facility] INVASIVE CV LAB;  Service: Cardiovascular;  Laterality: N/A;   LUMBAR LAMINECTOMIES  1976 & 1978   Dr. 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   SIGMOIDOSCOPY  2010   TRIGGER FINGER RELEASE Left 02/06/2020   Procedure: RELEASE TRIGGER FINGER/A-1 PULLEY;  Surgeon: Cleotilde Barrio, MD;  Location: [facility] ORS;  Service: Orthopedics;  Laterality: Left;   Family  History  Problem Relation Age of Onset   Dementia Mother    GER disease Mother    Hypertension Father    Arrhythmia Sister    Heart murmur Sister    Social History   Occupational History   Occupation: retired  Tobacco Use   Smoking status: Former    Current packs/day: 2.00    Average packs/day: 2.0 packs/day for 30.0 years (60.0 ttl pk-yrs)    Types: Cigarettes   Smokeless tobacco: Never   Tobacco comments:    quit in 1991-1992  Vaping Use   Vaping status: Never Used  Substance and Sexual Activity   Alcohol  use: Not Currently     Alcohol /week: 0.0 standard drinks of alcohol     Comment: quit over 30 years ago   Drug use: No   Sexual activity: Yes    Birth control/protection: None   Tobacco Counseling Counseling given: Not Answered Tobacco comments: quit in 1991-1992  SDOH Screenings   Food Insecurity: No Food Insecurity (05/02/2024)  Housing: Low Risk (05/02/2024)  Transportation Needs: No Transportation Needs (05/02/2024)  Utilities: Not At Risk (05/02/2024)  Alcohol  Screen: Low Risk (07/07/2021)  Depression (PHQ2-9): Low Risk (05/02/2024)  Financial Resource Strain: Low Risk (07/05/2022)  Physical Activity: Inactive (05/02/2024)  Social Connections: Socially Isolated (05/02/2024)  Stress: No Stress Concern Present (05/02/2024)  Tobacco Use: Medium Risk (05/02/2024)  Health Literacy: Adequate Health Literacy (05/02/2024)   See flowsheets for full screening details  Depression Screen PHQ 2 & 9 Depression Scale- Over the past 2 weeks, how often have you been bothered by any of the following problems? Little interest or pleasure in doing things: 1 Feeling down, depressed, or hopeless (PHQ Adolescent also includes...irritable): 2 PHQ-2 Total Score: 3 Trouble falling or staying asleep, or sleeping too much: 0 Feeling tired or having little energy: 1 Poor appetite or overeating (PHQ Adolescent also includes...weight loss): 0 Feeling bad about yourself - or that you are a failure or have let yourself or your family down: 0 Trouble concentrating on things, such as reading the newspaper or watching television (PHQ Adolescent also includes...like school work): 0 Moving or speaking so slowly that other people could have noticed. Or the opposite - being so fidgety or restless that you have been moving around a lot more than usual: 0 Thoughts that you would be better off dead, or of hurting yourself in some way: 0 PHQ-9 Total Score: 4 If you checked off any problems, how difficult have these problems made it for you to do your work, take  care of things at home, or get along with other people?: Not difficult at all  Depression Treatment Depression Interventions/Treatment : EYV7-0 Score <4 Follow-up Not Indicated; Medication     Goals Addressed             This Visit's Progress    DIET - REDUCE SUGAR INTAKE               Objective:    Today's Vitals   05/02/24 1449  BP: 114/70  Weight: 171 lb 12.8 oz (77.9 kg)  Height: 5' 9 (1.753 m)   Body mass index is 25.37 kg/m.  Hearing/Vision screen No results found. Immunizations and Health Maintenance Health Maintenance  Topic Date Due   DTaP/Tdap/Td (1 - Tdap) Never done   Zoster Vaccines- Shingrix (1 of 2) 04/06/1962   Influenza Vaccine  10/28/2023   COVID-19 Vaccine (5 - 2025-26 season) 11/28/2023   Medicare Annual Wellness (AWV)  05/02/2025   Colonoscopy  07/09/2025   Pneumococcal Vaccine: 50+ Years  Completed   Meningococcal B Vaccine  Aged Out   Hepatitis C Screening  Discontinued        Assessment/Plan:  This is a routine wellness examination for Cane Beds.  Patient Care Team: Donzella Lauraine SAILOR, DO as PCP - General (Family Medicine) Patty, A. Robynn, MD as Consulting Physician (Ophthalmology) Bosie Vinie LABOR, MD as Consulting Physician (Cardiology) Leora Lynwood SAUNDERS, MD as Consulting Physician (Orthopedic Surgery) Lane Arthea BRAVO, MD as Referring Physician (Neurology)  I have personally reviewed and noted the following in the patients chart:   Medical and social history Use of alcohol , tobacco or illicit drugs  Current medications and supplements including opioid prescriptions. Functional ability and status Nutritional status Physical activity Advanced directives List of other physicians Hospitalizations, surgeries, and ER visits in previous 12 months Vitals Screenings to include cognitive, depression, and falls Referrals and appointments  No orders of the defined types were placed in this encounter.  In addition, I have reviewed and  discussed with patient certain preventive protocols, quality metrics, and best practice recommendations. A written personalized care plan for preventive services as well as general preventive health recommendations were provided to patient.   Jhonnie GORMAN Das, LPN   10/01/7971   Return in about 1 year (around 05/02/2025).  After Visit Summary: (In Person-Declined) Patient declined AVS at this time.  Nurse Notes: UTD ON SHOTS EXCEPT SHINGRIX; DECLINES FLU SHOT TODAY; AGED OUT OF COLONOSCOPY   "

## 2024-05-02 NOTE — Patient Instructions (Addendum)
 Justin Warren,  Thank you for taking the time for your Medicare Wellness Visit. I appreciate your continued commitment to your health goals. Please review the care plan we discussed, and feel free to reach out if I can assist you further.  Please note that Annual Wellness Visits do not include a physical exam. Some assessments may be limited, especially if the visit was conducted virtually. If needed, we may recommend an in-person follow-up with your provider.  Ongoing Care Seeing your primary care provider every 3 to 6 months helps us  monitor your health and provide consistent, personalized care. 05/30/24 @ 2:00 PM APPT FOR PHYSICAL W/ DR.PARDUE  Referrals If a referral was made during today's visit and you haven't received any updates within two weeks, please contact the referred provider directly to check on the status.  Recommended Screenings:  Health Maintenance  Topic Date Due   DTaP/Tdap/Td vaccine (1 - Tdap) Never done   Zoster (Shingles) Vaccine (1 of 2) 04/06/1962   Medicare Annual Wellness Visit  07/05/2023   Flu Shot  10/28/2023   COVID-19 Vaccine (5 - 2025-26 season) 11/28/2023   Colon Cancer Screening  07/09/2025   Pneumococcal Vaccine for age over 47  Completed   Meningitis B Vaccine  Aged Out   Hepatitis C Screening  Discontinued     Vision: Annual vision screenings are recommended for early detection of glaucoma, cataracts, and diabetic retinopathy. These exams can also reveal signs of chronic conditions such as diabetes and high blood pressure.  Dental: Annual dental screenings help detect early signs of oral cancer, gum disease, and other conditions linked to overall health, including heart disease and diabetes.  Please see the attached documents for additional preventive care recommendations.   NEXT AWV  05/08/25 @ 3:10 PM IN PERSON

## 2024-05-30 ENCOUNTER — Encounter: Admitting: Family Medicine

## 2025-05-08 ENCOUNTER — Ambulatory Visit
